# Patient Record
Sex: Male | Born: 1965
Health system: Southern US, Community
[De-identification: ages and names within clinical notes are randomized; demographics above are authoritative.]

## PROBLEM LIST (undated history)

## (undated) DIAGNOSIS — J449 Chronic obstructive pulmonary disease, unspecified: Secondary | ICD-10-CM

## (undated) DIAGNOSIS — J45909 Unspecified asthma, uncomplicated: Secondary | ICD-10-CM

## (undated) HISTORY — PX: KNEE ARTHROSCOPY: SUR90

## (undated) HISTORY — PX: HERNIA REPAIR: SHX51

## (undated) HISTORY — PX: OTHER SURGICAL HISTORY: SHX169

## (undated) HISTORY — PX: TONSILLECTOMY: SUR1361

---

## 2002-08-10 ENCOUNTER — Emergency Department (HOSPITAL_COMMUNITY): Admission: EM | Admit: 2002-08-10 | Discharge: 2002-08-10 | Payer: Self-pay | Admitting: Emergency Medicine

## 2003-01-12 ENCOUNTER — Emergency Department (HOSPITAL_COMMUNITY): Admission: EM | Admit: 2003-01-12 | Discharge: 2003-01-12 | Payer: Self-pay | Admitting: *Deleted

## 2003-01-12 ENCOUNTER — Encounter: Payer: Self-pay | Admitting: *Deleted

## 2003-01-18 ENCOUNTER — Emergency Department (HOSPITAL_COMMUNITY): Admission: EM | Admit: 2003-01-18 | Discharge: 2003-01-18 | Payer: Self-pay | Admitting: Emergency Medicine

## 2003-01-25 ENCOUNTER — Encounter: Payer: Self-pay | Admitting: Emergency Medicine

## 2003-01-25 ENCOUNTER — Emergency Department (HOSPITAL_COMMUNITY): Admission: EM | Admit: 2003-01-25 | Discharge: 2003-01-25 | Payer: Self-pay | Admitting: Emergency Medicine

## 2003-02-09 ENCOUNTER — Encounter: Payer: Self-pay | Admitting: Internal Medicine

## 2003-02-09 ENCOUNTER — Ambulatory Visit (HOSPITAL_COMMUNITY): Admission: RE | Admit: 2003-02-09 | Discharge: 2003-02-09 | Payer: Self-pay | Admitting: Internal Medicine

## 2003-08-27 ENCOUNTER — Encounter: Payer: Self-pay | Admitting: Emergency Medicine

## 2003-08-27 ENCOUNTER — Emergency Department (HOSPITAL_COMMUNITY): Admission: AD | Admit: 2003-08-27 | Discharge: 2003-08-27 | Payer: Self-pay | Admitting: Emergency Medicine

## 2004-02-07 ENCOUNTER — Emergency Department (HOSPITAL_COMMUNITY): Admission: EM | Admit: 2004-02-07 | Discharge: 2004-02-07 | Payer: Self-pay | Admitting: Emergency Medicine

## 2004-02-14 ENCOUNTER — Emergency Department (HOSPITAL_COMMUNITY): Admission: EM | Admit: 2004-02-14 | Discharge: 2004-02-14 | Payer: Self-pay | Admitting: Emergency Medicine

## 2007-12-28 ENCOUNTER — Ambulatory Visit: Payer: Self-pay | Admitting: Vascular Surgery

## 2011-04-25 ENCOUNTER — Ambulatory Visit: Payer: Self-pay | Admitting: Family Medicine

## 2011-09-21 DIAGNOSIS — K219 Gastro-esophageal reflux disease without esophagitis: Secondary | ICD-10-CM | POA: Insufficient documentation

## 2012-03-11 DIAGNOSIS — G8929 Other chronic pain: Secondary | ICD-10-CM | POA: Insufficient documentation

## 2012-03-11 DIAGNOSIS — M25579 Pain in unspecified ankle and joints of unspecified foot: Secondary | ICD-10-CM

## 2012-05-15 DIAGNOSIS — M19171 Post-traumatic osteoarthritis, right ankle and foot: Secondary | ICD-10-CM | POA: Insufficient documentation

## 2012-07-09 DIAGNOSIS — E669 Obesity, unspecified: Secondary | ICD-10-CM | POA: Insufficient documentation

## 2012-08-05 DIAGNOSIS — Z981 Arthrodesis status: Secondary | ICD-10-CM | POA: Insufficient documentation

## 2013-08-28 ENCOUNTER — Observation Stay: Payer: Self-pay | Admitting: Orthopaedic Surgery

## 2013-08-28 ENCOUNTER — Ambulatory Visit: Payer: Self-pay | Admitting: Orthopaedic Surgery

## 2013-08-28 LAB — COMPREHENSIVE METABOLIC PANEL
Albumin: 3.6 g/dL (ref 3.4–5.0)
Alkaline Phosphatase: 94 U/L (ref 50–136)
Anion Gap: 6 — ABNORMAL LOW (ref 7–16)
BUN: 15 mg/dL (ref 7–18)
Bilirubin,Total: 0.4 mg/dL (ref 0.2–1.0)
Calcium, Total: 9.2 mg/dL (ref 8.5–10.1)
Chloride: 103 mmol/L (ref 98–107)
Co2: 29 mmol/L (ref 21–32)
Creatinine: 0.99 mg/dL (ref 0.60–1.30)
EGFR (African American): >60
EGFR (Non-African Amer.): >60
Glucose: 136 mg/dL — ABNORMAL HIGH (ref 65–99)
Osmolality: 279 (ref 275–301)
Potassium: 3.8 mmol/L (ref 3.5–5.1)
SGOT(AST): 25 U/L (ref 15–37)
SGPT (ALT): 39 U/L (ref 12–78)
Sodium: 138 mmol/L (ref 136–145)
Total Protein: 7.2 g/dL (ref 6.4–8.2)

## 2013-08-28 LAB — CBC
HCT: 43.1 % (ref 40.0–52.0)
HGB: 14.8 g/dL (ref 13.0–18.0)
MCH: 29.8 pg (ref 26.0–34.0)
MCHC: 34.2 g/dL (ref 32.0–36.0)
MCV: 87 fL (ref 80–100)
Platelet: 345 10*3/uL (ref 150–440)
RBC: 4.95 10*6/uL (ref 4.40–5.90)
RDW: 13.1 % (ref 11.5–14.5)
WBC: 14.8 10*3/uL — ABNORMAL HIGH (ref 3.8–10.6)

## 2013-09-12 ENCOUNTER — Inpatient Hospital Stay: Payer: Self-pay | Admitting: Internal Medicine

## 2013-09-12 LAB — CBC
HCT: 45.3 % (ref 40.0–52.0)
HGB: 15.1 g/dL (ref 13.0–18.0)
MCH: 28.9 pg (ref 26.0–34.0)
MCHC: 33.3 g/dL (ref 32.0–36.0)
MCV: 87 fL (ref 80–100)
Platelet: 315 10*3/uL (ref 150–440)
RBC: 5.22 10*6/uL (ref 4.40–5.90)
RDW: 12.8 % (ref 11.5–14.5)
WBC: 23 10*3/uL — ABNORMAL HIGH (ref 3.8–10.6)

## 2013-09-12 LAB — BASIC METABOLIC PANEL
Anion Gap: 8 (ref 7–16)
BUN: 16 mg/dL (ref 7–18)
Calcium, Total: 9.1 mg/dL (ref 8.5–10.1)
Chloride: 96 mmol/L — ABNORMAL LOW (ref 98–107)
Co2: 26 mmol/L (ref 21–32)
Creatinine: 0.92 mg/dL (ref 0.60–1.30)
EGFR (African American): >60
EGFR (Non-African Amer.): >60
Glucose: 125 mg/dL — ABNORMAL HIGH (ref 65–99)
Osmolality: 263 (ref 275–301)
Potassium: 3.9 mmol/L (ref 3.5–5.1)
Sodium: 130 mmol/L — ABNORMAL LOW (ref 136–145)

## 2013-09-12 LAB — TROPONIN I: Troponin-I: <0.02 ng/mL

## 2013-09-12 LAB — SODIUM, URINE, RANDOM: Sodium, Urine Random: 35 mmol/L (ref 20–110)

## 2013-09-12 LAB — OSMOLALITY, URINE: Osmolality: 420 mOsm/kg

## 2013-09-13 LAB — COMPREHENSIVE METABOLIC PANEL
Albumin: 3.1 g/dL — ABNORMAL LOW (ref 3.4–5.0)
Alkaline Phosphatase: 84 U/L (ref 50–136)
Anion Gap: 8 (ref 7–16)
BUN: 18 mg/dL (ref 7–18)
Bilirubin,Total: 0.5 mg/dL (ref 0.2–1.0)
Calcium, Total: 9.1 mg/dL (ref 8.5–10.1)
Chloride: 99 mmol/L (ref 98–107)
Co2: 27 mmol/L (ref 21–32)
Creatinine: 0.95 mg/dL (ref 0.60–1.30)
EGFR (African American): >60
EGFR (Non-African Amer.): >60
Glucose: 191 mg/dL — ABNORMAL HIGH (ref 65–99)
Osmolality: 275 (ref 275–301)
Potassium: 4.1 mmol/L (ref 3.5–5.1)
SGOT(AST): 20 U/L (ref 15–37)
SGPT (ALT): 36 U/L (ref 12–78)
Sodium: 134 mmol/L — ABNORMAL LOW (ref 136–145)
Total Protein: 7.1 g/dL (ref 6.4–8.2)

## 2013-09-13 LAB — CBC WITH DIFFERENTIAL/PLATELET
Basophil #: 0.1 10*3/uL (ref 0.0–0.1)
Basophil %: 0.4 %
Eosinophil #: 0 10*3/uL (ref 0.0–0.7)
Eosinophil %: 0 %
HCT: 42.3 % (ref 40.0–52.0)
HGB: 14.3 g/dL (ref 13.0–18.0)
Lymphocyte #: 0.6 10*3/uL — ABNORMAL LOW (ref 1.0–3.6)
Lymphocyte %: 2.4 %
MCH: 29.6 pg (ref 26.0–34.0)
MCHC: 33.9 g/dL (ref 32.0–36.0)
MCV: 87 fL (ref 80–100)
Monocyte #: 0.6 x10 3/mm (ref 0.2–1.0)
Monocyte %: 2.6 %
Neutrophil #: 23.1 10*3/uL — ABNORMAL HIGH (ref 1.4–6.5)
Neutrophil %: 94.6 %
Platelet: 311 10*3/uL (ref 150–440)
RBC: 4.84 10*6/uL (ref 4.40–5.90)
RDW: 13.3 % (ref 11.5–14.5)
WBC: 24.4 10*3/uL — ABNORMAL HIGH (ref 3.8–10.6)

## 2013-09-13 LAB — TSH: Thyroid Stimulating Horm: 0.209 u[IU]/mL — ABNORMAL LOW

## 2013-09-13 LAB — MAGNESIUM: Magnesium: 1.9 mg/dL

## 2013-09-14 LAB — CBC WITH DIFFERENTIAL/PLATELET
Basophil #: 0.1 10*3/uL (ref 0.0–0.1)
Basophil %: 0.6 %
Eosinophil #: 0.1 10*3/uL (ref 0.0–0.7)
Eosinophil %: 0.6 %
HCT: 42.7 % (ref 40.0–52.0)
HGB: 14.2 g/dL (ref 13.0–18.0)
Lymphocyte #: 1.7 10*3/uL (ref 1.0–3.6)
Lymphocyte %: 14.7 %
MCH: 29.3 pg (ref 26.0–34.0)
MCHC: 33.2 g/dL (ref 32.0–36.0)
MCV: 88 fL (ref 80–100)
Monocyte #: 0.5 x10 3/mm (ref 0.2–1.0)
Monocyte %: 4.4 %
Neutrophil #: 9.3 10*3/uL — ABNORMAL HIGH (ref 1.4–6.5)
Neutrophil %: 79.7 %
Platelet: 279 10*3/uL (ref 150–440)
RBC: 4.85 10*6/uL (ref 4.40–5.90)
RDW: 13.5 % (ref 11.5–14.5)
WBC: 11.6 10*3/uL — ABNORMAL HIGH (ref 3.8–10.6)

## 2013-09-15 LAB — EXPECTORATED SPUTUM ASSESSMENT W REFEX TO RESP CULTURE

## 2013-09-15 LAB — T4, FREE: Free Thyroxine: 1.4 ng/dL (ref 0.76–1.46)

## 2013-09-17 LAB — CULTURE, BLOOD (SINGLE)

## 2013-11-04 ENCOUNTER — Ambulatory Visit: Payer: Self-pay | Admitting: Family Medicine

## 2013-11-26 ENCOUNTER — Emergency Department: Payer: Self-pay | Admitting: Emergency Medicine

## 2014-06-02 IMAGING — CT CT CHEST W/ CM
2 series · 15 of 31 positions shown, 19 images · IV contrast (agent unspecified)
Comparison: none

REASON FOR EXAM: pleuritic c,p pleural based density, no fever, hi wbc
count but just finished pr
COMMENTS:

PROCEDURE:     CT  - CT CHEST WITH CONTRAST  - September 12, 2013  [DATE]
RESULT:     Chest CT dated 09/12/2013.
TECHNIQUE: Helical 3 mm sections were obtained from the thoracic inlet
through the lung bases status post intravenous administration of 100 mL of
Ssovue-WJ2.

[Series 5: soft tissue · axial · 0.80mm/px · z∈[-42,+4]mm · 2 of 100 slices shown]
[im 8/100  mediastinal]
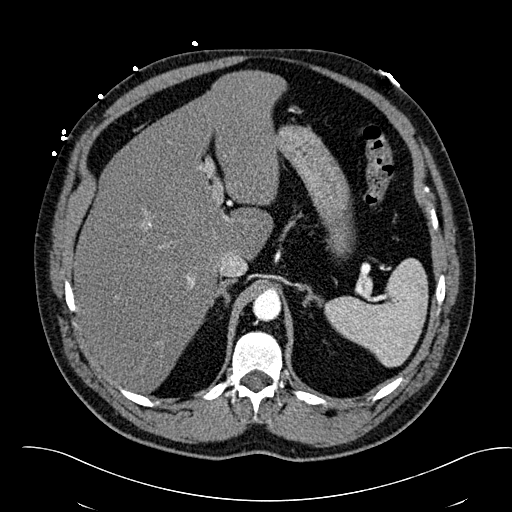
[im 23/100  mediastinal]
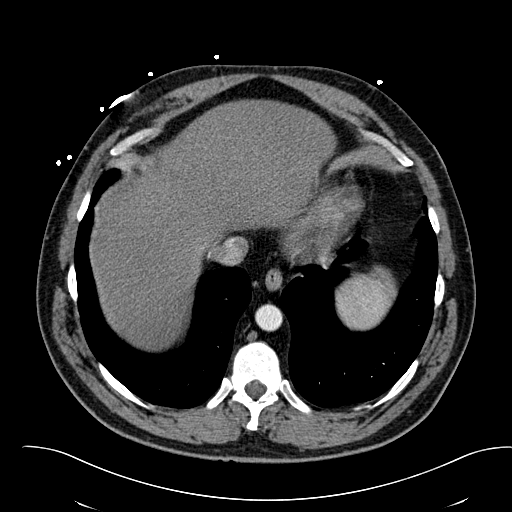

[Series 6: lung windows · axial · 0.80mm/px · z∈[-32,+210]mm · 13 of 97 slices shown, 17 images]
[im 8/97  mediastinal]
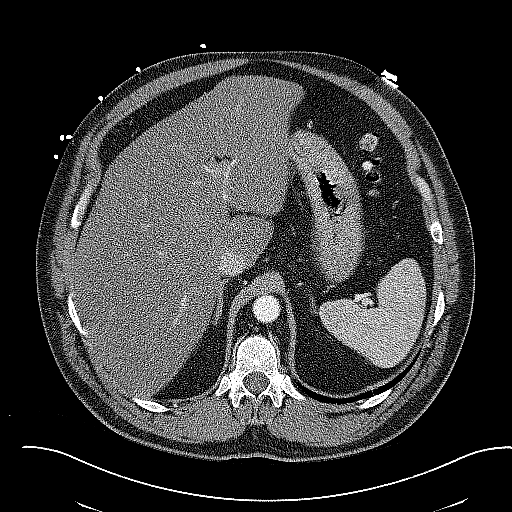
[im 8/97  lung]
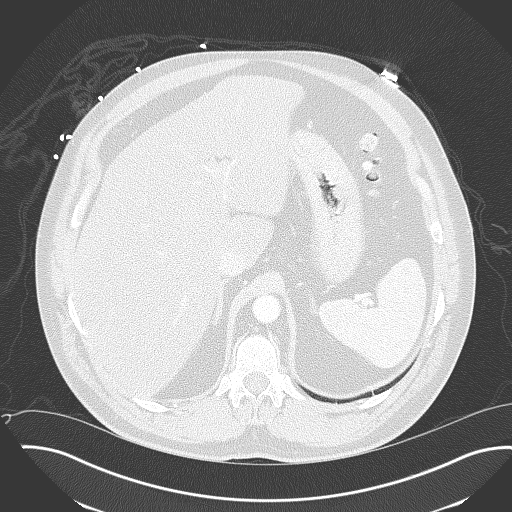
[im 15/97  lung]
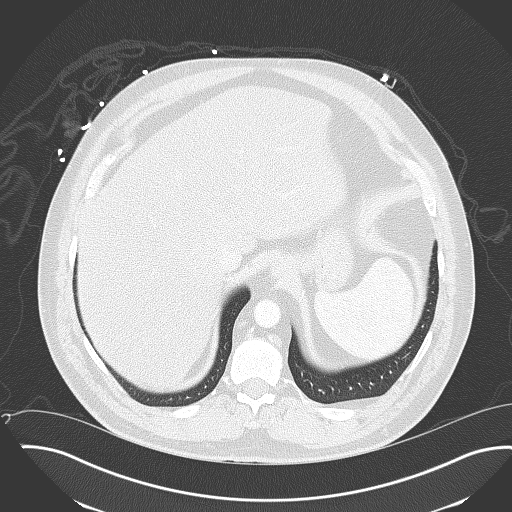
[im 23/97  lung]
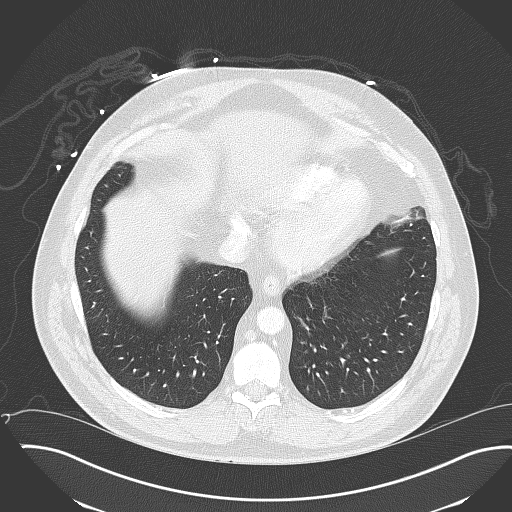
[im 30/97  lung]
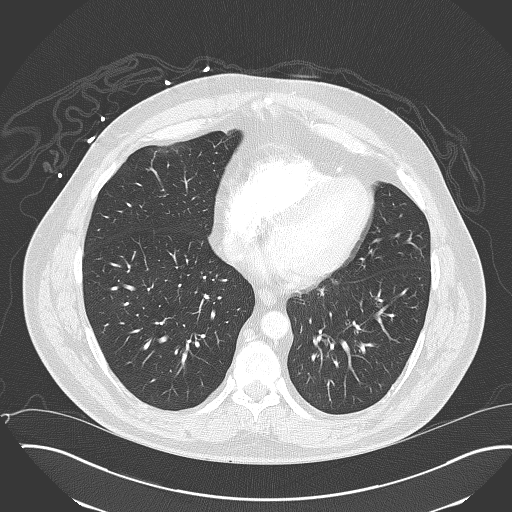
[im 37/97  mediastinal]
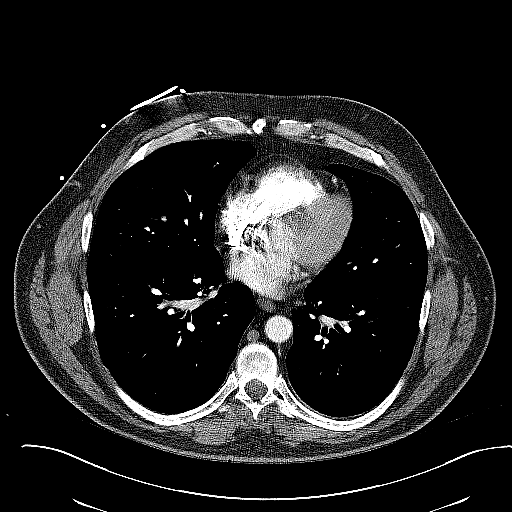
[im 37/97  lung]
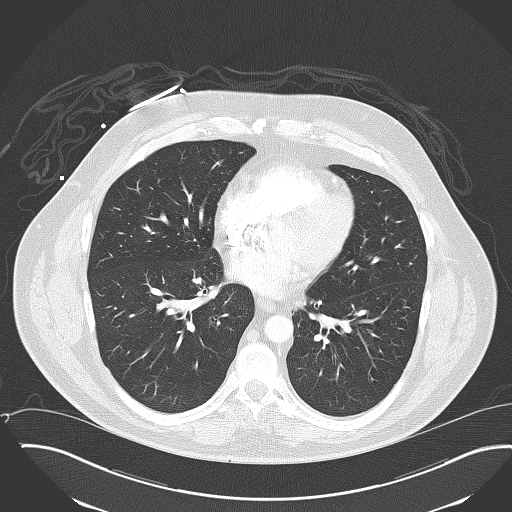
[im 45/97  lung]
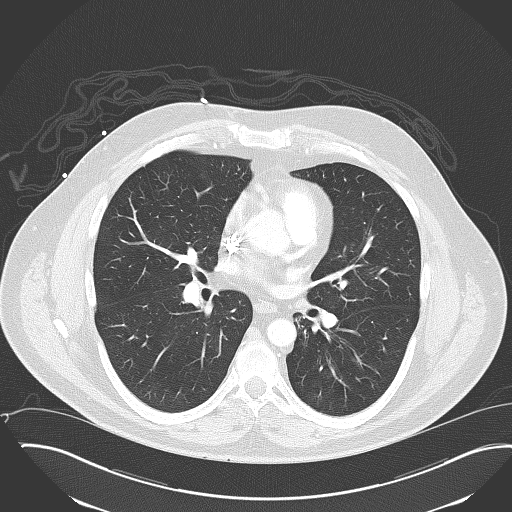
[im 49/97  lung]
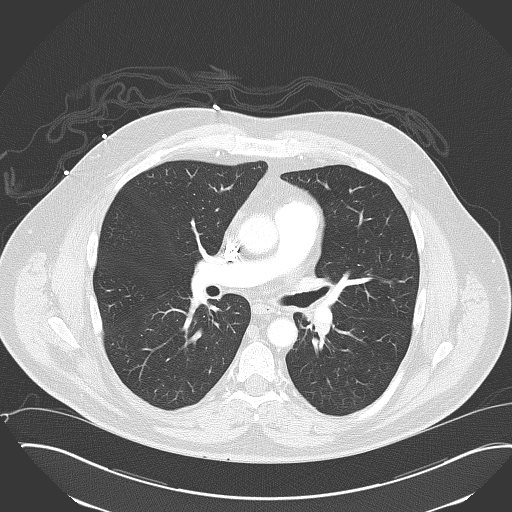
[im 52/97  lung]
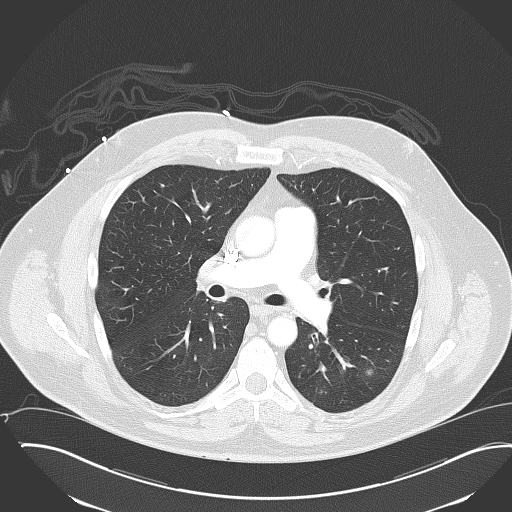
[im 60/97  mediastinal]
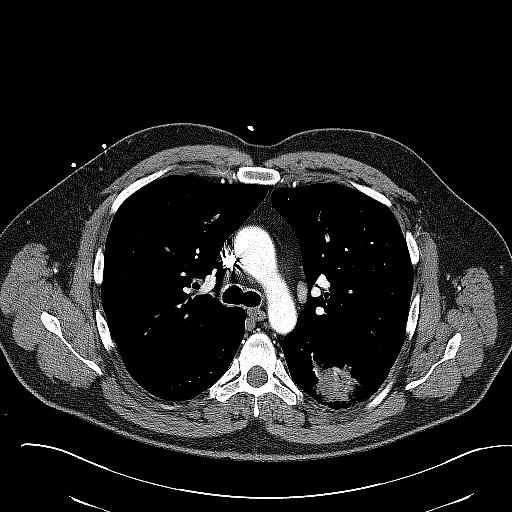
[im 60/97  lung]
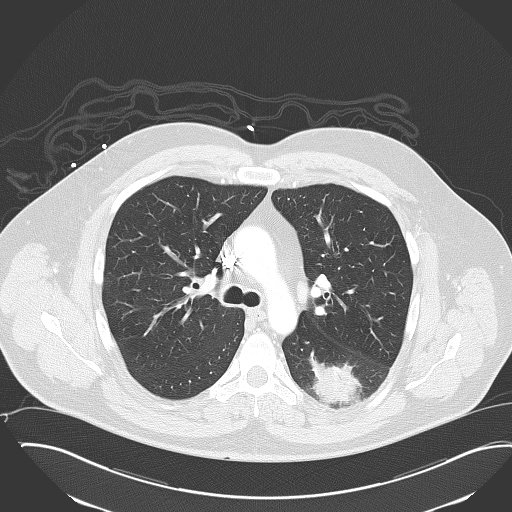
[im 67/97  lung]
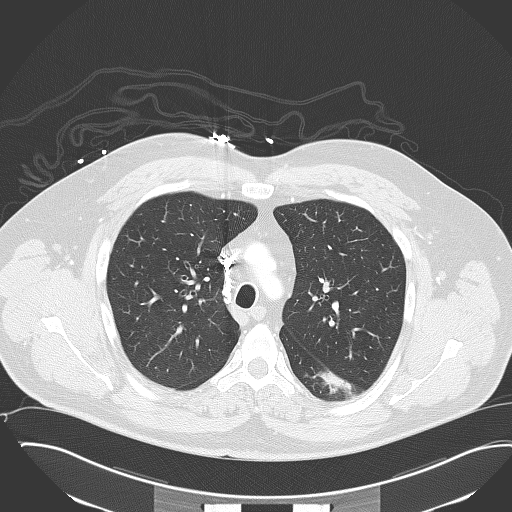
[im 74/97  lung]
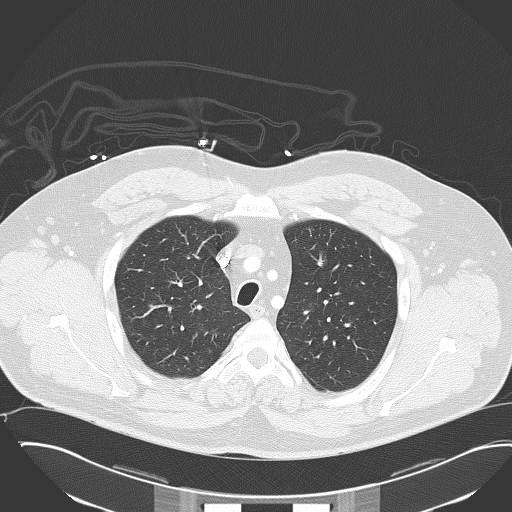
[im 82/97  lung]
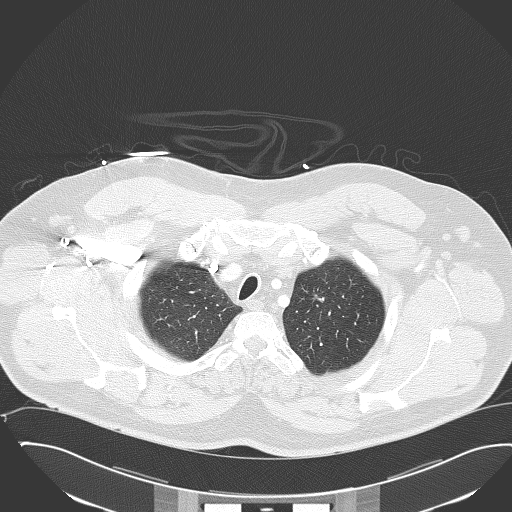
[im 89/97  mediastinal]
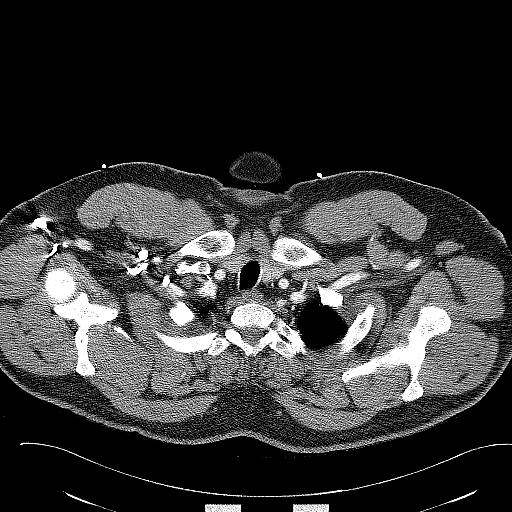
[im 89/97  lung]
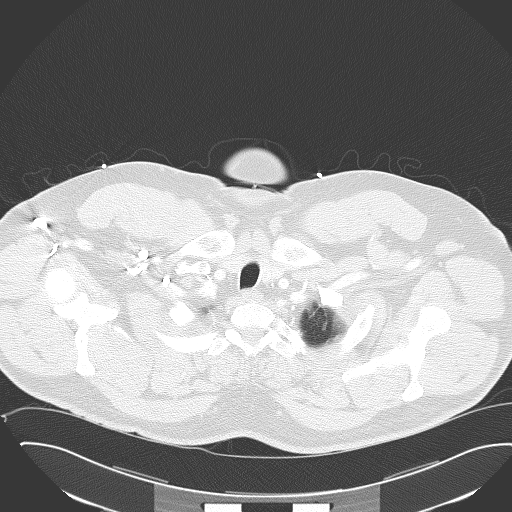

[15 of 31 positions shown; findings below may reference images not displayed]

FINDINGS: The mediastinum and hilar regions and structures demonstrate no
evidence of mediastinal nor hilar masses. A lymph nodes identified within
the left superhilar region measuring 7.7 mm in short axis.

There is no evidence of filling defects within the main, lobar, or segmental
pulmonary arteries.

Within the apex of the apical segment left lower lobe a 2.66 x 2.5 cm
density is appreciated. This finding is measured on image #35 of the lung
series. A small 7 mm groundglass nodule projects within the lateral central
portion of the left upper lobe on image #34 of the lung windows. A 5.6 mm in
tube projects within the  posterior lateral aspect of the left lower lobe
image #64 the lung series. An 8.4 mm nodule projects within the medial
aspect of the superior segment left lower lobe image #56 lung series a
groundglass appearing density projects within the medial aspect anteriorly
within the base of the left lower lobe Image #71.

Visualized upper abdominal viscera demonstrates diffuse low-attenuation
throughout the liver an otherwise unremarkable.
IMPRESSION: Dominant area of consolidative density within the apex of
the superior segment left lower lobe. Multiple smaller nodules are scattered
within the left lung primarily within the lower lobe. Different
considerations are infectious versus inflammatory infiltrate particularly
considering the patient's history of recent steroid therapy. More ominous
etiologies cannot be excluded an further evaluation with pulmonology
consultation is recommended. Surveillance evaluation status post appropriate
therapeutic regiment is also recommended.
2. No CT evidence of pulmonary arterial embolic disease.

## 2014-06-02 IMAGING — CR DG CHEST 2V
1 series · 2 of 2 positions shown · non-contrast
Comparison: none

REASON FOR EXAM: Shortness of breath
COMMENTS:

PROCEDURE:     DXR - DXR CHEST PA (OR AP) AND LATERAL  - September 12, 2013 [DATE]
RESULT:

[Series 1: w chest pa · 0.14mm/px · 2 of 2 slices shown]
[im 1/2]
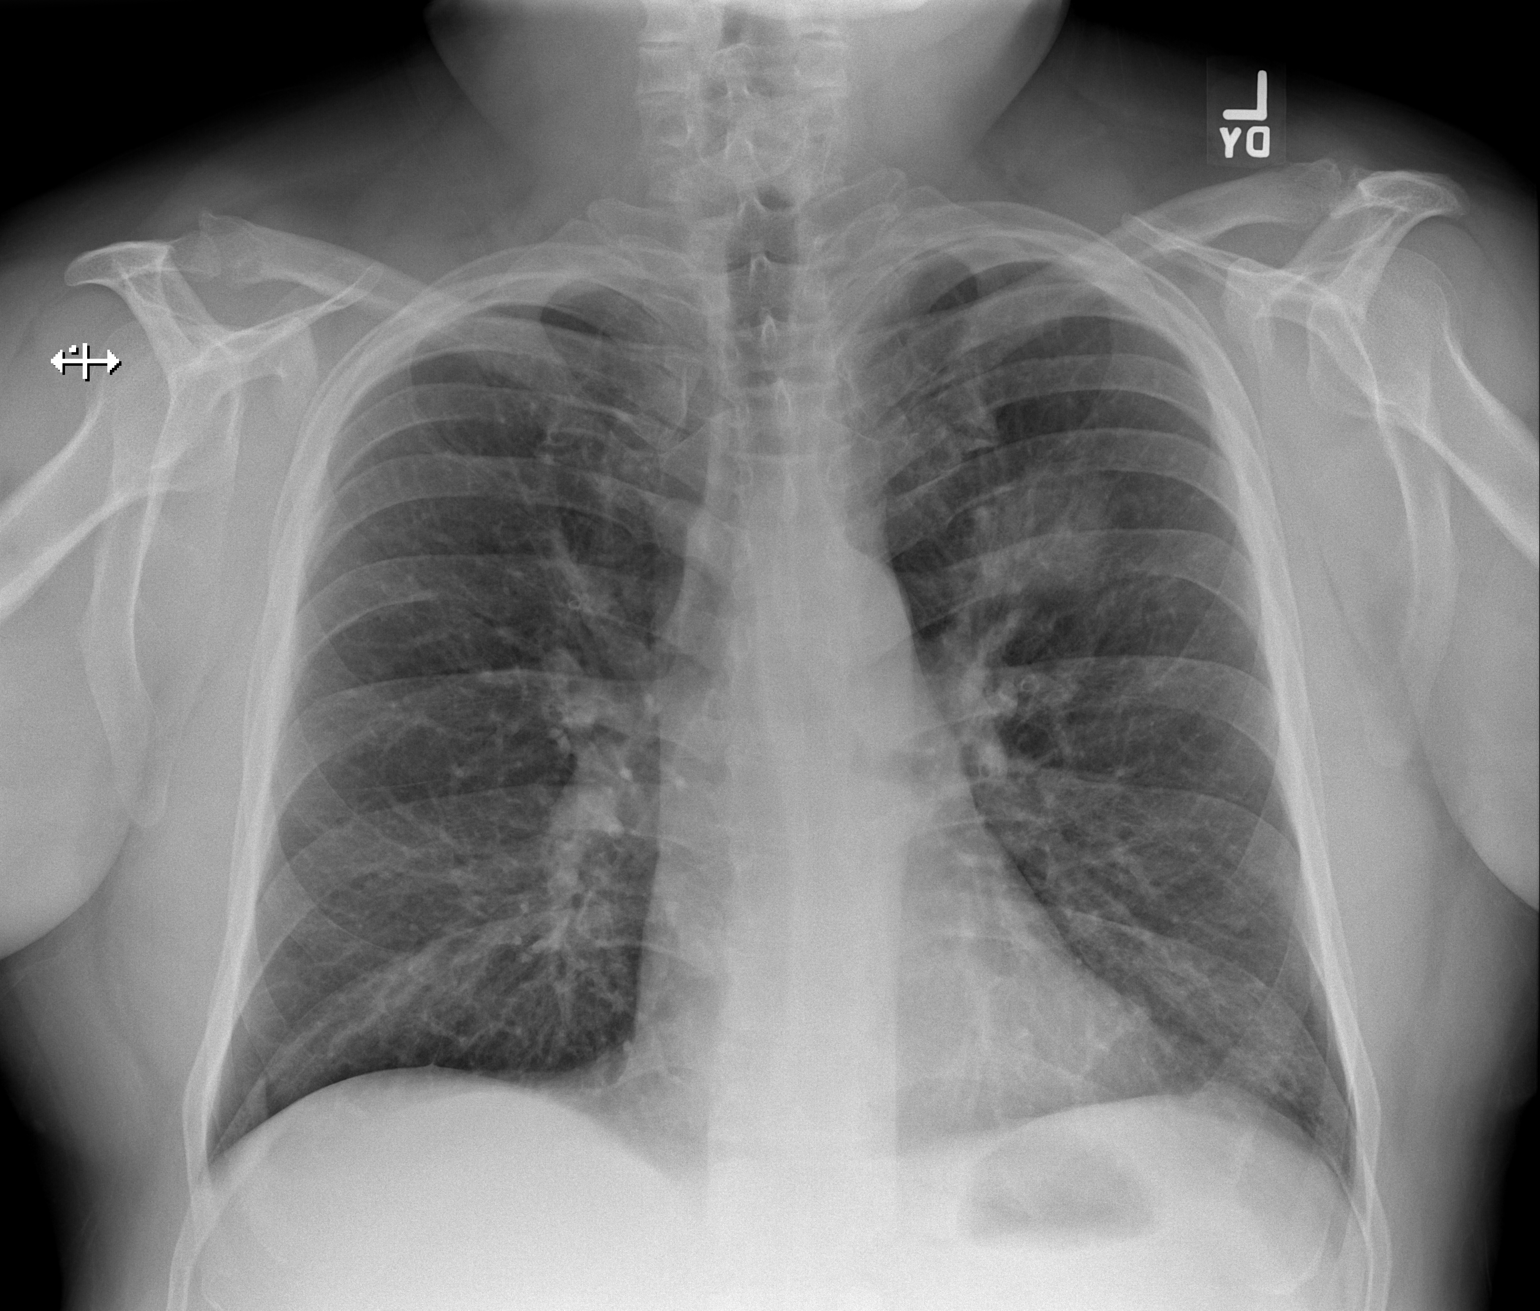
[im 2/2]
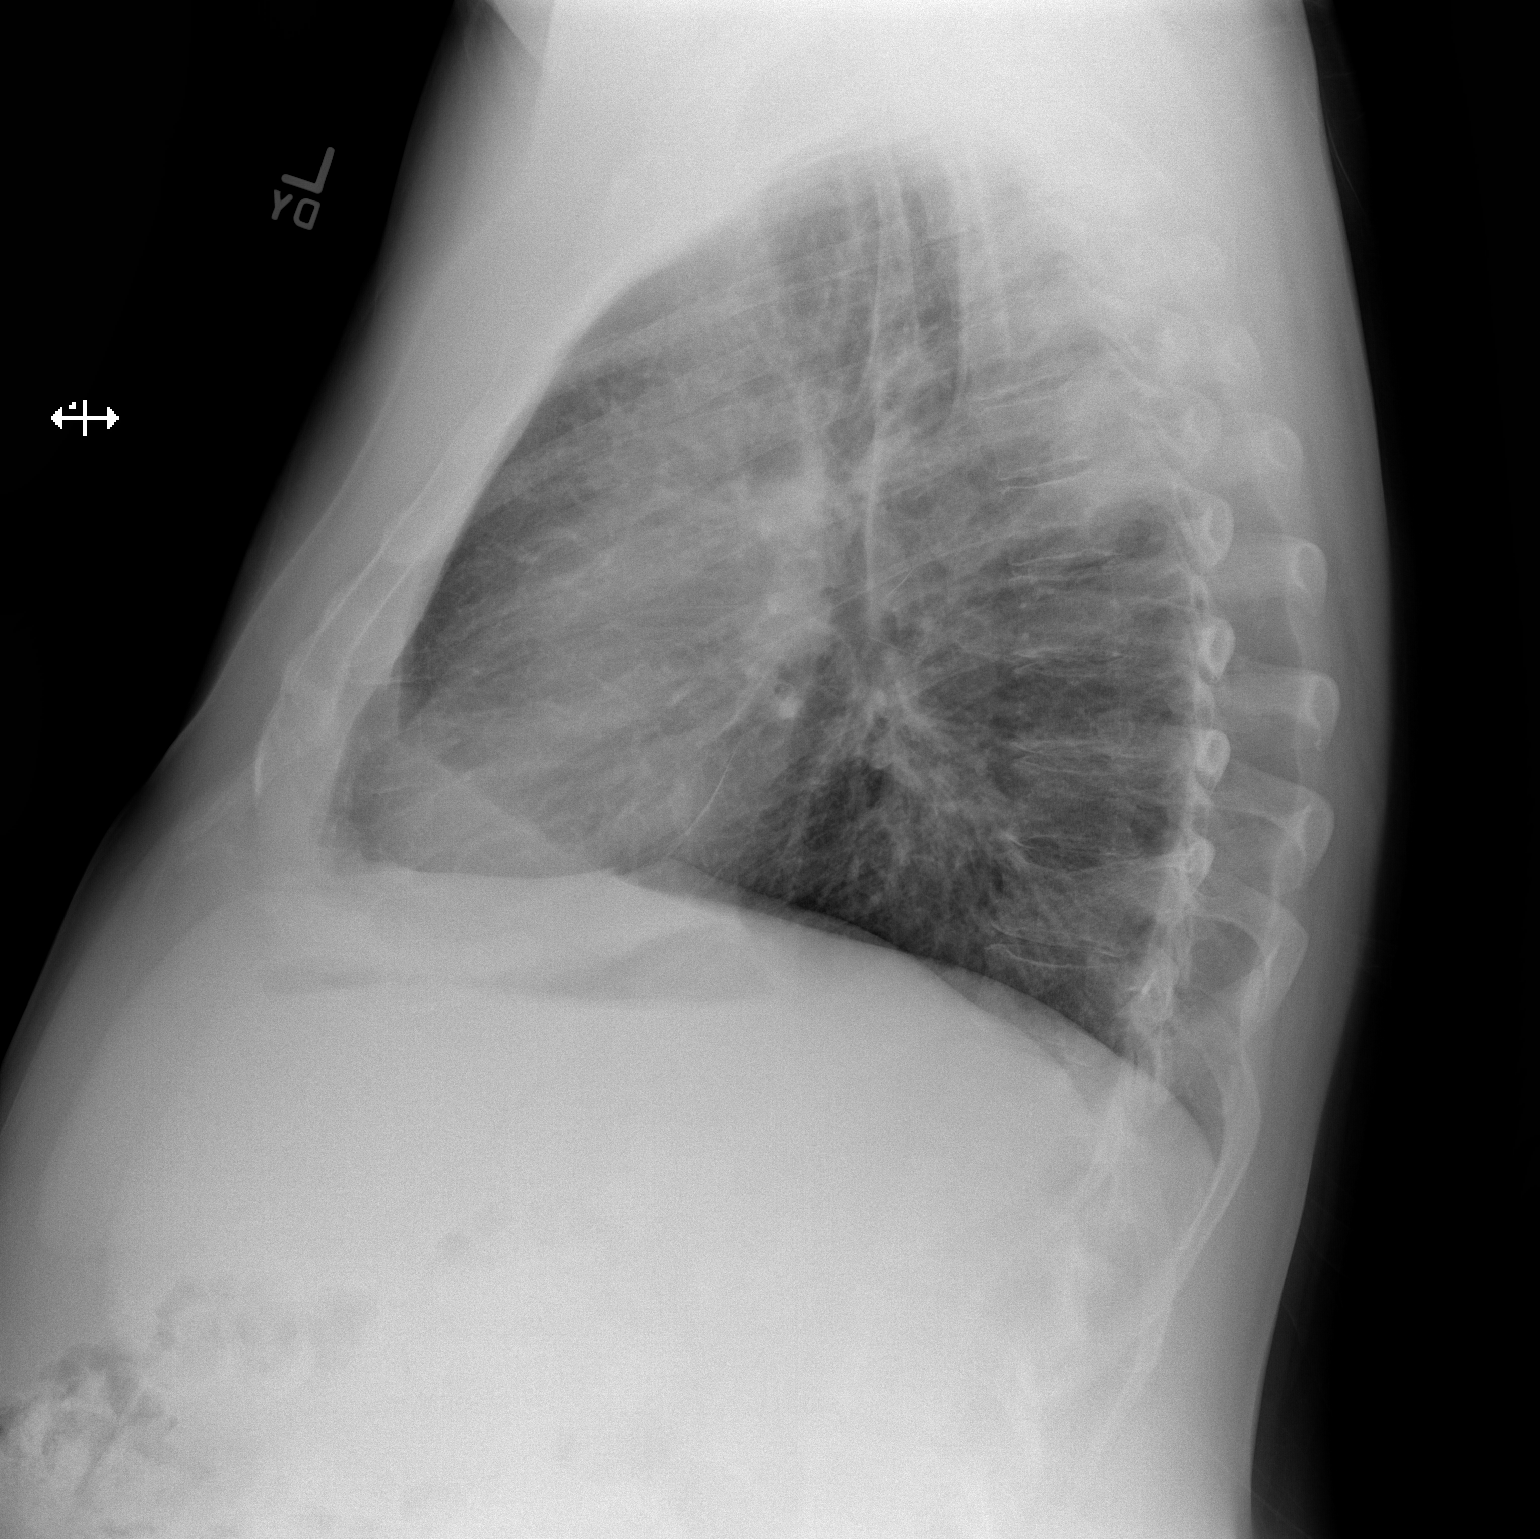

[2 of 2 positions shown; findings below may reference images not displayed]

FINDINGS: A rounded area of increased density projects within the posterior
aspect of the left upper lobe. This is appreciated in a suprahilar lateral
position on the frontal view. The cardiac silhouette is within normal
limits. The visualized bony skeleton is unremarkable. There is mild
prominence of the interstitial markings.
IMPRESSION: 1. Infiltrate projecting within the posterior aspect of the left upper lobe.
Repeat surveillance is recommended status post appropriate therapeutic
regimen. If this area persist status post appropriate therapy, further
evaluation with Chest CT and pulmonology consultation is recommended.

## 2014-06-04 IMAGING — CR DG CHEST 1V PORT
1 series · 1 of 1 positions shown · non-contrast
Comparison: none

REASON FOR EXAM: pneumonia f/u
COMMENTS:

[ap]
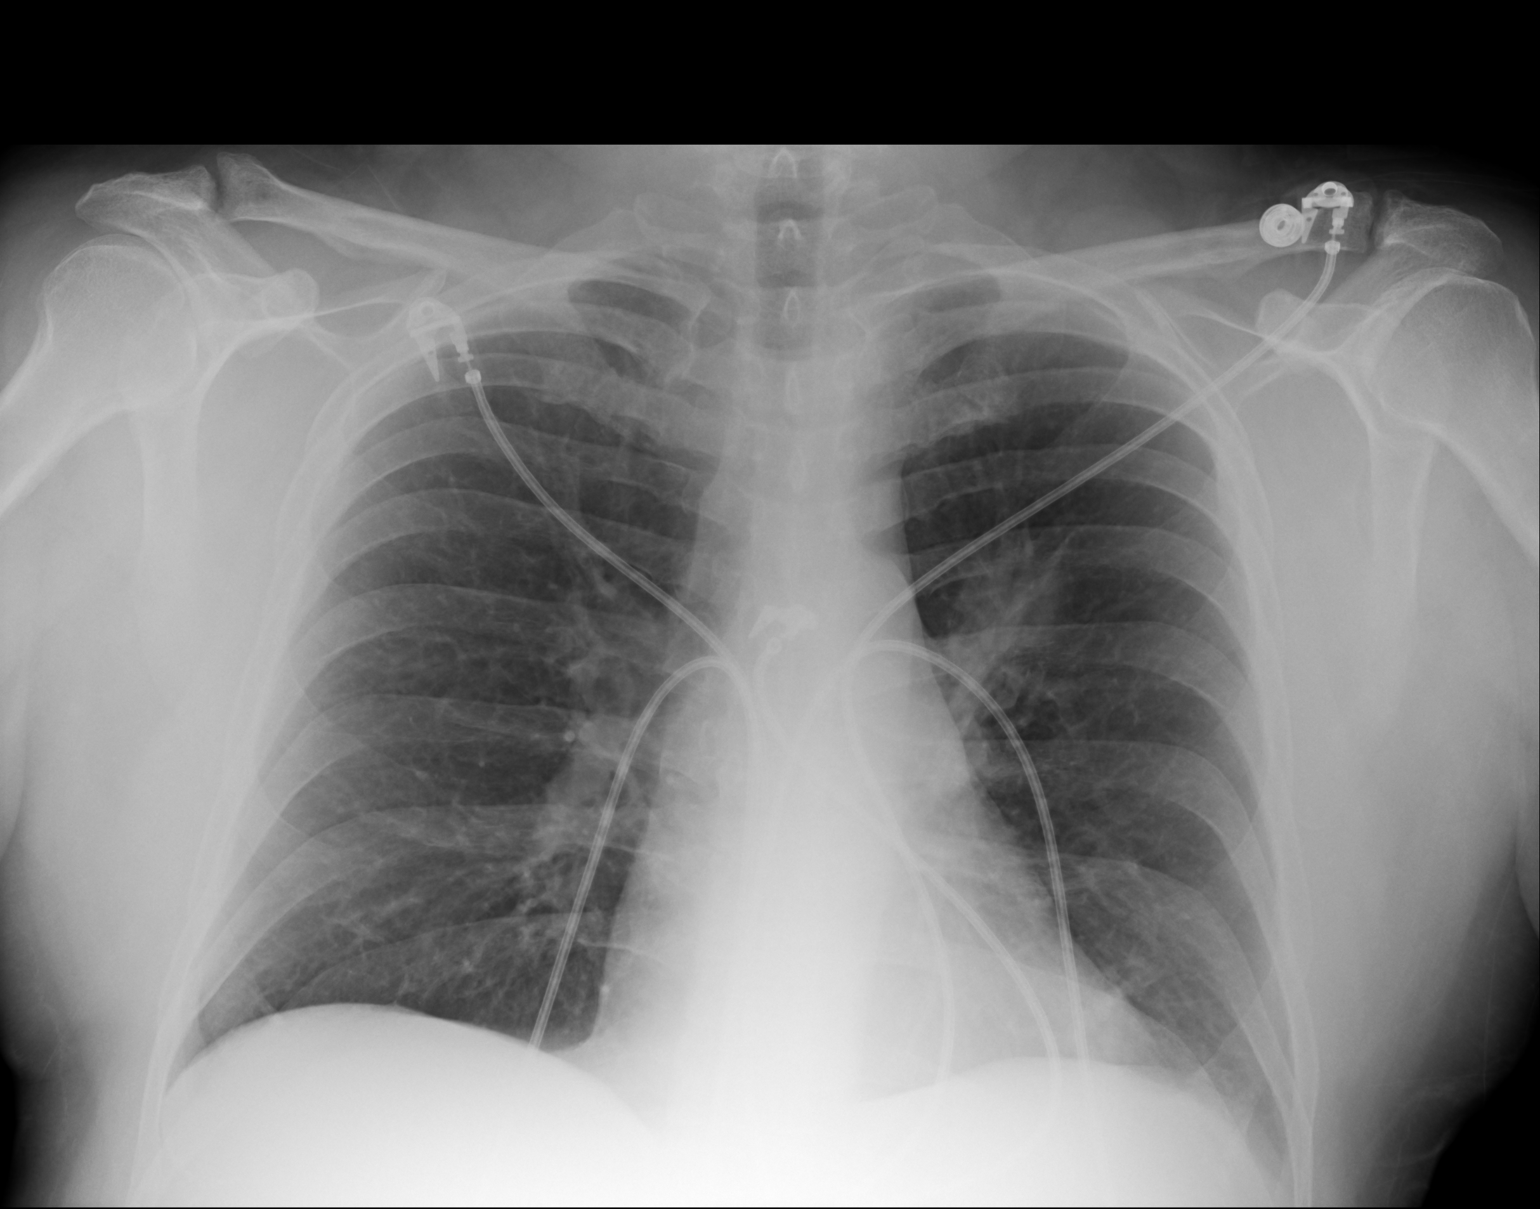

[1 of 1 positions shown; findings below may reference images not displayed]

PROCEDURE:     DXR - DXR PORTABLE CHEST SINGLE VIEW  - September 14, 2013  [DATE]

RESULT:     Comparison is made to the study dated 09/12/2013.

There is some persistent hazy density in the left superhilar region. This is
less prominent compare to the previous exam. The lungs are otherwise clear
and appear fully inflated. Cardiac monitoring electrodes present. Cardiac
silhouette appears unremarkable. The bony and mediastinal structures appear
unremarkable.
IMPRESSION: Middle residual infiltrate in the left lung.

[REDACTED]

## 2014-10-11 ENCOUNTER — Ambulatory Visit: Payer: Self-pay

## 2014-11-14 ENCOUNTER — Emergency Department: Payer: Self-pay | Admitting: Emergency Medicine

## 2015-02-16 ENCOUNTER — Ambulatory Visit: Payer: Self-pay

## 2015-04-21 NOTE — Discharge Summary (Signed)
PATIENT NAME:  Eric Lyons, Eric Lyons MR#:  [id number] DATE OF BIRTH:  1966/09/24  DATE OF ADMISSION:  09/12/2013 DATE OF DISCHARGE:  09/16/2013  PRIMARY CARE PHYSICIAN: Nonlocal.   CONSULTING PHYSICIAN: Dr. Belia HemanKasa.   DISCHARGE DIAGNOSES: 1.  Pneumonia. 2.  Asthma exacerbation.   CONDITION: Stable.   CODE STATUS: FULL CODE.   HOME MEDICATIONS:  1.  Ergocalciferol 50,000 international units p.o. capsule 1 capsule once a week. 2.  Vitamin C 500 mg p.o. 2 times a day. 3.  Ventolin HFA CFC free 90 mcg inhalation aerosol 2 puffs 4 times a day p.r.n. 4.  Spiriva 18 mcg 1 cap inhaled once a day.  5.  Levaquin 500 mg p.o. daily for 7 days.  6.  Prednisone 40 mg p.o. daily for 2 days then taper.  7.  Guaifenesin 600 mg p.o. tablets, extended release, q. 12 hours p.r.n. for cough for 7 days.  8.  Albuterol CFC free 90 mcg inhalation 2 puffs 4 times a day p.r.n. for shortness of breath.   DIET: Low sodium, low fat, low cholesterol.  ACTIVITY: As tolerated.   FOLLOW-UP CARE: Follow up with PCP within 1 to 2 weeks   REASON FOR ADMISSION: Increased shortness of breath, fever, sweating.  HOSPITAL COURSE: The patient is a 49 year old Caucasian male with a history of asthma who presented to the ED with increased shortness of breath, fevers and sweating. The patient has a history of burning some poison oak 2 weeks ago before this admission. The patient started to have wheezing, shortness of breath and cough. He was seen by an urgent care and was treated with some steroids, but the patient developed worsening cough and shortness of breath and wheezing. Chest x-ray in the ED showed left upper lobe mass versus infiltrate.  For detailed history and physical examination, please refer to the admission note dictated by Dr. Mordecai MaesSanchez. On admission date, the patient's sodium was 130, WBC 23 and 24.4. After admission the patient has been treated with antibiotics including Zosyn and Levaquin. In addition, the patient has  been treated with oxygen, nebulizer, Advair and Spiriva. Dr. Belia HemanKasa evaluated the patient and suggested add Pulmicort. The patient had repeated chest x-ray that showed improving infiltrate in the left lung. The patient's white count decreased to 11.6, sodium increased to 134.   For asthma exacerbation, the patient's wheezing was worse yesterday so the patient was treated with Solu-Medrol IVPB. Today the patient's wheezing has much improved. He only has mild cough, but no shortness of breath or wheezing. The patient is clinically stable and will be discharged to home today. I discussed the patient's discharge plan with the patient, case manager and nurse.   TIME SPENT: About 36 minutes.  ____________________________ Eric PollackQing Jaedyn Lard, MD qc:sb D: 09/16/2013 15:17:38 ET T: 09/16/2013 16:34:16 ET JOB#: [id number]  cc: Eric PollackQing Anelia Carriveau, MD, <Dictator> Eric PollackQING Kyzer Blowe MD ELECTRONICALLY SIGNED 09/17/2013 16:59

## 2015-04-21 NOTE — Discharge Summary (Signed)
Dates of Admission and Diagnosis:  Date of Admission 28-Aug-2013   Date of Discharge 29-Aug-2013   Admitting Diagnosis Grade 1 open left distal radius fx   Final Diagnosis same   Discharge Diagnosis 1 ulnar nerve parasthesia    Chief Complaint/History of Present Illness Fall from ladder; open Left distal radius fx with parasthesia on initial exam. brought to OR for I and D and ORIF of the distal radius   PERTINENT RADIOLOGY STUDIES: XRay:    30-Aug-14 19:08, Wrist Left Complete  Wrist Left Complete   REASON FOR EXAM:    injury, pain  COMMENTS:       PROCEDURE: DXR - DXR WRIST LT COMP WITH OBLIQUES  - Aug 28 2013  7:08PM     RESULT: Four views of the left wrist reveal patient have sustained an   acute fracture of the distal left radial metaphysis.There is angulation   and displacement of the distal fracture fragment. The adjacent ulna is   grossly intact. The carpal bones also appear intact as do the metacarpal   bases.    IMPRESSION:  The patient sustained an angulated and displaced fractureof   the distal left radial metaphysis.     Dictation Site: 1    Verified By: DAVID A. [place], M.D., MD    30-Aug-14 19:57, Forearm Left  Forearm Left   REASON FOR EXAM:    fall, pain  COMMENTS:       PROCEDURE: DXR - DXR FOREARM LEFT  - Aug 28 2013  7:57PM     RESULT: AP and lateral views of the left forearm reveal a displaced   fracture of the distal right radial metaphysis. More proximally the shaft   of the radius is intact and the ulna appears intact. The observed   portions of the elbow are within the limits of normal for age. There is a   tiny olecranon spur.    IMPRESSION:  There is an acute fracture of the distal aspect of the left   radius. The ulna appears intact.     Dictation Site: 1    Verified By: DAVID A. [place], M.D., MD    30-Aug-14 19:58, Elbow Left Complete  Elbow Left Complete   REASON FOR EXAM:    pain, forearm fracture  COMMENTS:       PROCEDURE:  DXR - DXR ELBOW LT COMP W/OBLIQUES  - Aug 28 2013  7:58PM     RESULT: Four views of the left elbow reveal the bones to be adequately   mineralized. There is no evidence of an acute fracture or dislocation.   There is no joint effusion. There is a tiny olecranon spur.    IMPRESSION:  There is no acute bony abnormality of the left elbow.     Dictation Site: 1        Verified By: DAVID A. [place], M.D., MD    31-Aug-14 01:55, Wrist Left AP and Lat  Wrist Left AP and Lat   REASON FOR EXAM:    post op in PACU  COMMENTS:       PROCEDURE: DXR - DXR WRIST LEFT AP AND LATERAL  - Aug 29 2013  1:55AM     RESULT: 3 postoperative films in a splint reveal the patient to have   undergone ORIF for a distal left radial fracture. Alignment of the   fracture fragments is near-anatomic. The adjacent ulna is intact. The   carpal bones appear intact.    IMPRESSION:  The patient  has undergone ORIF for distal radial metaphyseal   fracture. Further interpretation is deferred to Dr. Lavella LemonsGallizi.     Dictation Site: 1    Verified By: DAVID A. [place]andJORDAN, M.D., MD   Hospital Course:  Hospital Course Pt was admitted from the Er brought to the OR for an I and D and ORID of Left distal radius; found to be imprving in his pain and parasthesia on POD#1; Pt would like to head home as he has an indoor pet that needs to be cared for; will send home after last dose of IV antibiotics are given   Condition on Discharge Good   DISCHARGE INSTRUCTIONS HOME MEDS:  Medication Reconciliation: Patient's Home Medications at Discharge:     Medication Instructions  ventolin hfa cfc free 90 mcg/inh inhalation aerosol  2 puff(s) inhaled 4 times a day, As Needed   acetaminophen-hydrocodone 325 mg-7.5 mg oral tablet  1 tab(s) orally every 6 hours, As needed, pain   gabapentin 400 mg oral capsule  1 cap(s) orally 2 times a day    PRESCRIPTIONS: PRINTED AND GIVEN TO PATIENT/FAMILY   Physician's Instructions:  Home Health? No    Treatments None   Dressing Care Keep dressing dry   Home Oxygen? No   Diet Regular   Diet Consistency Regular Consistency   Activity Limitations No heavy lifting  Non weight bearing LUE   Return to Work after follow up visit with MD   Time frame for Follow Up Appointment 1-2 weeks  Dr. Claris Gladdenamasunder   Electronic Signatures: Weyman PedroGallizzi, Dontray Haberland A (MD)  (Signed 31-Aug-14 09:31)  Authored: ADMISSION DATE AND DIAGNOSIS, CHIEF COMPLAINT/HPI, PERTINENT RADIOLOGY STUDIES, HOSPITAL COURSE, DISCHARGE INSTRUCTIONS HOME MEDS, PATIENT INSTRUCTIONS   Last Updated: 31-Aug-14 09:31 by Weyman PedroGallizzi, Terrence Wishon A (MD)

## 2015-04-21 NOTE — H&P (Signed)
   Subjective/Chief Complaint Left wrist Pain   History of Present Illness 49 y/o RHD man who fell off laddle of a pontoon boat today 2 hrs ago; his last meal was 2.5 hrs ago.  his pain is located in his left wist; c/o numbness worse on the 5th digit improving to the thumb. pain rated 10/10 achy sharp, worse with movement better with rest; states that he noticed a bone poking and some bubbles on the ulnar side.   Past History Asthma and seasonal allgeries   Past Med/Surgical Hx:  Asthma:   poison oak "in my lungs":   Hernia Repair:   Tonsillectomy:   Ankle Surgery - Right:   ALLERGIES:  Fentanyl: N/V/Diarrhea  HOME MEDICATIONS: Medication Instructions Status  Ventolin HFA CFC free 90 mcg/inh inhalation aerosol 2 puff(s) inhaled 4 times a day, As Needed Active   Family and Social History:  Family History ALS dad; mom etoh   Social History negative tobacco, negative ETOH, negative Illicit drugs   Place of Living Home   Review of Systems:  Fever/Chills No   Cough Yes   Sputum Yes   Abdominal Pain No   Diarrhea No   Constipation No   Nausea/Vomiting No   SOB/DOE No   Chest Pain No   Dysuria No   Tolerating Diet Yes   Physical Exam:  GEN well developed, in moderate distress   NECK supple   RESP normal resp effort   CARD tachycardia   Additional Comments LUE; decreased sensation to the 5th digit improving to the thumb side; able to wiggle fingers, thumbs up; weal abduction, make a weak fist; limited by pain; +2 radial pulse, small 0.5cm poke hole on the ulnar side of wrist  XR reviewed of elbow forearm and wrist; demonstrate a 90 percent displaced distal radius dorsasl displacement, non intraarticular    Assessment/Admission Diagnosis type I open Left distal radius fracture - tetnus UTD as of last friday - given 1 gram kefzol within 2 hrs of injury - non weight bearing - spoke with anesthesia and plan for ORIF Left distal radius with I and D risks and  benifits of operative vs nonoperative treatment were discussed. Pt understands the risks and benifits and wishes to proceed. 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   Plan - tetnus UTD as of last friday - given 1 gram kefzol within 2 hrs of injury - non weight bearing - spoke with anesthesia and plan for ORIF Left distal radius with I and D Pt will be admitted overnight and discharged after 24 hrs of antibiotics risks and benifits of operative vs nonoperative treatment were discussed. Pt understands the risks and benifits and wishes to proceed. 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   Electronic Signatures: Weyman PedroGallizzi, Caine Barfield A (MD)  (Signed [id number]-Aug-14 20:46)  Authored: CHIEF COMPLAINT and HISTORY, PAST MEDICAL/SURGIAL HISTORY, ALLERGIES, HOME MEDICATIONS, FAMILY AND SOCIAL HISTORY, REVIEW OF SYSTEMS, PHYSICAL EXAM, ASSESSMENT AND PLAN   Last Updated: 30-Aug-14 20:46 by Weyman PedroGallizzi, Diya Gervasi A (MD)

## 2015-04-21 NOTE — Op Note (Signed)
PATIENT NAME:  Eric Lyons, Eric Lyons MR#:  [id number] DATE OF BIRTH:  1966/06/02  DATE OF PROCEDURE:  08/28/2013  PREOPERATIVE DIAGNOSIS:  Left grade1 open fracture of the distal radius.  POSTOPERATIVE DIAGNOSIS:  Left grade1 open fracture of the distal radius.  PROCEDURE PERFORMED: Irrigation and debridement of left open fracture and open reduction/internal fixation of left distal radius.  SURGEON:  Dr. Danton SewerMichael Cachet Mccutchen  FINDINGS:  Included a 2 mm ulnar-sided inside-out poke hole, likely from the ulnar styloid, and a severely comminuted distal radius fracture, 90% displaced dorsally.  ANESTHESIA:  General.  FLUIDS:  Per Anesthesia.  ESTIMATED BLOOD LOSS:  25 mL.  DRAINS:  None.  SPECIMENS:  None.  COMPLICATIONS:  None.  CONDITION:  Stable to recovery room.  INDICATION FOR PROCEDURE:  Mr. Eric Lyons is a 49 year old, right-hand dominant man, who fell off a ladder while getting into a pontoon boat yesterday. His friend subsequently brought him to [facility], where the ER consulted Orthopedics for evaluation. Upon physical exam, the patient had a small, ulnar-sided poke wound that both he and his friend said was bubbling and draining. This was likely an outside-in grade 1 open fracture. Due to the severity of the displacement of his left distal radius fracture, 90% displaced, possibility of a grade 1 open fracture. Decision was made, after the risks and benefits were discussed with the patient, to proceed with irrigation and debridement of the open wound, as well as operative fixation of his distal radius. The patient received 1 gram of Ancef within 2 hours of his presumed open injury. The patient stated his tetanus was up to date as of last Friday, one week ago.   The patient also stated he had decreased sensation over the 5th digit, improving towards the radial side of his hand in the first dorsal web space. Informed consent was signed and witnessed. Risks, benefits,  complications were discussed with the patient. The patient understood these risks and wishes to proceed.  DESCRIPTION OF PROCEDURE:  The patient was brought to the operating theater, site was marked and verified, time out was performed. The patient then underwent general endotracheal intubation without complication. Preoperative antibiotics were re-dosed prior to his procedure, 2 grams of Ancef, based on his weight, allowing adequate time for the antibiotics to disseminate through his body. The patient then had a tourniquet placed on the left upper portion of his arm. The patient was supine with an arm board. The patient was then prepped and draped in the usual sterile fashion. We then enlarged the small poke hole wound on the ulnar side of his left wrist. This was extended with a 15 blade, approximately 0.5 cm in either direction, once the skin and subcu were gone through. A direct channel to his ulnar styloid was palpated with a freer elevator. We then proceeded to continue our dissection down to bone, and used sharp debridement and 3 liters of normal saline with cysto tubing for the irrigation. Once this was completed, we then exsanguinated the left arm. Tourniquet was then inflated to 250 mmHg. We then proceeded with a standard volar approach to the distal radius. We took care to protect the median nerve as well as the radial artery as the pronator was elevated off the distal radius. Fractures were then cleaned with curettes. We then used a K wire through the radial styloid to aid in our reduction. Once the fracture was adequately debrided and reduced, we selected a left-sided volar locking plate. We confirmed its position  in screw trajectories on C-arm. Once it was in adequate position, all but one distal locking screws were placed, and one proximal screw to affix the plate to the shaft was placed. We then took x-rays AP and a 20-degree tilt lateral to confirm no screws had penetrated into the joint. The  patient's wrist was then taken through a series of range of motions. No evidence of screw penetration into the joint was detected on either x-ray nor physical exam. The patient's wounds were then copiously irrigated. Tourniquet was then let down after approximately 90 minutes of use. Superficial bleeders were then cauterized with bipolar electrocautery as well as Bovie cautery. Radial artery was in continuity, with no pulsatile bleeding. The subcu was closed with 2-0 Vicryl, and the skin was closed with 3-0 nylon in a horizontal mattress. The sterile dressings were then applied, and a volar splint was then applied to the patient's left arm with Ace wrap and fluffs. The patient was extubated successfully and brought to recovery in stable condition. Postoperative examination:  The patient was able to flex and extend all digits, give a thumbs-up, abduct digits.   POSTOPERATIVE COURSE:   1.  The patient will stay in-house for 24 hours of IV antibiotics, based on his open fracture.  2.  He will be non-weightbearing left upper extremity. He will continue the volar slab for 2 weeks, with no range of motion of his wrist. He may do flexion/extension and thumbs-up of his fingers. Would like him to have calcium and vitamin D. 3.  We will see how he does today with his pain medication and discharge him appropriately. The patient will follow up with DR. Ramasunder in 2 weeks' time for continued care.    ____________________________ Weyman Pedro, MD mag:mr D: 08/29/2013 08:24:00 ET T: 08/29/2013 20:21:31 ET JOB#: [id number]  cc: Weyman Pedro, MD, <Dictator> Weyman Pedro MD ELECTRONICALLY SIGNED 08/30/2013 9:55

## 2015-04-22 NOTE — H&P (Signed)
PATIENT NAME:  Eric Lyons, [name] MR#:  [id number] DATE OF BIRTH:  [birth year]  DATE OF ADMISSION:  09/12/2013  REFERRING PHYSICIAN:  Dorothea GlassmanPaul Malinda, MD  PRIMARY CARE PHYSICIAN: Nonlocal  CHIEF COMPLAINT: Increased shortness of breath, fever, sweating.   HISTORY OF PRESENT ILLNESS: This is a very nice 49 year old gentleman with history of asthma who comes today with a history of burning some poison oak 2 weeks ago and starting to have symptoms of asthma right after. The patient started wheezing, having shortness of breath and significant cough almost immediately after he was exposed to the poison ivy fumes, and for that he was seen by Urgent Care.  He received some steroids, and now he has been off of steroids for a day. The patient is actually complaining of worsening cough, worsening shortness of breath, worsening wheezing within the past 48 to 24 hours, and on top of that he is getting all sweaty and having fevers at home. For that reason, he comes in to get evaluated in the ER. In the ER, the patient had a chest x-ray that showed a left upper lobe mass versus infiltrate.  Status post CT scan, it was determined that an infectious process could be possible, but he also had some other nodularities on the lung diffuse on the middle lobes.   The patient had a history of asthma for all his life, and he states that he probably could have pneumonia for at least 12 times during his adult life. The patient states at this moment that he has been getting really tired to walk less than 10 feet, and he has to stop. His cough has been severe and is starting to have some phlegm within the past 5 days, and the phlegm started as clear and then became dark-colored and greenish within the past 48 hours. The patient is admitted for treatment of pneumonia, community-acquired, with risk factors for resistant pathogens like structural lung disease on top of prolonged use of steroids. The patient, at this moment, is hemodynamically  stable and is going to be watched under telemetry, mostly, because he is very tachycardic and severely dehydrated.   REVIEW OF SYSTEMS:  (12-systems done) CONSTITUTIONAL: No fever, fatigue prior to the last week when he started getting a little bit more short of breath, gets fatigued easily. No significant weight loss or weight gain.  EYES: No blurry vision, double vision.  EARS, NOSE, THROAT: No tinnitus or difficulty swallowing.  RESPIRATORY: Positive cough. Positive wheezing. Negative hemoptysis. Positive dyspnea. Positive asthma. Positive pleuritic pain located at the level of the left mid axillary line radiating to the left scapula. The pain occurs only whenever he takes a deep breath, and he feels like something pulling inside, intensity of 3 to 4 out of 10.  GASTROINTESTINAL: No nausea, vomiting, constipation, diarrhea.  GENITOURINARY: No dysuria or hematuria. No prostatitis.  ENDOCRINOLOGICAL: No polyuria, polydipsia, polyphagia, cold or heat intolerance.  MUSCULOSKELETAL: No significant arthritis or back pain, neck pain or extremity pain.  SKIN: No rashes or lesions.  NEUROLOGIC: No numbness or tingling.  PSYCHIATRIC: No significant agitation or depression.   PAST MEDICAL HISTORY: 1.  Asthma.  2.  Multiple episodes of pneumonia.   ALLERGIES: THE PATIENT IS ALLERGIC TO FENTANYL.   SURGICAL HISTORY:  1.  Hernioplasty.  2.  Ankle fusion.  3.  Tonsillectomy.  4.  Wrist fusion.   FAMILY HISTORY: Negative for MI. Negative for cancer. No CVA. His father had Doreatha MartinLou Gehrig's disease.   SOCIAL HISTORY: He  does not smoke, never has. He does not drink alcohol. He is divorced. He has a son, lives by himself right now. There is no family, but the patient has a lot of friends around.  MEDICATIONS: Ventolin and nasal spray are the only 2 medications he takes.   PHYSICAL EXAMINATION: VITAL SIGNS: Blood pressure 127/81, pulse 124, temperature 100.5, respiratory rate 26, pulse ox 94% on room  air. The patient is alert, oriented x 3. No significant respiratory distress, although the patient looks sick, a little bit toxic with sweating all over his face and breathing a little bit fast.   HEENT: His pupils are equal and reactive. Extraocular movements are intact. Mucosa are moist. Anicteric sclerae. Pink conjunctivae. No oral lesions. No oropharyngeal exudates.  NECK: Supple. No JVD. No thyromegaly. No adenopathy. No carotid bruits.  CARDIOVASCULAR: Regular rate and rhythm, tachycardic. No murmurs, rubs or gallops.  LUNGS: Overall, the patient has significant increase of upper respiratory secretions with some rhonchi. After he coughs, it clears up. There is significant decrease in respiratory sounds at the level of the left upper lung with egophony and pectorilochia The patient has no rales, and there is some dullness to percussion at the level of the left middle lobe. No use of accessory muscles at this moment.  ABDOMEN: Soft, nontender, nondistended. No hepatosplenomegaly. No masses. Bowel sounds are positive.  GENITAL EXAM: Deferred.  EXTREMITIES: No edema, cyanosis or clubbing. Pulses +2. Capillary refill less than 3.  SKIN: No rashes or petechiae.  MUSCULOSKELETAL: No joint effusions. The patient has a left wrist brace.  NEUROLOGIC: Cranial nerves II through XII intact. Strength is 5 out of 5 in 4 extremities.  PSYCHIATRIC: Negative for agitation. The patient's judgment is normal. Alert, oriented x 3.  LYMPHATIC: Negative for lymphadenopathy in neck or supraclavicular areas.   LABORATORY AND RADIOLOGICAL DATA:  Glucose 125, BUN 16, sodium 130, potassium 3.9, chloride 96, troponin 0.02. White count is 23,000, hemoglobin is 15. D-dimer is slightly elevated at 0.60.  Chest x-ray and a CT scan as mentioned above.   ASSESSMENT AND PLAN: A 49 year old gentleman with a history of asthma, recent exposure to fumes from poison oak or poison ivy within the last 2 weeks that got him into  exacerbation of his asthma and got steroids.   1.  Pneumonia: The patient diagnosed with community-acquired pneumonia, although the treatment will be a little bit different because of his structural lung disease. The patient had multiple pneumonias in the past and significant asthma, possible scarring already of his lung, and he has been on steroids, for which we are going to treat him with Zosyn and levofloxacin. The patient received Rocephin and azithromycin here in the ER, but we are going to upgrade his dose as per protocol. Nebs have been ordered. Since the patient has history of asthma, he received 125 mg of Solu-Medrol. I would like to avoid systemic steroids just because his immune system could be compromised, and that might be the reason of the patient getting pneumonia, for which  I am going to avoid it as much as possible.  If the patient started wheezing and significant distress, we are going to start him on steroids, but for now we are going to do local therapy with aerosol steroids with budesonide  and Advair. Pulmonary consultation has been ordered because of the suspicions form and suspicious characteristics of this pneumonia just to evaluate the possibility of postobstructive pneumonia. His risk of cancer is low as the patient is  not a smoker, but he could have some scar tissue. As far as his increased white blood cells, the patient meets criteria for systemic inflammatory response syndrome, likely secondary to pneumonia:  The patient is covered with broad-spectrum antibiotics. Blood cultures and sputum cultures have been taken.  2.  Hyponatremia: The patient looks dehydrated. This could be secondary to intravascular volume depletion but also could be SIADH due to his lung process. If he does have SIADH, he will be more suspicious for small carcinoma of the lung. We are going to get a urine sodium and urine osmolality.  3.  Deep vein thrombosis prophylaxis with Lovenox.  Gastrointestinal  prophylaxis with Protonix as the patient is going to be taking antibiotics, and he already got some dose of steroids.   CODE STATUS:  The patient is a FULL CODE.     TIME SPENT: I spent about 50 minutes with this patient.   ____________________________ Felipa Furnace, MD rsg:cb D: 09/12/2013 16:10:07 ET T: 09/12/2013 17:08:11 ET JOB#: [id number]  cc: Felipa Furnace, MD, <Dictator> Yareli Carthen Juanda Chance MD ELECTRONICALLY SIGNED 09/13/2013 14:02

## 2015-11-13 ENCOUNTER — Encounter: Payer: Self-pay | Admitting: Emergency Medicine

## 2015-11-13 ENCOUNTER — Emergency Department: Payer: Medicare Other

## 2015-11-13 ENCOUNTER — Inpatient Hospital Stay
Admission: EM | Admit: 2015-11-13 | Discharge: 2015-11-15 | DRG: 193 | Disposition: A | Discharge status: Home / Self Care | Payer: Medicare Other | Attending: Internal Medicine | Admitting: Internal Medicine

## 2015-11-13 DIAGNOSIS — Z79899 Other long term (current) drug therapy: Secondary | ICD-10-CM

## 2015-11-13 DIAGNOSIS — E669 Obesity, unspecified: Secondary | ICD-10-CM | POA: Diagnosis present

## 2015-11-13 DIAGNOSIS — J9601 Acute respiratory failure with hypoxia: Secondary | ICD-10-CM | POA: Diagnosis present

## 2015-11-13 DIAGNOSIS — J45909 Unspecified asthma, uncomplicated: Secondary | ICD-10-CM | POA: Diagnosis present

## 2015-11-13 DIAGNOSIS — R0902 Hypoxemia: Secondary | ICD-10-CM | POA: Diagnosis present

## 2015-11-13 DIAGNOSIS — Z9889 Other specified postprocedural states: Secondary | ICD-10-CM

## 2015-11-13 DIAGNOSIS — Z811 Family history of alcohol abuse and dependence: Secondary | ICD-10-CM | POA: Diagnosis not present

## 2015-11-13 DIAGNOSIS — J441 Chronic obstructive pulmonary disease with (acute) exacerbation: Secondary | ICD-10-CM | POA: Diagnosis present

## 2015-11-13 DIAGNOSIS — J189 Pneumonia, unspecified organism: Secondary | ICD-10-CM | POA: Diagnosis not present

## 2015-11-13 DIAGNOSIS — Z888 Allergy status to other drugs, medicaments and biological substances status: Secondary | ICD-10-CM

## 2015-11-13 DIAGNOSIS — Z7722 Contact with and (suspected) exposure to environmental tobacco smoke (acute) (chronic): Secondary | ICD-10-CM | POA: Diagnosis present

## 2015-11-13 HISTORY — DX: Unspecified asthma, uncomplicated: J45.909

## 2015-11-13 HISTORY — DX: Chronic obstructive pulmonary disease, unspecified: J44.9

## 2015-11-13 LAB — CBC
HCT: 45.1 % (ref 40.0–52.0)
Hemoglobin: 14.8 g/dL (ref 13.0–18.0)
MCH: 29.3 pg (ref 26.0–34.0)
MCHC: 32.8 g/dL (ref 32.0–36.0)
MCV: 89.2 fL (ref 80.0–100.0)
Platelets: 252 10*3/uL (ref 150–440)
RBC: 5.05 MIL/uL (ref 4.40–5.90)
RDW: 13.3 % (ref 11.5–14.5)
WBC: 8.2 10*3/uL (ref 3.8–10.6)

## 2015-11-13 LAB — COMPREHENSIVE METABOLIC PANEL
ALT: 25 U/L (ref 17–63)
AST: 20 U/L (ref 15–41)
Albumin: 3.8 g/dL (ref 3.5–5.0)
Alkaline Phosphatase: 52 U/L (ref 38–126)
Anion gap: 5 (ref 5–15)
BUN: 9 mg/dL (ref 6–20)
CO2: 29 mmol/L (ref 22–32)
Calcium: 8.7 mg/dL — ABNORMAL LOW (ref 8.9–10.3)
Chloride: 105 mmol/L (ref 101–111)
Creatinine, Ser: 0.7 mg/dL (ref 0.61–1.24)
GFR calc Af Amer: >60 mL/min (ref >60)
GFR calc non Af Amer: >60 mL/min (ref >60)
Glucose, Bld: 117 mg/dL — ABNORMAL HIGH (ref 65–99)
Potassium: 3.6 mmol/L (ref 3.5–5.1)
Sodium: 139 mmol/L (ref 135–145)
Total Bilirubin: 0.6 mg/dL (ref 0.3–1.2)
Total Protein: 6.8 g/dL (ref 6.5–8.1)

## 2015-11-13 LAB — TROPONIN I: Troponin I: <0.03 ng/mL (ref <0.031)

## 2015-11-13 LAB — MAGNESIUM: Magnesium: 2 mg/dL (ref 1.7–2.4)

## 2015-11-13 MED ORDER — METHYLPREDNISOLONE SODIUM SUCC 125 MG IJ SOLR
60.0000 mg | Freq: Four times a day (QID) | INTRAMUSCULAR | Status: DC
  Administered 2015-11-13 – 2015-11-15 (×8): 60 mg via INTRAVENOUS
  Filled 2015-11-13 (×8): qty 2

## 2015-11-13 MED ORDER — GUAIFENESIN 100 MG/5ML PO SOLN: 5.0000 mL | ORAL | Status: DC | PRN

## 2015-11-13 MED ORDER — ALBUTEROL SULFATE (2.5 MG/3ML) 0.083% IN NEBU
INHALATION_SOLUTION | RESPIRATORY_TRACT | Status: AC
  Administered 2015-11-13: 10:00:00
  Filled 2015-11-13: qty 6

## 2015-11-13 MED ORDER — METHYLPREDNISOLONE SODIUM SUCC 125 MG IJ SOLR
INTRAMUSCULAR | Status: AC
  Administered 2015-11-13: 125 mg
  Filled 2015-11-13: qty 2

## 2015-11-13 MED ORDER — ONDANSETRON HCL 4 MG/2ML IJ SOLN: 4.0000 mg | Freq: Four times a day (QID) | INTRAMUSCULAR | Status: DC | PRN

## 2015-11-13 MED ORDER — SODIUM CHLORIDE 0.9 % IJ SOLN
3.0000 mL | Freq: Two times a day (BID) | INTRAMUSCULAR | Status: DC
  Administered 2015-11-13 – 2015-11-14 (×3): 3 mL via INTRAVENOUS

## 2015-11-13 MED ORDER — SODIUM CHLORIDE 0.9 % IJ SOLN
3.0000 mL | INTRAMUSCULAR | Status: DC | PRN
  Administered 2015-11-14 – 2015-11-15 (×2): 3 mL via INTRAVENOUS
  Filled 2015-11-13 (×2): qty 10

## 2015-11-13 MED ORDER — SODIUM CHLORIDE 0.9 % IV SOLN: 250.0000 mL | INTRAVENOUS | Status: DC | PRN

## 2015-11-13 MED ORDER — IPRATROPIUM-ALBUTEROL 0.5-2.5 (3) MG/3ML IN SOLN
3.0000 mL | Freq: Four times a day (QID) | RESPIRATORY_TRACT | Status: DC
  Administered 2015-11-13 – 2015-11-15 (×6): 3 mL via RESPIRATORY_TRACT
  Filled 2015-11-13 (×7): qty 3

## 2015-11-13 MED ORDER — IPRATROPIUM-ALBUTEROL 0.5-2.5 (3) MG/3ML IN SOLN
RESPIRATORY_TRACT | Status: AC
  Administered 2015-11-13: 10:00:00
  Filled 2015-11-13: qty 3

## 2015-11-13 MED ORDER — CETYLPYRIDINIUM CHLORIDE 0.05 % MT LIQD
7.0000 mL | Freq: Two times a day (BID) | OROMUCOSAL | Status: DC
  Administered 2015-11-14 – 2015-11-15 (×3): 7 mL via OROMUCOSAL

## 2015-11-13 MED ORDER — ALBUTEROL SULFATE (2.5 MG/3ML) 0.083% IN NEBU
2.5000 mg | INHALATION_SOLUTION | RESPIRATORY_TRACT | Status: DC | PRN
  Administered 2015-11-13: 2.5 mg via RESPIRATORY_TRACT

## 2015-11-13 MED ORDER — ACETAMINOPHEN 650 MG RE SUPP: 650.0000 mg | Freq: Four times a day (QID) | RECTAL | Status: DC | PRN

## 2015-11-13 MED ORDER — ONDANSETRON HCL 4 MG PO TABS: 4.0000 mg | ORAL_TABLET | Freq: Four times a day (QID) | ORAL | Status: DC | PRN

## 2015-11-13 MED ORDER — ACETAMINOPHEN 325 MG PO TABS
650.0000 mg | ORAL_TABLET | Freq: Four times a day (QID) | ORAL | Status: DC | PRN
  Administered 2015-11-13 – 2015-11-15 (×3): 650 mg via ORAL
  Filled 2015-11-13 (×4): qty 2

## 2015-11-13 MED ORDER — ENOXAPARIN SODIUM 40 MG/0.4ML ~~LOC~~ SOLN
40.0000 mg | SUBCUTANEOUS | Status: DC
  Administered 2015-11-13 – 2015-11-14 (×2): 40 mg via SUBCUTANEOUS
  Filled 2015-11-13 (×2): qty 0.4

## 2015-11-13 NOTE — H&P (Signed)
[facility] [place] [facility]   PATIENT NAME: Eric GandyWilliam Linney    MR#:  [id number]  DATE OF BIRTH:  1966/05/19  DATE OF ADMISSION:  11/13/2015  PRIMARY CARE PHYSICIAN: WHITE, [name], NP   REQUESTING/REFERRING PHYSICIAN: Jene Everyobert Kinner, MD  CHIEF COMPLAINT:   Chief Complaint  Patient presents with  . Cough  . Nasal Congestion  . Generalized Body Aches  . Fever   cough, sputum and shortness of breath for 3 days  HISTORY OF PRESENT ILLNESS:  Eric GandyWilliam Mani  is a 49 y.o. male with a known history of asthma and COPD. The patient is alert, awake and oriented. He has had the cough with yellowish sputum, wheezing and shortness of breath for the past 3 days. Symptoms is worsening today. But he denies any fever or chills, no chest pain, palpitation or orthopnea or nocturnal dyspnea or leg edema. Her O2 saturation decreased to 80s% the ED. He is put on oxygen by nasal cannular 3 L now. He run out of albuterol for the past few days.  PAST MEDICAL HISTORY:   Past Medical History  Diagnosis Date  . Asthma   . COPD (chronic obstructive pulmonary disease) (HCC)     PAST SURGICAL HISTORY:   Past Surgical History  Procedure Laterality Date  . Ankle fracture surgery    . Hernia repair    . Tonsillectomy    . Knee arthroscopy      SOCIAL HISTORY:   Social History  Substance Use Topics  . Smoking status: Never Smoker   . Smokeless tobacco: Never Used  . Alcohol Use: No    FAMILY HISTORY:   Family History  Problem Relation Age of Onset  . Alcohol abuse Mother   . ALS Father     DRUG ALLERGIES:   Allergies  Allergen Reactions  . Prednisone Anaphylaxis  . Fentanyl Rash   Per patient, prednisone made him violent. REVIEW OF SYSTEMS:  CONSTITUTIONAL: No fever, fatigue or weakness.  EYES: No blurred or double vision.  EARS, NOSE, AND THROAT: No tinnitus or ear pain.  RESPIRATORY: Has cough, shortness of breath, wheezing but no  hemoptysis.  CARDIOVASCULAR: No chest pain, orthopnea, edema.  GASTROINTESTINAL: No nausea, vomiting, diarrhea or abdominal pain.  GENITOURINARY: No dysuria, hematuria.  ENDOCRINE: No polyuria, nocturia,  HEMATOLOGY: No anemia, easy bruising or bleeding SKIN: No rash or lesion. MUSCULOSKELETAL: No joint pain or arthritis.   NEUROLOGIC: No tingling, numbness, weakness.  PSYCHIATRY: No anxiety or depression.   MEDICATIONS AT HOME:   Prior to Admission medications   Medication Sig Start Date End Date Taking? Authorizing Provider  albuterol (VENTOLIN HFA) 108 (90 BASE) MCG/ACT inhaler Inhale 2 puffs into the lungs every 4 (four) hours as needed for wheezing.    Yes Historical Provider, MD  Ipratropium-Albuterol (COMBIVENT) 20-100 MCG/ACT AERS respimat Inhale 2 puffs into the lungs 4 (four) times daily.   Yes Historical Provider, MD  vardenafil (LEVITRA) 20 MG tablet Take 20 mg by mouth daily as needed for erectile dysfunction.    Yes Historical Provider, MD      VITAL SIGNS:  Blood pressure 132/79, pulse 92, temperature 98.2 F (36.8 C), temperature source Oral, resp. rate 16, height 5\' 5"  (1.651 m), weight 99.791 kg (220 lb), SpO2 96 %.  PHYSICAL EXAMINATION:  GENERAL:  49 y.o.-year-old patient lying in the bed with no acute distress. Obese EYES: Pupils equal, round, reactive to light and accommodation. No scleral icterus. Extraocular muscles intact.  HEENT:  Head atraumatic, normocephalic. Oropharynx and nasopharynx clear. Moist oral mucosa. NECK:  Supple, no jugular venous distention. No thyroid enlargement, no tenderness.  LUNGS: Diminished breath sounds bilaterally with expiratory wheezing, no rales,rhonchi or crepitation. No use of accessory muscles of respiration.  CARDIOVASCULAR: S1, S2 normal. No murmurs, rubs, or gallops.  ABDOMEN: Soft, nontender, nondistended. Bowel sounds present. No organomegaly or mass.  EXTREMITIES: No pedal edema, cyanosis, or clubbing.  NEUROLOGIC:  Cranial nerves II through XII are intact. Muscle strength 5/5 in all extremities. Sensation intact. Gait not checked.  PSYCHIATRIC: The patient is alert and oriented x 3.  SKIN: No obvious rash, lesion, or ulcer.   LABORATORY PANEL:   CBC  Recent Labs Lab 11/13/15 0955  WBC 8.2  HGB 14.8  HCT 45.1  PLT 252   ------------------------------------------------------------------------------------------------------------------  Chemistries   Recent Labs Lab 11/13/15 0955  NA 139  K 3.6  CL 105  CO2 29  GLUCOSE 117*  BUN 9  CREATININE 0.70  CALCIUM 8.7*  AST 20  ALT 25  ALKPHOS 52  BILITOT 0.6   ------------------------------------------------------------------------------------------------------------------  Cardiac Enzymes  Recent Labs Lab 11/13/15 0955  TROPONINI <0.03   ------------------------------------------------------------------------------------------------------------------  RADIOLOGY:  Dg Chest Portable 1 View  11/13/2015  CLINICAL DATA:  Shortness of breath EXAM: PORTABLE CHEST 1 VIEW COMPARISON:  09/14/2013 chest radiograph. FINDINGS: A vertical bar artifact in the medial left hemi thorax obscures visualization in this location. Stable cardiomediastinal silhouette with normal heart size. No pneumothorax. No right pleural effusion. Slight blunting of the left costophrenic angle. Minimal hazy opacity at the left costophrenic angle. No pulmonary edema. IMPRESSION: 1. Minimal hazy opacity at the left costophrenic angle, nonspecific, favor atelectasis, cannot exclude a developing pneumonia. 2. Slight blunting of the left costophrenic angle, cannot exclude a small left pleural effusion. Electronically Signed   By: Delbert Phenix M.D.   On: 11/13/2015 10:45    EKG:   Orders placed or performed during the hospital encounter of 11/13/15  . ED EKG  . ED EKG  . EKG 12-Lead  . EKG 12-Lead    IMPRESSION AND PLAN:   COPD exacerbation Hypoxia Obesity  The  patient will be admitted to   floor. I will start IV solumedrol, DuoNeb when necessary and continue Combivent. Robitassin when necessary. Try to wean off oxygen by nasal cannula. Obesity, advised Exercise and diet control.   All the records are reviewed and case discussed with ED provider. Management plans discussed with the patient, family and they are in agreement.  CODE STATUS: Full code  TOTAL TIME TAKING CARE OF THIS PATIENT: 50 minutes.    Shaune Pollack M.D on 11/13/2015 at 1:24 PM  Between 7am to 6pm - Pager - [phone]  After 6pm go to [email or web address] - password [email or web address] [facility]  [facility]  Office  [phone]  CC: Primary care physician; WHITE, Arlyss Repress, NP

## 2015-11-13 NOTE — ED Provider Notes (Signed)
[facility] Emergency Department Provider Note  ____________________________________________  Time seen: On arrival  I have reviewed the triage vital signs and the nursing notes.   HISTORY  Chief Complaint Cough; Nasal Congestion; Generalized Body Aches; and Fever    HPI Eric Lyons is a 49 y.o. male who presents with shortness of breath. Patient reports a history of asthma and COPD and reports his breathing has been worsening over the last 2 days. He is becoming winded with any exertion. He does not smoke but he is around smokers frequently. He also has had a cough and some nasal congestion and some chills but has not checked his temperature. He is concerned about pneumonia. He has had to be admitted for asthma in the past and even reports having a breathing tube placed before. He denies chest pain, only tightness     Past Medical History  Diagnosis Date  . Asthma   . COPD (chronic obstructive pulmonary disease) (HCC)     There are no active problems to display for this patient.   History reviewed. No pertinent past surgical history.  No current outpatient prescriptions on file.  Allergies Prednisone and Fentanyl  History reviewed. No pertinent family history.  Social History Social History  Substance Use Topics  . Smoking status: Never Smoker   . Smokeless tobacco: None  . Alcohol Use: No    Review of Systems  Constitutional: Negative for fever. Other for chills Eyes: Negative for visual changes. ENT: Negative for sore throat. Positive for congestion Cardiovascular: Negative for chest pain. Respiratory: Positive for shortness of breath Gastrointestinal: Negative for abdominal pain, vomiting and diarrhea. Genitourinary: Negative for dysuria. Musculoskeletal: Negative for back pain. Skin: Negative for rash. Neurological: Negative for headaches or focal weakness Psychiatric: No  anxiety    ____________________________________________   PHYSICAL EXAM:  VITAL SIGNS: ED Triage Vitals  Enc Vitals Group     BP 11/13/15 0938 137/82 mmHg     Pulse Rate 11/13/15 0938 86     Resp 11/13/15 0938 20     Temp 11/13/15 0938 98.2 F (36.8 C)     Temp Source 11/13/15 0938 Oral     SpO2 11/13/15 0938 90 %     Weight 11/13/15 0938 220 lb (99.791 kg)     Height 11/13/15 0938 5\' 5"  (1.651 m)     Head Cir --      Peak Flow --      Pain Score 11/13/15 0931 8     Pain Loc --      Pain Edu? --      Excl. in GC? --      Constitutional: Alert and oriented. Well appearing and in no distress. Eyes: Conjunctivae are normal.  ENT   Head: Normocephalic and atraumatic.   Mouth/Throat: Mucous membranes are moist. Cardiovascular: Normal rate, regular rhythm. Normal and symmetric distal pulses are present in all extremities. No murmurs, rubs, or gallops. Respiratory: Mild tachypnea, wheezes diffusely.  Gastrointestinal: Soft and non-tender in all quadrants. No distention. There is no CVA tenderness. Genitourinary: deferred Musculoskeletal: Nontender with normal range of motion in all extremities. No lower extremity tenderness nor edema. Neurologic:  Normal speech and language. No gross focal neurologic deficits are appreciated. Skin:  Skin is warm, dry and intact. No rash noted. Psychiatric: Mood and affect are normal. Patient exhibits appropriate insight and judgment.  ____________________________________________    LABS (pertinent positives/negatives)  Labs Reviewed  CBC  COMPREHENSIVE METABOLIC PANEL  TROPONIN I  ____________________________________________   EKG  ED ECG REPORT I, Jene Every, the attending physician, personally viewed and interpreted this ECG.  Date: 11/13/2015 EKG Time: 9:46 AM Rate: 82 Rhythm: normal sinus rhythm QRS Axis: normal Intervals: normal ST/T Wave abnormalities: normal Conduction Disutrbances: none Narrative  Interpretation: unremarkable   ____________________________________________    RADIOLOGY I have personally reviewed any xrays that were ordered on this patient: Chest x-ray demonstrates atelectasis versus early pneumonia  ____________________________________________   PROCEDURES  Procedure(s) performed: none  Critical Care performed: none  ____________________________________________   INITIAL IMPRESSION / ASSESSMENT AND PLAN / ED COURSE  Pertinent labs & imaging results that were available during my care of the patient were reviewed by me and considered in my medical decision making (see chart for details).  Patient presents with hypoxia, audible wheezing and a history of asthma most consistent with asthma exacerbation versus COPD exacerbation. He reports a history of both.  We will treat with solumedrol, nebulizers and reevaluate  No significant improvement on exam, patient will require admission  ____________________________________________   FINAL CLINICAL IMPRESSION(S) / ED DIAGNOSES  Final diagnoses:  COPD with exacerbation (HCC)     Jene Every, MD 11/13/15 1349

## 2015-11-13 NOTE — ED Notes (Signed)
Pt to ed with c/o cough, congestion, fever and body aches x 3 days.  Pt with hx of asthma and copd.

## 2015-11-14 MED ORDER — DEXTROSE 5 % IV SOLN
1.0000 g | INTRAVENOUS | Status: DC
  Administered 2015-11-14: 17:00:00 1 g via INTRAVENOUS
  Filled 2015-11-14 (×2): qty 10

## 2015-11-14 MED ORDER — AZITHROMYCIN 250 MG PO TABS
500.0000 mg | ORAL_TABLET | Freq: Every day | ORAL | Status: DC
  Administered 2015-11-14 – 2015-11-15 (×2): 500 mg via ORAL
  Filled 2015-11-14 (×2): qty 2

## 2015-11-14 NOTE — Plan of Care (Signed)
Problem: Respiratory: Goal: Ability to maintain a clear airway will improve Outcome: Progressing Pt continues on 4 L of oxygen, continues to receive breathing treatments.   Problem: Pain Management: Goal: Expressions of feelings of enhanced comfort will increase Outcome: Progressing Pt c/o headache, humidified oxygen applied and cleared up headache.   Problem: Safety: Goal: Ability to remain free from injury will improve Outcome: Progressing Pt is alert and oriented, high fall risk d/t past multiple falls. Refuses bed alarm, will call for assistance when needed.

## 2015-11-14 NOTE — Care Management Important Message (Signed)
Important Message  Patient Details  Name: Eric Lyons MRN: [id number] Date of Birth: 1966/10/10   Medicare Important Message Given:  Yes    Gwenette GreetBrenda S Shelvia Fojtik, RN 11/14/2015, 10:42 AM

## 2015-11-14 NOTE — Progress Notes (Signed)
[facility] [place] at [facility], [facility]   PATIENT NAME: Eric GandyWilliam Lyons    MR#:  [id number]  DATE OF BIRTH:  1966-05-28  SUBJECTIVE: 49 year old male patient admitted for COPD exacerbion. Has cough with yellow phlegm, wheezing also. No fever.   CHIEF COMPLAINT:   Chief Complaint  Patient presents with  . Cough  . Nasal Congestion  . Generalized Body Aches  . Fever    REVIEW OF SYSTEMS:    Review of Systems  Constitutional: Negative for fever, chills and weight loss.  HENT: Negative for hearing loss.   Eyes: Negative for blurred vision, double vision and photophobia.  Respiratory: Positive for cough and wheezing. Negative for hemoptysis and shortness of breath.   Cardiovascular: Negative for chest pain, palpitations, orthopnea and leg swelling.  Gastrointestinal: Negative for vomiting, abdominal pain and diarrhea.  Genitourinary: Negative for dysuria and urgency.  Musculoskeletal: Negative for myalgias and neck pain.  Skin: Negative for rash.  Neurological: Negative for dizziness, focal weakness, seizures, weakness and headaches.  Psychiatric/Behavioral: Negative for memory loss. The patient does not have insomnia.     Nutrition:  Tolerating Diet: Tolerating PT:      DRUG ALLERGIES:   Allergies  Allergen Reactions  . Prednisone Anaphylaxis  . Fentanyl Rash    VITALS:  Blood pressure 141/88, pulse 105, temperature 97.6 F (36.4 C), temperature source Oral, resp. rate 20, height 5\' 5"  (1.651 m), weight 97.342 kg (214 lb 9.6 oz), SpO2 94 %.  PHYSICAL EXAMINATION:   Physical Exam  GENERAL:  49 y.o.-year-old patient lying in the bed with no acute distress.  EYES: Pupils equal, round, reactive to light and accommodation. No scleral icterus. Extraocular muscles intact.  HEENT: Head atraumatic, normocephalic. Oropharynx and nasopharynx clear.  NECK:  Supple, no jugular venous distention. No thyroid enlargement, no tenderness.  LUNGS: Mild  expiratory wheeze in all lung fields. CARDIOVASCULAR: S1, S2 normal. No murmurs, rubs, or gallops.  ABDOMEN: Soft, nontender, nondistended. Bowel sounds present. No organomegaly or mass.  EXTREMITIES: No pedal edema, cyanosis, or clubbing.  NEUROLOGIC: Cranial nerves II through XII are intact. Muscle strength 5/5 in all extremities. Sensation intact. Gait not checked.  PSYCHIATRIC: The patient is alert and oriented x 3.  SKIN: No obvious rash, lesion, or ulcer.    LABORATORY PANEL:   CBC  Recent Labs Lab 11/13/15 0955  WBC 8.2  HGB 14.8  HCT 45.1  PLT 252   ------------------------------------------------------------------------------------------------------------------  Chemistries   Recent Labs Lab 11/13/15 0955 11/13/15 1543  NA 139  --   K 3.6  --   CL 105  --   CO2 29  --   GLUCOSE 117*  --   BUN 9  --   CREATININE 0.70  --   CALCIUM 8.7*  --   MG  --  2.0  AST 20  --   ALT 25  --   ALKPHOS 52  --   BILITOT 0.6  --    ------------------------------------------------------------------------------------------------------------------  Cardiac Enzymes  Recent Labs Lab 11/13/15 0955  TROPONINI <0.03   ------------------------------------------------------------------------------------------------------------------  RADIOLOGY:  Dg Chest Portable 1 View  11/13/2015  CLINICAL DATA:  Shortness of breath EXAM: PORTABLE CHEST 1 VIEW COMPARISON:  09/14/2013 chest radiograph. FINDINGS: A vertical bar artifact in the medial left hemi thorax obscures visualization in this location. Stable cardiomediastinal silhouette with normal heart size. No pneumothorax. No right pleural effusion. Slight blunting of the left costophrenic angle. Minimal hazy opacity at the left costophrenic angle. No pulmonary  edema. IMPRESSION: 1. Minimal hazy opacity at the left costophrenic angle, nonspecific, favor atelectasis, cannot exclude a developing pneumonia. 2. Slight blunting of the left  costophrenic angle, cannot exclude a small left pleural effusion. Electronically Signed   By: Delbert Phenix M.D.   On: 11/13/2015 10:45     ASSESSMENT AND PLAN:   Principal Problem:   COPD exacerbation (HCC) Active Problems:   Hypoxia   1. Community-acquired pneumonia: left sided pneumonia; started  on Rocephin, azithromycin. 2. Copd exacerbation: Clinically improving but has significant wheezing: Continue Solu-Medrol, nebulizer, likely discharge tomorrow..   All the records are reviewed and case discussed with Care Management/Social Workerr. Management plans discussed with the patient, family and they are in agreement.  CODE STATUS:full  TOTAL TIME TAKING CARE OF THIS PATIENT: .   POSSIBLE D/C IN 1-2 DAYS, DEPENDING ON CLINICAL CONDITION.   Katha Hamming M.D on 11/14/2015 at 12:05 PM  Between 7am to 6pm - Pager - [phone]  After 6pm go to [email or web address] - password [email or web address] [facility]  [facility]  Office  [phone]  CC: Primary care physician; WHITE, Arlyss Repress, NP

## 2015-11-15 MED ORDER — FLUTICASONE-SALMETEROL 500-50 MCG/DOSE IN AEPB: 1.0000 | INHALATION_SPRAY | Freq: Two times a day (BID) | RESPIRATORY_TRACT | Status: DC

## 2015-11-15 MED ORDER — AZITHROMYCIN 500 MG PO TABS: 500.0000 mg | ORAL_TABLET | Freq: Every day | ORAL | Status: DC

## 2015-11-15 MED ORDER — PREDNISONE 10 MG (21) PO TBPK: 10.0000 mg | ORAL_TABLET | Freq: Every day | ORAL | Status: DC

## 2015-11-15 NOTE — Progress Notes (Signed)
Pt for discharge home. A/o.  No resp distress.  Up and about in room. Sl d/cd.  Discharge instructions discussed with pt.  meds discussed. Diet activity and f/u gone over  With pt.  Verbalize understanding. Ready for discharge  After shower.

## 2015-11-15 NOTE — Plan of Care (Signed)
Problem: Safety: Goal: Ability to remain free from injury will improve Outcome: Progressing Pt alert and oriented. Tylenol for headache with relief. no respiratory distress noted. Ambulated in the hall without difficulties. Continues on iv solu-medrol Q 6 hrs, and breathing treatments scheduled during this shift. Possible discharge home. Continue to monitor.

## 2015-11-15 NOTE — Progress Notes (Signed)
   11/15/15 0915  Clinical Encounter Type  Visited With Patient  Visit Type Follow-up  Referral From Chaplain  Consult/Referral To Chaplain  Advance Directives (For Healthcare)  Does patient have an advance directive? No  Would patient like information on creating an advanced directive? Yes - Educational materials given  Follow up visit for Advance Directive.  Pt wanted to complete while here but did not have a photo ID with him so unable to complete at this time. Pt said he was supposed to be going home today so he said he would take it to his bank and have it completed there.  [employer] GroupChaplain England Greb-pager [phone]

## 2015-11-15 NOTE — Discharge Summary (Signed)
Eric Lyons, is a 49 y.o. male  DOB 06-26-1966  MRN [id number].  Admission date:  11/13/2015  Admitting Physician  Shaune Pollack, MD  Discharge Date:  11/15/2015   Primary MD  WHITE, Arlyss Repress, NP  Recommendations for primary care physician for things to follow:  Follow-up with primary doctor in 1 week.   Admission Diagnosis  COPD with exacerbation (HCC) [J44.1]   Discharge Diagnosis  COPD with exacerbation (HCC) [J44.1]    Principal Problem:   COPD exacerbation (HCC) Active Problems:   Hypoxia      Past Medical History  Diagnosis Date  . Asthma   . COPD (chronic obstructive pulmonary disease) [facility])     Past Surgical History  Procedure Laterality Date  . Ankle fracture surgery    . Hernia repair    . Tonsillectomy    . Knee arthroscopy         History of present illness and  Hospital Course:     Kindly see H&P for history of present illness and admission details, please review complete Labs, Consult reports and Test reports for all details in brief  HPI  from the history and physical done on the day of admission 49 year old male patient with history of COPD secondhand smoking comes in because of wheezing, shortness of breath runny nose. O2 sats were 80% on room air in the emergency room. Started on oxygen 3 L, admitted to hospital for respiratory depression secondary to COPD exacerbation.   Hospital Course   Acute hypoxic respiratory failure secondary to COPD exacerbation: And community-acquired pneumonia Symptoms improved with I, IV Rocephin, Zithromax, Solu-Medrol. Patient to O2 saturations today more than 94 percent on room air. Clinically lungs had no wheezing. Discharge home with thrombi seen. Patient says he is allergic to prednisone and cannot take prednisone. So he was given Combivent,  Advair, azithromycin prescription. O2 sats on room air at rest and also on exertion of stayed more than 90% Discharge Condition: Stable   Follow UP      Discharge Instructions  and  Discharge Medications        Medication List    TAKE these medications        azithromycin 500 MG tablet  Commonly known as:  ZITHROMAX  Take 1 tablet (500 mg total) by mouth daily.     Fluticasone-Salmeterol 500-50 MCG/DOSE Aepb  Commonly known as:  ADVAIR DISKUS  Inhale 1 puff into the lungs 2 (two) times daily at 10 AM and 5 PM.     Ipratropium-Albuterol 20-100 MCG/ACT Aers respimat  Commonly known as:  COMBIVENT  Inhale 2 puffs into the lungs 4 (four) times daily.     predniSONE 10 MG (21) Tbpk tablet  Commonly known as:  STERAPRED UNI-PAK 21 TAB  Take 1 tablet (10 mg total) by mouth daily. 3 tab daily for 3 days 2 tab daily for 2 days 1 tab daily for 2 days     vardenafil 20 MG tablet  Commonly known as:  LEVITRA  Take 20 mg by mouth daily as needed for erectile dysfunction.     VENTOLIN HFA 108 (90 BASE) MCG/ACT inhaler  Generic drug:  albuterol  Inhale 2 puffs into the lungs every 4 (four) hours as needed for wheezing.          Diet and Activity recommendation: See Discharge Instructions above   Consults obtained - none  Major procedures and Radiology Reports - PLEASE review detailed and final reports for all details,  in brief -      Dg Chest Portable 1 View  11/13/2015  CLINICAL DATA:  Shortness of breath EXAM: PORTABLE CHEST 1 VIEW COMPARISON:  09/14/2013 chest radiograph. FINDINGS: A vertical bar artifact in the medial left hemi thorax obscures visualization in this location. Stable cardiomediastinal silhouette with normal heart size. No pneumothorax. No right pleural effusion. Slight blunting of the left costophrenic angle. Minimal hazy opacity at the left costophrenic angle. No pulmonary edema. IMPRESSION: 1. Minimal hazy opacity at the left costophrenic angle,  nonspecific, favor atelectasis, cannot exclude a developing pneumonia. 2. Slight blunting of the left costophrenic angle, cannot exclude a small left pleural effusion. Electronically Signed   By: Delbert PhenixJason A Poff M.D.   On: 11/13/2015 10:45    Micro Results     No results found for this or any previous visit (from the past 240 hour(s)).     Today   Subjective:   Eric GandyWilliam Lyons today has no headache,no chest abdominal pain,no new weakness tingling or numbness, shortness of breath, no cough. Feels better and wants to go home.   Objective:   Blood pressure 153/87, pulse 92, temperature 98 F (36.7 C), temperature source Oral, resp. rate 20, height 5\' 5"  (1.651 m), weight 97.342 kg (214 lb 9.6 oz), SpO2 94 %.   Intake/Output Summary (Last 24 hours) at 11/15/15 1456 Last data filed at 11/15/15 0900  Gross per 24 hour  Intake    480 ml  Output      0 ml  Net    480 ml    Exam Awake Alert, Oriented x 3, No new F.N deficits, Normal affect .AT,PERRAL Supple Neck,No JVD, No cervical lymphadenopathy appriciated.  Symmetrical Chest wall movement, Good air movement bilaterally, CTAB RRR,No Gallops,Rubs or new Murmurs, No Parasternal Heave +ve B.Sounds, Abd Soft, Non tender, No organomegaly appriciated, No rebound -guarding or rigidity. No Cyanosis, Clubbing or edema, No new Rash or bruise  Data Review   CBC w Diff: Lab Results  Component Value Date   WBC 8.2 11/13/2015   WBC 11.6* 09/14/2013   HGB 14.8 11/13/2015   HGB 14.2 09/14/2013   HCT 45.1 11/13/2015   HCT 42.7 09/14/2013   PLT 252 11/13/2015   PLT 279 09/14/2013   LYMPHOPCT 14.7 09/14/2013   MONOPCT 4.4 09/14/2013   EOSPCT 0.6 09/14/2013   BASOPCT 0.6 09/14/2013    CMP: Lab Results  Component Value Date   NA 139 11/13/2015   NA 134* 09/13/2013   K 3.6 11/13/2015   K 4.1 09/13/2013   CL 105 11/13/2015   CL 99 09/13/2013   CO2 29 11/13/2015   CO2 27 09/13/2013   BUN 9 11/13/2015   BUN 18 09/13/2013    CREATININE 0.70 11/13/2015   CREATININE 0.95 09/13/2013   PROT 6.8 11/13/2015   PROT 7.1 09/13/2013   ALBUMIN 3.8 11/13/2015   ALBUMIN 3.1* 09/13/2013   BILITOT 0.6 11/13/2015   BILITOT 0.5 09/13/2013   ALKPHOS 52 11/13/2015   ALKPHOS 84 09/13/2013   AST 20 11/13/2015   AST 20 09/13/2013   ALT 25 11/13/2015   ALT 36 09/13/2013  .   Total Time in preparing paper work, data evaluation and todays exam - 35 minutes  Nabeel Gladson M.D on 11/15/2015 at 2:56 PM    Note: This dictation was prepared with Dragon dictation along with smaller phrase technology. Any transcriptional errors that result from this process are unintentional.

## 2015-11-15 NOTE — Progress Notes (Signed)
     Eric GandyWilliam Lyons was admitted to the Hospital on 11/13/2015 and Discharged  11/15/2015 and should be excused from work/school   for 3 days starting 11/13/2015 , may return to work/school without any restrictions.    [name],Eric Lyons M.D on 11/15/2015,at 11:23 AM

## 2015-11-15 NOTE — Discharge Instructions (Signed)
Asthma, Adult Asthma is a recurring condition in which the airways tighten and narrow. Asthma can make it difficult to breathe. It can cause coughing, wheezing, and shortness of breath. Asthma episodes, also called asthma attacks, range from minor to life-threatening. Asthma cannot be cured, but medicines and lifestyle changes can help control it. CAUSES Asthma is believed to be caused by inherited (genetic) and environmental factors, but its exact cause is unknown. Asthma may be triggered by allergens, lung infections, or irritants in the air. Asthma triggers are different for each person. Common triggers include:   Animal dander.  Dust mites.  Cockroaches.  Pollen from trees or grass.  Mold.  Smoke.  Air pollutants such as dust, household cleaners, hair sprays, aerosol sprays, paint fumes, strong chemicals, or strong odors.  Cold air, weather changes, and winds (which increase molds and pollens in the air).  Strong emotional expressions such as crying or laughing hard.  Stress.  Certain medicines (such as aspirin) or types of drugs (such as beta-blockers).  Sulfites in foods and drinks. Foods and drinks that may contain sulfites include dried fruit, potato chips, and sparkling grape juice.  Infections or inflammatory conditions such as the flu, a cold, or an inflammation of the nasal membranes (rhinitis).  Gastroesophageal reflux disease (GERD).  Exercise or strenuous activity. SYMPTOMS Symptoms may occur immediately after asthma is triggered or many hours later. Symptoms include:  Wheezing.  Excessive nighttime or early morning coughing.  Frequent or severe coughing with a common cold.  Chest tightness.  Shortness of breath. DIAGNOSIS  The diagnosis of asthma is made by a review of your medical history and a physical exam. Tests may also be performed. These may include:  Lung function studies. These tests show how much air you breathe in and out.  Allergy  tests.  Imaging tests such as X-rays. TREATMENT  Asthma cannot be cured, but it can usually be controlled. Treatment involves identifying and avoiding your asthma triggers. It also involves medicines. There are 2 classes of medicine used for asthma treatment:   Controller medicines. These prevent asthma symptoms from occurring. They are usually taken every day.  Reliever or rescue medicines. These quickly relieve asthma symptoms. They are used as needed and provide short-term relief. Your health care provider will help you create an asthma action plan. An asthma action plan is a written plan for managing and treating your asthma attacks. It includes a list of your asthma triggers and how they may be avoided. It also includes information on when medicines should be taken and when their dosage should be changed. An action plan may also involve the use of a device called a peak flow meter. A peak flow meter measures how well the lungs are working. It helps you monitor your condition. HOME CARE INSTRUCTIONS   Take medicines only as directed by your health care provider. Speak with your health care provider if you have questions about how or when to take the medicines.  Use a peak flow meter as directed by your health care provider. Record and keep track of readings.  Understand and use the action plan to help minimize or stop an asthma attack without needing to seek medical care.  Control your home environment in the following ways to help prevent asthma attacks:  Do not smoke. Avoid being exposed to secondhand smoke.  Change your heating and air conditioning filter regularly.  Limit your use of fireplaces and wood stoves.  Get rid of pests (such as roaches   and mice) and their droppings.  Throw away plants if you see mold on them.  Clean your floors and dust regularly. Use unscented cleaning products.  Try to have someone else vacuum for you regularly. Stay out of rooms while they are  being vacuumed and for a short while afterward. If you vacuum, use a dust mask from a hardware store, a double-layered or microfilter vacuum cleaner bag, or a vacuum cleaner with a HEPA filter.  Replace carpet with wood, tile, or vinyl flooring. Carpet can trap dander and dust.  Use allergy-proof pillows, mattress covers, and box spring covers.  Wash bed sheets and blankets every week in hot water and dry them in a dryer.  Use blankets that are made of polyester or cotton.  Clean bathrooms and kitchens with bleach. If possible, have someone repaint the walls in these rooms with mold-resistant paint. Keep out of the rooms that are being cleaned and painted.  Wash hands frequently. SEEK MEDICAL CARE IF:   You have wheezing, shortness of breath, or a cough even if taking medicine to prevent attacks.  The colored mucus you cough up (sputum) is thicker than usual.  Your sputum changes from clear or white to yellow, green, gray, or bloody.  You have any problems that may be related to the medicines you are taking (such as a rash, itching, swelling, or trouble breathing).  You are using a reliever medicine more than 2-3 times per week.  Your peak flow is still at 50-79% of your personal best after following your action plan for 1 hour.  You have a fever. SEEK IMMEDIATE MEDICAL CARE IF:   You seem to be getting worse and are unresponsive to treatment during an asthma attack.  You are short of breath even at rest.  You get short of breath when doing very little physical activity.  You have difficulty eating, drinking, or talking due to asthma symptoms.  You develop chest pain.  You develop a fast heartbeat.  You have a bluish color to your lips or fingernails.  You are light-headed, dizzy, or faint.  Your peak flow is less than 50% of your personal best.   This information is not intended to replace advice given to you by your health care provider. Make sure you discuss any  questions you have with your health care provider.   Document Released: 12/16/2005 Document Revised: 09/06/2015 Document Reviewed: 07/15/2013 Elsevier Interactive Patient Education 2016 [employer]  

## 2016-03-27 ENCOUNTER — Encounter: Payer: Self-pay | Admitting: Emergency Medicine

## 2016-03-27 ENCOUNTER — Emergency Department: Payer: Medicare Other

## 2016-03-27 ENCOUNTER — Emergency Department
Admission: EM | Admit: 2016-03-27 | Discharge: 2016-03-27 | Disposition: A | Discharge status: Home / Self Care | Payer: Medicare Other | Attending: Emergency Medicine | Admitting: Emergency Medicine

## 2016-03-27 DIAGNOSIS — Z79899 Other long term (current) drug therapy: Secondary | ICD-10-CM | POA: Diagnosis not present

## 2016-03-27 DIAGNOSIS — J181 Lobar pneumonia, unspecified organism: Secondary | ICD-10-CM | POA: Insufficient documentation

## 2016-03-27 DIAGNOSIS — J45909 Unspecified asthma, uncomplicated: Secondary | ICD-10-CM | POA: Diagnosis not present

## 2016-03-27 DIAGNOSIS — J441 Chronic obstructive pulmonary disease with (acute) exacerbation: Secondary | ICD-10-CM | POA: Insufficient documentation

## 2016-03-27 DIAGNOSIS — Z7952 Long term (current) use of systemic steroids: Secondary | ICD-10-CM | POA: Insufficient documentation

## 2016-03-27 DIAGNOSIS — Z792 Long term (current) use of antibiotics: Secondary | ICD-10-CM | POA: Insufficient documentation

## 2016-03-27 DIAGNOSIS — J069 Acute upper respiratory infection, unspecified: Secondary | ICD-10-CM | POA: Diagnosis present

## 2016-03-27 DIAGNOSIS — J189 Pneumonia, unspecified organism: Secondary | ICD-10-CM

## 2016-03-27 MED ORDER — METHYLPREDNISOLONE SODIUM SUCC 125 MG IJ SOLR
125.0000 mg | Freq: Once | INTRAMUSCULAR | Status: AC
  Administered 2016-03-27: 125 mg via INTRAVENOUS
  Filled 2016-03-27: qty 2

## 2016-03-27 MED ORDER — ALBUTEROL SULFATE (2.5 MG/3ML) 0.083% IN NEBU
2.5000 mg | INHALATION_SOLUTION | Freq: Once | RESPIRATORY_TRACT | Status: AC
  Administered 2016-03-27: 2.5 mg via RESPIRATORY_TRACT
  Filled 2016-03-27: qty 3

## 2016-03-27 MED ORDER — IPRATROPIUM-ALBUTEROL 0.5-2.5 (3) MG/3ML IN SOLN
6.0000 mL | Freq: Once | RESPIRATORY_TRACT | Status: AC
  Administered 2016-03-27: 6 mL via RESPIRATORY_TRACT
  Filled 2016-03-27: qty 6

## 2016-03-27 MED ORDER — LEVOFLOXACIN 750 MG PO TABS: 750.0000 mg | ORAL_TABLET | Freq: Every day | ORAL | Status: AC

## 2016-03-27 MED ORDER — IPRATROPIUM-ALBUTEROL 0.5-2.5 (3) MG/3ML IN SOLN
RESPIRATORY_TRACT | Status: AC
  Filled 2016-03-27: qty 3

## 2016-03-27 NOTE — ED Notes (Signed)
Pt reports cough and congestion x3 days, denies smoking.

## 2016-03-27 NOTE — ED Provider Notes (Signed)
[facility] Emergency Department Provider Note  ____________________________________________  Time seen: Approximately 6:31 PM  I have reviewed the triage vital signs and the nursing notes.   HISTORY  Chief Complaint URI    HPI Eric Lyons is a 50 y.o. male who presents to emergency department complaining of a "cold that has moved to my chest." Patient states that he started off with some minor nasal congestion and sore throat and now has progressed to shortness of breath, coughing. Patient states that he has a significant history of COPD/asthma/chronic bronchitis. Patient states that "whenever I get a chest cold and "pneumonia." Patient has not take any medications prior to arrival. Patient denies any headache, neck pain, chest pain, abdominal pain, nausea or vomiting.    Patient has a history of allergic reaction/anaphylaxis to prednisone. Per the patient he can take Solu-Medrol with no issues.   Past Medical History  Diagnosis Date  . Asthma   . COPD (chronic obstructive pulmonary disease) [facility])     Patient Active Problem List   Diagnosis Date Noted  . COPD exacerbation (HCC) 11/13/2015  . Hypoxia 11/13/2015    Past Surgical History  Procedure Laterality Date  . Ankle fracture surgery    . Hernia repair    . Tonsillectomy    . Knee arthroscopy      Current Outpatient Rx  Name  Route  Sig  Dispense  Refill  . albuterol (VENTOLIN HFA) 108 (90 BASE) MCG/ACT inhaler   Inhalation   Inhale 2 puffs into the lungs every 4 (four) hours as needed for wheezing.          Marland Kitchen azithromycin (ZITHROMAX) 500 MG tablet   Oral   Take 1 tablet (500 mg total) by mouth daily.   3 tablet   0   . Fluticasone-Salmeterol (ADVAIR DISKUS) 500-50 MCG/DOSE AEPB   Inhalation   Inhale 1 puff into the lungs 2 (two) times daily at 10 AM and 5 PM.   1 each   0   . Ipratropium-Albuterol (COMBIVENT) 20-100 MCG/ACT AERS respimat   Inhalation   Inhale 2 puffs  into the lungs 4 (four) times daily.         Marland Kitchen levofloxacin (LEVAQUIN) 750 MG tablet   Oral   Take 1 tablet (750 mg total) by mouth daily.   14 tablet   0   . predniSONE (STERAPRED UNI-PAK 21 TAB) 10 MG (21) TBPK tablet   Oral   Take 1 tablet (10 mg total) by mouth daily. 3 tab daily for 3 days 2 tab daily for 2 days 1 tab daily for 2 days   15 tablet   0   . vardenafil (LEVITRA) 20 MG tablet   Oral   Take 20 mg by mouth daily as needed for erectile dysfunction.            Allergies Prednisone and Fentanyl  Family History  Problem Relation Age of Onset  . Alcohol abuse Mother   . ALS Father     Social History Social History  Substance Use Topics  . Smoking status: Never Smoker   . Smokeless tobacco: Never Used  . Alcohol Use: No     Review of Systems  Constitutional: No fever/chills Eyes: No visual changes. No discharge ENT: Mild sore throat. Mild nasal congestion. Cardiovascular: no chest pain. Respiratory: Positive for cough. Positive for wheezing. Positive for shortness of breath. Gastrointestinal: No abdominal pain.  No nausea, no vomiting.   Skin: Negative for  rash. Neurological: Negative for headaches, focal weakness or numbness. 10-point ROS otherwise negative.  ____________________________________________   PHYSICAL EXAM:  VITAL SIGNS: ED Triage Vitals  Enc Vitals Group     BP 03/27/16 1754 122/71 mmHg     Pulse Rate 03/27/16 1753 97     Resp 03/27/16 1753 16     Temp 03/27/16 1753 99.1 F (37.3 C)     Temp Source 03/27/16 1753 Oral     SpO2 03/27/16 1754 96 %     Weight 03/27/16 1753 226 lb (102.513 kg)     Height 03/27/16 1753 5\' 5"  (1.651 m)     Head Cir --      Peak Flow --      Pain Score 03/27/16 1754 7     Pain Loc --      Pain Edu? --      Excl. in GC? --      Constitutional: Alert and oriented. Well appearing and in no acute distress. Eyes: Conjunctivae are normal. PERRL. EOMI. Head: Atraumatic. ENT:      Ears: EACs  and TMs are unremarkable bilaterally.      Nose: No congestion/rhinnorhea.      Mouth/Throat: Mucous membranes are moist. Oropharynx is mildly erythematous but nonedematous. Tonsils are unremarkable bilaterally. Neck: No stridor.   Hematological/Lymphatic/Immunilogical: No cervical lymphadenopathy. Cardiovascular: Normal rate, regular rhythm. Normal S1 and S2.  Good peripheral circulation. Respiratory: Normal respiratory effort without tachypnea or retractions. Lungs with significant respiratory and expiratory wheezing. No rales or rhonchi. Good air entry into the bases. Gastrointestinal: Soft and nontender. No distention. No CVA tenderness. Neurologic:  Normal speech and language. No gross focal neurologic deficits are appreciated.  Skin:  Skin is warm, dry and intact. No rash noted. Psychiatric: Mood and affect are normal. Speech and behavior are normal. Patient exhibits appropriate insight and judgement.   ____________________________________________   LABS (all labs ordered are listed, but only abnormal results are displayed)  Labs Reviewed - No data to display ____________________________________________  EKG   ____________________________________________  RADIOLOGY Eric Lyons, Eric Lyons, personally viewed and evaluated these images (plain radiographs) as part of my medical decision making, as well as reviewing the written report by the radiologist.  Dg Chest 2 View  03/27/2016  CLINICAL DATA:  Cough and congestion for 3 days EXAM: CHEST  2 VIEW COMPARISON:  November 13, 2015 FINDINGS: There is mild patchy infiltrate in the left base. Lungs elsewhere are clear. The heart size and pulmonary vascularity are normal. No adenopathy. No bone lesions. IMPRESSION: Small area of patchy infiltrate left base.  Lungs elsewhere clear. Followup PA and lateral chest radiographs recommended in 3-4 weeks following trial of antibiotic therapy to ensure resolution and exclude underlying malignancy.  Electronically Signed   By: Bretta BangWilliam  Woodruff III M.D.   On: 03/27/2016 19:02    ____________________________________________    PROCEDURES  Procedure(s) performed:       Medications  albuterol (PROVENTIL) (2.5 MG/3ML) 0.083% nebulizer solution 2.5 mg (2.5 mg Nebulization Given 03/27/16 1858)  methylPREDNISolone sodium succinate (SOLU-MEDROL) 125 mg/2 mL injection 125 mg (125 mg Intravenous Given 03/27/16 1848)  ipratropium-albuterol (DUONEB) 0.5-2.5 (3) MG/3ML nebulizer solution 6 mL (6 mLs Nebulization Given 03/27/16 1941)     ____________________________________________   INITIAL IMPRESSION / ASSESSMENT AND PLAN / ED COURSE  Pertinent labs & imaging results that were available during my care of the patient were reviewed by me and considered in my medical decision making (see chart for details).  Patient's  diagnosis is consistent with left lower lobe pneumonia. Patient had significant wheezing upon arrival. He was given albuterol, Solu-Medrol, and Combivent. Reexamination reveals a fast improvement of adventitious lung sounds. Patient still has scattered wheezing.. Patient will be discharged home with prescriptions for Levaquin. Patient is to continue his at home inhaled medications.. Patient is to follow up with primary care provider in one month for repeat chest x-ray. Patient is given ED precautions to return to the ED for any worsening or new symptoms.     ____________________________________________  FINAL CLINICAL IMPRESSION(S) / ED DIAGNOSES  Final diagnoses:  Left lower lobe pneumonia      NEW MEDICATIONS STARTED DURING THIS VISIT:  New Prescriptions   LEVOFLOXACIN (LEVAQUIN) 750 MG TABLET    Take 1 tablet (750 mg total) by mouth daily.        This chart was dictated using voice recognition software/Dragon. Despite best efforts to proofread, errors can occur which can change the meaning. Any change was purely unintentional.    Racheal Patches,  PA-C 03/27/16 2009  Rockne Menghini, MD 03/27/16 2303

## 2016-03-27 NOTE — Discharge Instructions (Signed)

## 2016-12-06 ENCOUNTER — Encounter: Payer: Self-pay | Admitting: Intensive Care

## 2016-12-06 ENCOUNTER — Emergency Department: Payer: Medicare Other

## 2016-12-06 ENCOUNTER — Inpatient Hospital Stay
Admission: EM | Admit: 2016-12-06 | Discharge: 2016-12-08 | DRG: 190 | Disposition: A | Discharge status: Home Health | Payer: Medicare Other | Attending: Specialist | Admitting: Specialist

## 2016-12-06 DIAGNOSIS — T380X5A Adverse effect of glucocorticoids and synthetic analogues, initial encounter: Secondary | ICD-10-CM | POA: Diagnosis present

## 2016-12-06 DIAGNOSIS — Z7951 Long term (current) use of inhaled steroids: Secondary | ICD-10-CM | POA: Diagnosis not present

## 2016-12-06 DIAGNOSIS — E669 Obesity, unspecified: Secondary | ICD-10-CM | POA: Diagnosis present

## 2016-12-06 DIAGNOSIS — W19XXXA Unspecified fall, initial encounter: Secondary | ICD-10-CM | POA: Diagnosis present

## 2016-12-06 DIAGNOSIS — Y939 Activity, unspecified: Secondary | ICD-10-CM

## 2016-12-06 DIAGNOSIS — J441 Chronic obstructive pulmonary disease with (acute) exacerbation: Secondary | ICD-10-CM | POA: Diagnosis not present

## 2016-12-06 DIAGNOSIS — R739 Hyperglycemia, unspecified: Secondary | ICD-10-CM | POA: Diagnosis present

## 2016-12-06 DIAGNOSIS — J4542 Moderate persistent asthma with status asthmaticus: Secondary | ICD-10-CM | POA: Diagnosis present

## 2016-12-06 DIAGNOSIS — J9601 Acute respiratory failure with hypoxia: Secondary | ICD-10-CM | POA: Diagnosis present

## 2016-12-06 DIAGNOSIS — Y999 Unspecified external cause status: Secondary | ICD-10-CM | POA: Diagnosis not present

## 2016-12-06 DIAGNOSIS — R296 Repeated falls: Secondary | ICD-10-CM | POA: Diagnosis present

## 2016-12-06 DIAGNOSIS — Z9181 History of falling: Secondary | ICD-10-CM | POA: Diagnosis not present

## 2016-12-06 DIAGNOSIS — J45901 Unspecified asthma with (acute) exacerbation: Secondary | ICD-10-CM | POA: Diagnosis present

## 2016-12-06 DIAGNOSIS — J40 Bronchitis, not specified as acute or chronic: Secondary | ICD-10-CM | POA: Diagnosis present

## 2016-12-06 DIAGNOSIS — Y92019 Unspecified place in single-family (private) house as the place of occurrence of the external cause: Secondary | ICD-10-CM | POA: Diagnosis not present

## 2016-12-06 DIAGNOSIS — Z888 Allergy status to other drugs, medicaments and biological substances status: Secondary | ICD-10-CM | POA: Diagnosis not present

## 2016-12-06 DIAGNOSIS — Z79899 Other long term (current) drug therapy: Secondary | ICD-10-CM | POA: Diagnosis not present

## 2016-12-06 DIAGNOSIS — Z6836 Body mass index (BMI) 36.0-36.9, adult: Secondary | ICD-10-CM | POA: Diagnosis not present

## 2016-12-06 DIAGNOSIS — J4541 Moderate persistent asthma with (acute) exacerbation: Secondary | ICD-10-CM | POA: Diagnosis present

## 2016-12-06 DIAGNOSIS — Z820 Family history of epilepsy and other diseases of the nervous system: Secondary | ICD-10-CM

## 2016-12-06 DIAGNOSIS — S301XXA Contusion of abdominal wall, initial encounter: Secondary | ICD-10-CM | POA: Diagnosis present

## 2016-12-06 DIAGNOSIS — Z9989 Dependence on other enabling machines and devices: Secondary | ICD-10-CM | POA: Diagnosis not present

## 2016-12-06 DIAGNOSIS — Z811 Family history of alcohol abuse and dependence: Secondary | ICD-10-CM

## 2016-12-06 DIAGNOSIS — Z885 Allergy status to narcotic agent status: Secondary | ICD-10-CM

## 2016-12-06 DIAGNOSIS — Z7722 Contact with and (suspected) exposure to environmental tobacco smoke (acute) (chronic): Secondary | ICD-10-CM | POA: Diagnosis present

## 2016-12-06 DIAGNOSIS — Y92230 Patient room in hospital as the place of occurrence of the external cause: Secondary | ICD-10-CM | POA: Diagnosis present

## 2016-12-06 DIAGNOSIS — R0602 Shortness of breath: Secondary | ICD-10-CM | POA: Diagnosis not present

## 2016-12-06 LAB — BASIC METABOLIC PANEL
Anion gap: 6 (ref 5–15)
BUN: 12 mg/dL (ref 6–20)
CO2: 40 mmol/L — ABNORMAL HIGH (ref 22–32)
Calcium: 8.9 mg/dL (ref 8.9–10.3)
Chloride: 92 mmol/L — ABNORMAL LOW (ref 101–111)
Creatinine, Ser: 0.87 mg/dL (ref 0.61–1.24)
GFR calc Af Amer: >60 mL/min (ref >60)
GFR calc non Af Amer: >60 mL/min (ref >60)
Glucose, Bld: 125 mg/dL — ABNORMAL HIGH (ref 65–99)
Potassium: 4 mmol/L (ref 3.5–5.1)
Sodium: 138 mmol/L (ref 135–145)

## 2016-12-06 LAB — CBC
HCT: 50.6 % (ref 40.0–52.0)
Hemoglobin: 16.6 g/dL (ref 13.0–18.0)
MCH: 28.7 pg (ref 26.0–34.0)
MCHC: 32.7 g/dL (ref 32.0–36.0)
MCV: 87.8 fL (ref 80.0–100.0)
Platelets: 217 10*3/uL (ref 150–440)
RBC: 5.77 MIL/uL (ref 4.40–5.90)
RDW: 14.8 % — ABNORMAL HIGH (ref 11.5–14.5)
WBC: 9.5 10*3/uL (ref 3.8–10.6)

## 2016-12-06 LAB — BLOOD GAS, VENOUS
Acid-Base Excess: 12.1 mmol/L — ABNORMAL HIGH (ref 0.0–2.0)
Bicarbonate: 44.6 mmol/L — ABNORMAL HIGH (ref 20.0–28.0)
O2 Saturation: 86.8 %
Patient temperature: 37
pCO2, Ven: 109 mmHg (ref 44.0–60.0)
pH, Ven: 7.22 — ABNORMAL LOW (ref 7.250–7.430)
pO2, Ven: 63 mmHg — ABNORMAL HIGH (ref 32.0–45.0)

## 2016-12-06 LAB — BRAIN NATRIURETIC PEPTIDE: B Natriuretic Peptide: 36 pg/mL (ref 0.0–100.0)

## 2016-12-06 LAB — MAGNESIUM: Magnesium: 1.8 mg/dL (ref 1.7–2.4)

## 2016-12-06 MED ORDER — IPRATROPIUM-ALBUTEROL 0.5-2.5 (3) MG/3ML IN SOLN
3.0000 mL | Freq: Once | RESPIRATORY_TRACT | Status: AC
  Administered 2016-12-06: 3 mL via RESPIRATORY_TRACT
  Filled 2016-12-06: qty 3

## 2016-12-06 MED ORDER — IOPAMIDOL (ISOVUE-300) INJECTION 61%
100.0000 mL | Freq: Once | INTRAVENOUS | Status: AC | PRN
  Administered 2016-12-06: 100 mL via INTRAVENOUS

## 2016-12-06 MED ORDER — IPRATROPIUM-ALBUTEROL 0.5-2.5 (3) MG/3ML IN SOLN
RESPIRATORY_TRACT | Status: AC
  Administered 2016-12-06: 3 mL via RESPIRATORY_TRACT
  Filled 2016-12-06: qty 3

## 2016-12-06 MED ORDER — ENOXAPARIN SODIUM 40 MG/0.4ML ~~LOC~~ SOLN
40.0000 mg | SUBCUTANEOUS | Status: DC
  Administered 2016-12-06 – 2016-12-07 (×2): 40 mg via SUBCUTANEOUS
  Filled 2016-12-06 (×2): qty 0.4

## 2016-12-06 MED ORDER — AZITHROMYCIN 250 MG PO TABS
250.0000 mg | ORAL_TABLET | Freq: Every day | ORAL | Status: DC
  Administered 2016-12-06 – 2016-12-07 (×2): 250 mg via ORAL
  Filled 2016-12-06 (×3): qty 1

## 2016-12-06 MED ORDER — ONDANSETRON HCL 4 MG/2ML IJ SOLN: 4.0000 mg | Freq: Four times a day (QID) | INTRAMUSCULAR | Status: DC | PRN

## 2016-12-06 MED ORDER — METHYLPREDNISOLONE SODIUM SUCC 40 MG IJ SOLR
40.0000 mg | Freq: Four times a day (QID) | INTRAMUSCULAR | Status: DC
  Administered 2016-12-06 – 2016-12-07 (×3): 40 mg via INTRAVENOUS
  Filled 2016-12-06 (×3): qty 1

## 2016-12-06 MED ORDER — IPRATROPIUM-ALBUTEROL 0.5-2.5 (3) MG/3ML IN SOLN
3.0000 mL | Freq: Four times a day (QID) | RESPIRATORY_TRACT | Status: DC
  Administered 2016-12-07 – 2016-12-08 (×6): 3 mL via RESPIRATORY_TRACT
  Filled 2016-12-06 (×6): qty 3

## 2016-12-06 MED ORDER — ONDANSETRON HCL 4 MG PO TABS
4.0000 mg | ORAL_TABLET | Freq: Four times a day (QID) | ORAL | Status: DC | PRN
Start: 2016-12-06 — End: 2016-12-08

## 2016-12-06 MED ORDER — ACETAMINOPHEN 650 MG RE SUPP: 650.0000 mg | Freq: Four times a day (QID) | RECTAL | Status: DC | PRN

## 2016-12-06 MED ORDER — ACETAMINOPHEN 325 MG PO TABS
650.0000 mg | ORAL_TABLET | Freq: Four times a day (QID) | ORAL | Status: DC | PRN
  Administered 2016-12-07 – 2016-12-08 (×2): 650 mg via ORAL
  Filled 2016-12-06 (×3): qty 2

## 2016-12-06 MED ORDER — BUDESONIDE 0.5 MG/2ML IN SUSP
0.5000 mg | Freq: Two times a day (BID) | RESPIRATORY_TRACT | Status: DC
  Administered 2016-12-07 – 2016-12-08 (×3): 0.5 mg via RESPIRATORY_TRACT
  Filled 2016-12-06 (×3): qty 2

## 2016-12-06 MED ORDER — METHYLPREDNISOLONE SODIUM SUCC 125 MG IJ SOLR
125.0000 mg | Freq: Once | INTRAMUSCULAR | Status: AC
  Administered 2016-12-06: 125 mg via INTRAVENOUS
  Filled 2016-12-06: qty 2

## 2016-12-06 NOTE — Plan of Care (Signed)
Problem: Safety: Goal: Ability to remain free from injury will improve Outcome: Progressing Pt has had multiple falls at home due to hypoxia. Pt refuses bed alarm. He was educated to call for assistance when he needs to get up

## 2016-12-06 NOTE — ED Triage Notes (Addendum)
PAtient arrived by POV from home for c/o COPD exacerbation. Patient reported SOB X2 days. Does not wear Oxygen at home. Patient was 85% O2 on RoomAir. A&O X4. PAtient denies pain at this time. Breathing labored

## 2016-12-06 NOTE — Progress Notes (Signed)
Pt refuses bed alarm. Pt educated on the need to keep alarm on. He verbalized he understood and still didn;t want bed alarm on

## 2016-12-06 NOTE — ED Notes (Signed)
Attempted to call report, nurses in report now and unavailable.

## 2016-12-06 NOTE — ED Provider Notes (Signed)
Time Seen: Approximately 1447  I have reviewed the triage notes  Chief Complaint: COPD and Shortness of Breath   History of Present Illness: Eric Lyons is a 50 y.o. male who presents with a history of asthma. States he does not smoke it's been exposed to secondhand smoke for extended period of time. Patient uses some hours and Advair at home. He states he ran out of his Advair approximately 1 week ago when his symptoms started. He is been using his nebulizer at home with the last treatment approximately 2 hours prior to arrival. He denies any fever but has had a productive cough with yellow to dark colored sputum. He denies any hemoptysis. Denies any new leg pain or swelling. He denies any chest pain   Past Medical History:  Diagnosis Date  . Asthma   . COPD (chronic obstructive pulmonary disease) [facility])     Patient Active Problem List   Diagnosis Date Noted  . COPD exacerbation (HCC) 11/13/2015  . Hypoxia 11/13/2015    Past Surgical History:  Procedure Laterality Date  . ankle fracture surgery    . HERNIA REPAIR    . KNEE ARTHROSCOPY    . TONSILLECTOMY      Past Surgical History:  Procedure Laterality Date  . ankle fracture surgery    . HERNIA REPAIR    . KNEE ARTHROSCOPY    . TONSILLECTOMY      Current Outpatient Rx  . Order #: [id number] Class: Historical Med  . Order #: [id number] Class: Normal  . Order #: [id number] Class: Normal  . Order #: [id number] Class: Historical Med  . Order #: [id number] Class: Normal  . Order #: [id number] Class: Historical Med    Allergies:  Prednisone and Fentanyl  Family History: Family History  Problem Relation Age of Onset  . Alcohol abuse Mother   . ALS Father     Social History: Social History  Substance Use Topics  . Smoking status: Never Smoker  . Smokeless tobacco: Never Used  . Alcohol use No     Review of Systems:   10 point review of systems was performed and was otherwise negative:  Constitutional: No  fever Eyes: No visual disturbances ENT: No sore throat, ear pain Cardiac: No chest pain Respiratory: Shortness of breath for approximately a week with some audible wheezing at home. No stridor Abdomen: No abdominal pain, no vomiting, No diarrhea Endocrine: No weight loss, No night sweats Extremities: No new peripheral edema, cyanosis Skin: No rashes, easy bruising Neurologic: No focal weakness, trouble with speech or swollowing Urologic: No dysuria, Hematuria, or urinary frequency   Physical Exam:  ED Triage Vitals  Enc Vitals Group     BP 12/06/16 1414 137/76     Pulse Rate 12/06/16 1414 94     Resp 12/06/16 1414 (!) 26     Temp 12/06/16 1414 98.1 F (36.7 C)     Temp Source 12/06/16 1414 Oral     SpO2 12/06/16 1414 (!) 85 %     Weight 12/06/16 1415 224 lb (101.6 kg)     Height 12/06/16 1415 5\' 6"  (1.676 m)     Head Circumference --      Peak Flow --      Pain Score --      Pain Loc --      Pain Edu? --      Excl. in GC? --     General: Awake , Alert , and Oriented times 3; GCS 15 Patient's tachypnea can speaks in  interrupted sentences. Some mild audible wheezing at the bedside. Head: Normal cephalic , atraumatic Eyes: Pupils equal , round, reactive to light Nose/Throat: No nasal drainage, patent upper airway without erythema or exudate.  Neck: Supple, Full range of motion, No anterior adenopathy or palpable thyroid masses Lungs: Clear to ascultation without wheezes , rhonchi, or rales Heart: Regular rate, regular rhythm without murmurs , gallops , or rubs Abdomen: Obese Soft, non tender without rebound, guarding , or rigidity; bowel sounds positive and symmetric in all 4 quadrants. No organomegaly .        Extremities: 2 plus symmetric pulses. Mild circumferential edema in both lower extremities, no clubbing or cyanosis Neurologic: normal ambulation, Motor symmetric without deficits, sensory intact Skin: warm, dry, no rashes   Labs:   All laboratory work was  reviewed including any pertinent negatives or positives listed below:  Labs Reviewed  CBC - Abnormal; Notable for the following:       Result Value   RDW 14.8 (*)    All other components within normal limits  BASIC METABOLIC PANEL  BRAIN NATRIURETIC PEPTIDE    EKG:  ED ECG REPORT I, Jennye MoccasinBrian S Quigley, the attending physician, personally viewed and interpreted this ECG.  Date: 12/06/2016 EKG Time: 1434Rate: 93 Rhythm: normal sinus rhythm QRS Axis: normal Intervals: normal ST/T Wave abnormalities: normal Conduction Disturbances: none Narrative Interpretation: unremarkable No acute ischemic changes   Radiology:  "Ct Abdomen Pelvis W Contrast  Result Date: 12/06/2016 CLINICAL DATA:  Mid abdominal pain. Patient passed out and fell onto a table. EXAM: CT ABDOMEN AND PELVIS WITH CONTRAST TECHNIQUE: Multidetector CT imaging of the abdomen and pelvis was performed using the standard protocol following bolus administration of intravenous contrast. CONTRAST:  100mL ISOVUE-300 IOPAMIDOL (ISOVUE-300) INJECTION 61% COMPARISON:  None. FINDINGS: Lower chest: Mild dependent bibasilar opacities are favored to represent atelectasis with infiltrate considered less likely. Recommend clinical correlation. Lung bases are otherwise unremarkable. Hepatobiliary: The gallbladder is poorly distended but unremarkable. The liver and portal vein are within normal limits. Pancreas: Unremarkable. No pancreatic ductal dilatation or surrounding inflammatory changes. Spleen: Normal in size without focal abnormality. Adrenals/Urinary Tract: The adrenal glands are normal. The left kidney is normal. Probable tiny cysts in the right kidney, too small to characterize. No renal obstruction. Stomach/Bowel: The stomach and small bowel are normal. Colonic diverticuli are seen without diverticulitis. The appendix is normal. Vascular/Lymphatic: No significant vascular findings are present. No enlarged abdominal or pelvic lymph nodes.  Reproductive: Prostate is unremarkable. Other: No abdominal wall hernia or abnormality. No abdominopelvic ascites. Musculoskeletal: Mild anterior wedging of T10 and T11 is favored to be nonacute. No acute bony abnormalities are identified. IMPRESSION: 1. Mild dependent bibasilar opacities favored represent atelectasis rather than infiltrate. Recommend clinical correlation. 2. Mild anterior wedging of T10 and T11 is favored to be nonacute. Recommend clinical correlation. 3. No other acute abnormalities. Electronically Signed   By: Gerome Samavid  Williams III M.D   On: 12/06/2016 17:08   Dg Chest Port 1 View  Result Date: 12/06/2016 CLINICAL DATA:  Shortness of breath for 2 days. EXAM: PORTABLE CHEST 1 VIEW COMPARISON:  03/27/2016 FINDINGS: Mild enlargement of the cardiopericardial silhouette indistinct density at the left lung base appears stable and probably from scarring along the lingula. The lungs appear otherwise clear. IMPRESSION: 1. Mild enlargement of the cardiopericardial silhouette, without edema. 2. Bandlike density at the left lung base, similar to prior, probably from lingular scarring. Electronically Signed   By: Annitta NeedsWalter  Liebkemann M.D.  On: 12/06/2016 15:03  "  I personally reviewed the radiologic studies CRITICAL CARE Performed by: Jennye MoccasinBrian S Quigley   Total critical care time: 35 minutes  Critical care time was exclusive of separately billable procedures and treating other patients.  Critical care was necessary to treat or prevent imminent or life-threatening deterioration.  Critical care was time spent personally by me on the following activities: development of treatment plan with patient and/or surrogate as well as nursing, discussions with consultants, evaluation of patient's response to treatment, examination of patient, obtaining history from patient or surrogate, ordering and performing treatments and interventions, ordering and review of laboratory studies, ordering and review of  radiographic studies, pulse oximetry and re-evaluation of patient's condition.   P  ED Course:  Patient's stay here showed only slight symptomatic improvement with 3 consecutive duo nebs. He was also started on IV steroids. Patient arrives hypoxic and appears to be CO2 retaining her venous blood gas. His episodes here especially when he falls asleep his pulse ox would drop down into the high 70s and low 80s even on a 4 L nasal cannula. He does not seem to be ill enough at this time to start up a BiPAP and that may be the next step for the patient. CAT scan of the abdomen was performed due to noted abdominal contusions and swelling per the nursing staff with a history of recurrent falls. I felt most likely was falling due to CO2 retention. There does not appear to be any traumatic injury. CAT scan evaluation. Clinical Course      Assessment:  Status asthmaticus Mild abdominal trauma      Plan: * Inpatient management            Jennye MoccasinBrian S Quigley, MD 12/06/16 1820

## 2016-12-06 NOTE — ED Notes (Signed)
Patient transported to CT 

## 2016-12-06 NOTE — H&P (Signed)
[facility] [place] [facility]   PATIENT NAME: Eric Lyons    MR#:  [id number]  DATE OF BIRTH:  Feb 17, 1966  DATE OF ADMISSION:  12/06/2016  PRIMARY CARE PHYSICIAN: WHITE, [name], NP   REQUESTING/REFERRING PHYSICIAN: Dr. Lacretia NicksBrian Quigley  CHIEF COMPLAINT:   Chief Complaint  Patient presents with  . COPD  . Shortness of Breath    HISTORY OF PRESENT ILLNESS:  Eric Lyons  is a 50 y.o. male with a known history of COPD/asthma, obesity who presents to the hospital due to shortness of breath and wheezing. Patient says that he has been having significant shortness of breath over the past week progressively getting worse. He now has significant shortness of breath even on minimal exertion going from the bedroom to the bathroom. He also says that he has fallen multiple times due to shortness of breath at home. Since his symptoms were not improving and came to the ER for further evaluation and was noted to be in acute respiratory failure with hypoxia secondary to asthma/COPD exacerbation and hospitalist services were contacted further treatment and evaluation. Patient admits to a cough which is nonproductive. He denies any fever, no nausea, vomiting, abdominal pain, melena, hematochezia or any other associated symptoms presently.  PAST MEDICAL HISTORY:   Past Medical History:  Diagnosis Date  . Asthma   . COPD (chronic obstructive pulmonary disease) (HCC)     PAST SURGICAL HISTORY:   Past Surgical History:  Procedure Laterality Date  . ankle fracture surgery    . HERNIA REPAIR    . KNEE ARTHROSCOPY    . TONSILLECTOMY      SOCIAL HISTORY:   Social History  Substance Use Topics  . Smoking status: Never Smoker  . Smokeless tobacco: Never Used  . Alcohol use No    FAMILY HISTORY:   Family History  Problem Relation Age of Onset  . Alcohol abuse Mother   . ALS Father     DRUG ALLERGIES:   Allergies  Allergen Reactions  . Prednisone  Anaphylaxis  . Fentanyl Rash    REVIEW OF SYSTEMS:   Review of Systems  Constitutional: Negative for fever and weight loss.  HENT: Negative for congestion, nosebleeds and tinnitus.   Eyes: Negative for blurred vision, double vision and redness.  Respiratory: Positive for cough, shortness of breath and wheezing. Negative for hemoptysis.   Cardiovascular: Negative for chest pain, orthopnea, leg swelling and PND.  Gastrointestinal: Negative for abdominal pain, diarrhea, melena, nausea and vomiting.  Genitourinary: Negative for dysuria, hematuria and urgency.  Musculoskeletal: Negative for falls and joint pain.  Neurological: Negative for dizziness, tingling, sensory change, focal weakness, seizures, weakness and headaches.  Endo/Heme/Allergies: Negative for polydipsia. Does not bruise/bleed easily.  Psychiatric/Behavioral: Negative for depression and memory loss. The patient is not nervous/anxious.     MEDICATIONS AT HOME:   Prior to Admission medications   Medication Sig Start Date End Date Taking? Authorizing Provider  albuterol (VENTOLIN HFA) 108 (90 BASE) MCG/ACT inhaler Inhale 2 puffs into the lungs every 4 (four) hours as needed for wheezing.    Yes Historical Provider, MD  Ipratropium-Albuterol (COMBIVENT) 20-100 MCG/ACT AERS respimat Inhale 2 puffs into the lungs 4 (four) times daily.   Yes Historical Provider, MD  vardenafil (LEVITRA) 20 MG tablet Take 20 mg by mouth daily as needed for erectile dysfunction.     Historical Provider, MD      VITAL SIGNS:  Blood pressure (!) 155/99, pulse 98, temperature 98.1 F (  36.7 C), temperature source Oral, resp. rate 12, height 5\' 6"  (1.676 m), weight 101.6 kg (224 lb), SpO2 91 %.  PHYSICAL EXAMINATION:  Physical Exam  GENERAL:  50 y.o.-year-old patient lying in the bed in mild resp. Distress.  EYES: Pupils equal, round, reactive to light and accommodation. No scleral icterus. Extraocular muscles intact.  HEENT: Head atraumatic,  normocephalic. Oropharynx and nasopharynx clear. No oropharyngeal erythema, moist oral mucosa  NECK:  Supple, no jugular venous distention. No thyroid enlargement, no tenderness.  LUNGS: Prolonged inspiratory and expiratory phase. And expiratory wheezing bilaterally, no rales, rhonchi. Positive use of accessory muscles. CARDIOVASCULAR: S1, S2 RRR. No murmurs, rubs, gallops, clicks.  ABDOMEN: Soft, nontender, nondistended. Bowel sounds present. No organomegaly or mass.  EXTREMITIES: +1-2 pedal edema b/l, No cyanosis, or clubbing. + 2 pedal & radial pulses b/l.   NEUROLOGIC: Cranial nerves II through XII are intact. No focal Motor or sensory deficits appreciated b/l PSYCHIATRIC: The patient is alert and oriented x 3. Good affect.  SKIN: No obvious rash, lesion, or ulcer.   LABORATORY PANEL:   CBC  Recent Labs Lab 12/06/16 1418  WBC 9.5  HGB 16.6  HCT 50.6  PLT 217   ------------------------------------------------------------------------------------------------------------------  Chemistries   Recent Labs Lab 12/06/16 1413 12/06/16 1418  NA  --  138  K  --  4.0  CL  --  92*  CO2  --  40*  GLUCOSE  --  125*  BUN  --  12  CREATININE  --  0.87  CALCIUM  --  8.9  MG 1.8  --    ------------------------------------------------------------------------------------------------------------------  Cardiac Enzymes No results for input(s): TROPONINI in the last 168 hours. ------------------------------------------------------------------------------------------------------------------  RADIOLOGY:  Ct Abdomen Pelvis W Contrast  Result Date: 12/06/2016 CLINICAL DATA:  Mid abdominal pain. Patient passed out and fell onto a table. EXAM: CT ABDOMEN AND PELVIS WITH CONTRAST TECHNIQUE: Multidetector CT imaging of the abdomen and pelvis was performed using the standard protocol following bolus administration of intravenous contrast. CONTRAST:  ISOVUE-300 IOPAMIDOL (ISOVUE-300)  INJECTION 61% COMPARISON:  None. FINDINGS: Lower chest: Mild dependent bibasilar opacities are favored to represent atelectasis with infiltrate considered less likely. Recommend clinical correlation. Lung bases are otherwise unremarkable. Hepatobiliary: The gallbladder is poorly distended but unremarkable. The liver and portal vein are within normal limits. Pancreas: Unremarkable. No pancreatic ductal dilatation or surrounding inflammatory changes. Spleen: Normal in size without focal abnormality. Adrenals/Urinary Tract: The adrenal glands are normal. The left kidney is normal. Probable tiny cysts in the right kidney, too small to characterize. No renal obstruction. Stomach/Bowel: The stomach and small bowel are normal. Colonic diverticuli are seen without diverticulitis. The appendix is normal. Vascular/Lymphatic: No significant vascular findings are present. No enlarged abdominal or pelvic lymph nodes. Reproductive: Prostate is unremarkable. Other: No abdominal wall hernia or abnormality. No abdominopelvic ascites. Musculoskeletal: Mild anterior wedging of T10 and T11 is favored to be nonacute. No acute bony abnormalities are identified. IMPRESSION: 1. Mild dependent bibasilar opacities favored represent atelectasis rather than infiltrate. Recommend clinical correlation. 2. Mild anterior wedging of T10 and T11 is favored to be nonacute. Recommend clinical correlation. 3. No other acute abnormalities. Electronically Signed   By: Gerome Sam III M.D   On: 12/06/2016 17:08   Dg Chest Port 1 View  Result Date: 12/06/2016 CLINICAL DATA:  Shortness of breath for 2 days. EXAM: PORTABLE CHEST 1 VIEW COMPARISON:  03/27/2016 FINDINGS: Mild enlargement of the cardiopericardial silhouette indistinct density at the left lung base appears  stable and probably from scarring along the lingula. The lungs appear otherwise clear. IMPRESSION: 1. Mild enlargement of the cardiopericardial silhouette, without edema. 2. Bandlike  density at the left lung base, similar to prior, probably from lingular scarring. Electronically Signed   By: Gaylyn RongWalter  Liebkemann M.D.   On: 12/06/2016 15:03     IMPRESSION AND PLAN:   50 year old male with past medical history of asthma/COPD who presents to the hospital due to shortness of breath.  1. Acute respiratory failure with hypoxia-this is secondary to COPD/asthma exacerbation. -I will treat the patient with IV steroids, scheduled DuoNeb's, Pulmicort nebs. -Continue O2 supplementation as tolerated. Follow clinically. Assess for home oxygen prior to discharge.  2. Asthma/COPD exacerbation-secondary to bronchitis and change in weather. -Place patient on IV steroids, scheduled DuoNeb's, Pulmicort nebs. Chest x-ray negative for any acute pathology. Empirically based on Zithromax orally. -Assessed for home oxygen prior to discharge.    All the records are reviewed and case discussed with ED provider. Management plans discussed with the patient, family and they are in agreement.  CODE STATUS: Full  TOTAL TIME TAKING CARE OF THIS PATIENT: 45 minutes.    [place] SirenSAINANI,Riyah Bardon J M.D on 12/06/2016 at 6:16 PM  Between 7am to 6pm - Pager - [phone]  After 6pm go to [email or web address] - password [email or web address] [email or web address]  [facility]  Office  [phone]  CC: Primary care physician; WHITE, [name], NP

## 2016-12-06 NOTE — ED Notes (Signed)
Attempted to call report, nurse unavailable at this time.

## 2016-12-07 LAB — CBC
HCT: 52.5 % — ABNORMAL HIGH (ref 40.0–52.0)
Hemoglobin: 16.9 g/dL (ref 13.0–18.0)
MCH: 28.5 pg (ref 26.0–34.0)
MCHC: 32.2 g/dL (ref 32.0–36.0)
MCV: 88.5 fL (ref 80.0–100.0)
Platelets: 261 10*3/uL (ref 150–440)
RBC: 5.93 MIL/uL — ABNORMAL HIGH (ref 4.40–5.90)
RDW: 15.1 % — ABNORMAL HIGH (ref 11.5–14.5)
WBC: 9.3 10*3/uL (ref 3.8–10.6)

## 2016-12-07 LAB — BASIC METABOLIC PANEL
Anion gap: 7 (ref 5–15)
BUN: 17 mg/dL (ref 6–20)
CO2: 35 mmol/L — ABNORMAL HIGH (ref 22–32)
Calcium: 9.2 mg/dL (ref 8.9–10.3)
Chloride: 98 mmol/L — ABNORMAL LOW (ref 101–111)
Creatinine, Ser: 0.98 mg/dL (ref 0.61–1.24)
GFR calc Af Amer: >60 mL/min (ref >60)
GFR calc non Af Amer: >60 mL/min (ref >60)
Glucose, Bld: 202 mg/dL — ABNORMAL HIGH (ref 65–99)
Potassium: 5 mmol/L (ref 3.5–5.1)
Sodium: 140 mmol/L (ref 135–145)

## 2016-12-07 MED ORDER — METHYLPREDNISOLONE SODIUM SUCC 40 MG IJ SOLR
40.0000 mg | Freq: Two times a day (BID) | INTRAMUSCULAR | Status: DC
  Administered 2016-12-07 – 2016-12-08 (×2): 40 mg via INTRAVENOUS
  Filled 2016-12-07 (×2): qty 1

## 2016-12-07 NOTE — Progress Notes (Signed)
SATURATION QUALIFICATIONS: (This note is used to comply with regulatory documentation for home oxygen)  Patient Saturations on Room Air at Rest =85*%  Patient Saturations on Room Air while Ambulating = 80%  Patient Saturations on 2 Liters of oxygen while Ambulating =92*%  Please briefly explain why patient needs home oxygen: Desats with activity, Asthma. COPD, At rest O2 Sats less than 92%.

## 2016-12-07 NOTE — Progress Notes (Signed)
[facility] - [place] [facility]   PATIENT NAME: Eric Lyons    MR#:  [id number]  DATE OF BIRTH:  1966/05/24  SUBJECTIVE:   Patient here due to acute respiratory failure with hypoxia secondary to asthma/COPD exacerbation. Feels a bit better today. Ambulated and did desaturate and qualified for home oxygen.  REVIEW OF SYSTEMS:    Review of Systems  Constitutional: Negative for chills and fever.  HENT: Negative for congestion and tinnitus.   Eyes: Negative for blurred vision and double vision.  Respiratory: Positive for cough, shortness of breath and wheezing.   Cardiovascular: Negative for chest pain, orthopnea and PND.  Gastrointestinal: Negative for abdominal pain, diarrhea, nausea and vomiting.  Genitourinary: Negative for dysuria and hematuria.  Neurological: Negative for dizziness, sensory change and focal weakness.  All other systems reviewed and are negative.   Nutrition: Regular Tolerating Diet: Yes Tolerating PT: Ambulatory   DRUG ALLERGIES:   Allergies  Allergen Reactions  . Prednisone Anaphylaxis  . Fentanyl Rash    VITALS:  Blood pressure (!) 152/75, pulse (!) 104, temperature 97.5 F (36.4 C), temperature source Oral, resp. rate 20, height 5\' 6"  (1.676 m), weight 101.6 kg (224 lb), SpO2 93 %.  PHYSICAL EXAMINATION:   Physical Exam  GENERAL:  50 y.o.-year-old obese patient sitting up in chair in NAD.  EYES: Pupils equal, round, reactive to light and accommodation. No scleral icterus. Extraocular muscles intact.  HEENT: Head atraumatic, normocephalic. Oropharynx and nasopharynx clear.  NECK:  Supple, no jugular venous distention. No thyroid enlargement, no tenderness.  LUNGS: Good a/e b/l, end-exp. Wheezing b/l, No rales, rhonchi. No use of accessory muscles of respiration.  CARDIOVASCULAR: S1, S2 normal. No murmurs, rubs, or gallops.  ABDOMEN: Soft, nontender, nondistended. Bowel sounds present. No organomegaly or mass.   EXTREMITIES: No cyanosis, clubbing or edema b/l.    NEUROLOGIC: Cranial nerves II through XII are intact. No focal Motor or sensory deficits b/l.   PSYCHIATRIC: The patient is alert and oriented x 3.  SKIN: No obvious rash, lesion, or ulcer.    LABORATORY PANEL:   CBC  Recent Labs Lab 12/07/16 0433  WBC 9.3  HGB 16.9  HCT 52.5*  PLT 261   ------------------------------------------------------------------------------------------------------------------  Chemistries   Recent Labs Lab 12/06/16 1413  12/07/16 0433  NA  --   < > 140  K  --   < > 5.0  CL  --   < > 98*  CO2  --   < > 35*  GLUCOSE  --   < > 202*  BUN  --   < > 17  CREATININE  --   < > 0.98  CALCIUM  --   < > 9.2  MG 1.8  --   --   < > = values in this interval not displayed. ------------------------------------------------------------------------------------------------------------------  Cardiac Enzymes No results for input(s): TROPONINI in the last 168 hours. ------------------------------------------------------------------------------------------------------------------  RADIOLOGY:  Ct Abdomen Pelvis W Contrast  Result Date: 12/06/2016 CLINICAL DATA:  Mid abdominal pain. Patient passed out and fell onto a table. EXAM: CT ABDOMEN AND PELVIS WITH CONTRAST TECHNIQUE: Multidetector CT imaging of the abdomen and pelvis was performed using the standard protocol following bolus administration of intravenous contrast. CONTRAST:  100mL ISOVUE-300 IOPAMIDOL (ISOVUE-300) INJECTION 61% COMPARISON:  None. FINDINGS: Lower chest: Mild dependent bibasilar opacities are favored to represent atelectasis with infiltrate considered less likely. Recommend clinical correlation. Lung bases are otherwise unremarkable. Hepatobiliary: The gallbladder is poorly distended but unremarkable. The  liver and portal vein are within normal limits. Pancreas: Unremarkable. No pancreatic ductal dilatation or surrounding inflammatory changes.  Spleen: Normal in size without focal abnormality. Adrenals/Urinary Tract: The adrenal glands are normal. The left kidney is normal. Probable tiny cysts in the right kidney, too small to characterize. No renal obstruction. Stomach/Bowel: The stomach and small bowel are normal. Colonic diverticuli are seen without diverticulitis. The appendix is normal. Vascular/Lymphatic: No significant vascular findings are present. No enlarged abdominal or pelvic lymph nodes. Reproductive: Prostate is unremarkable. Other: No abdominal wall hernia or abnormality. No abdominopelvic ascites. Musculoskeletal: Mild anterior wedging of T10 and T11 is favored to be nonacute. No acute bony abnormalities are identified. IMPRESSION: 1. Mild dependent bibasilar opacities favored represent atelectasis rather than infiltrate. Recommend clinical correlation. 2. Mild anterior wedging of T10 and T11 is favored to be nonacute. Recommend clinical correlation. 3. No other acute abnormalities. Electronically Signed   By: Gerome Samavid  Williams III M.D   On: 12/06/2016 17:08   Dg Chest Port 1 View  Result Date: 12/06/2016 CLINICAL DATA:  Shortness of breath for 2 days. EXAM: PORTABLE CHEST 1 VIEW COMPARISON:  03/27/2016 FINDINGS: Mild enlargement of the cardiopericardial silhouette indistinct density at the left lung base appears stable and probably from scarring along the lingula. The lungs appear otherwise clear. IMPRESSION: 1. Mild enlargement of the cardiopericardial silhouette, without edema. 2. Bandlike density at the left lung base, similar to prior, probably from lingular scarring. Electronically Signed   By: Gaylyn RongWalter  Liebkemann M.D.   On: 12/06/2016 15:03     ASSESSMENT AND PLAN:   50 year old male with past medical history of asthma/COPD who presents to the hospital due to shortness of breath.  1. Acute respiratory failure with hypoxia-this is secondary to COPD/asthma exacerbation. - cont. IV Steroids but will taper as he feels better.  Cont. scheduled DuoNeb's, Pulmicort nebs. -Continue O2 supplementation as tolerated.   2. Asthma/COPD exacerbation-secondary to bronchitis and change in weather. - cont. IV steroids but will taper, cont. scheduled DuoNeb's, Pulmicort nebs. Chest x-ray negative for any acute pathology. Cont. Zithromax.  - qualified for home O2 and will arrange prior to discharge.   3. Hyperglycemia - steroid mediated and will monitor.    All the records are reviewed and case discussed with Care Management/Social Worker. Management plans discussed with the patient, family and they are in agreement.  CODE STATUS: Full code  DVT Prophylaxis: Lovenox  TOTAL TIME TAKING CARE OF THIS PATIENT: 25 minutes.   POSSIBLE D/C IN 1-2 DAYS, DEPENDING ON CLINICAL CONDITION.   [place] SirenSAINANI,Leonela Kivi J M.D on 12/07/2016 at 1:02 PM  Between 7am to 6pm - Pager - [phone]  After 6pm go to [email or web address] - Scientist, research (life [email or web address])password [email or web address]  [facility] [place] [facility]  Office  [phone]  CC: Primary care physician; WHITE, [name], NP

## 2016-12-08 MED ORDER — ALUM & MAG HYDROXIDE-SIMETH 200-200-20 MG/5ML PO SUSP
30.0000 mL | Freq: Four times a day (QID) | ORAL | Status: DC | PRN
  Administered 2016-12-08: 30 mL via ORAL
  Filled 2016-12-08: qty 30

## 2016-12-08 MED ORDER — MOMETASONE FURO-FORMOTEROL FUM 200-5 MCG/ACT IN AERO
2.0000 | INHALATION_SPRAY | Freq: Two times a day (BID) | RESPIRATORY_TRACT | Status: DC
  Filled 2016-12-08: qty 8.8

## 2016-12-08 MED ORDER — IPRATROPIUM-ALBUTEROL 20-100 MCG/ACT IN AERS: 2.0000 | INHALATION_SPRAY | Freq: Four times a day (QID) | RESPIRATORY_TRACT | 2 refills | Status: DC

## 2016-12-08 MED ORDER — IPRATROPIUM-ALBUTEROL 0.5-2.5 (3) MG/3ML IN SOLN: 3.0000 mL | Freq: Four times a day (QID) | RESPIRATORY_TRACT | 1 refills | Status: DC | PRN

## 2016-12-08 MED ORDER — FLUTICASONE-SALMETEROL 250-50 MCG/DOSE IN AEPB: 1.0000 | INHALATION_SPRAY | Freq: Two times a day (BID) | RESPIRATORY_TRACT | 1 refills | Status: DC

## 2016-12-08 MED ORDER — DEXAMETHASONE 4 MG PO TABS: ORAL_TABLET | ORAL | 0 refills | Status: DC

## 2016-12-08 MED ORDER — ALBUTEROL SULFATE HFA 108 (90 BASE) MCG/ACT IN AERS: 2.0000 | INHALATION_SPRAY | Freq: Four times a day (QID) | RESPIRATORY_TRACT | 2 refills | Status: DC | PRN

## 2016-12-08 MED ORDER — AZITHROMYCIN 250 MG PO TABS: 250.0000 mg | ORAL_TABLET | Freq: Every day | ORAL | 0 refills | Status: AC

## 2016-12-08 NOTE — Progress Notes (Signed)
Discharge instructions reviewed with patient. Oxygen tank, nebulizer machine taken home with pt and prescriptions given to pt. Pt verbalizes discharge instructions. Pt understands he needs to make an appointment with hisi PCP.

## 2016-12-08 NOTE — Discharge Summary (Signed)
[facility] [place] [facility]   PATIENT NAME: Eric Lyons    MR#:  [id number]  DATE OF BIRTH:  Sep 22, 1966  DATE OF ADMISSION:  12/06/2016 ADMITTING PHYSICIAN: Eric SirenVivek J Mickel Schreur, MD  DATE OF DISCHARGE: 12/08/2016  PRIMARY CARE PHYSICIAN: [name], [name], NP    ADMISSION DIAGNOSIS:  Moderate persistent asthma with exacerbation [J45.41]  DISCHARGE DIAGNOSIS:  Active Problems:   Asthma exacerbation   SECONDARY DIAGNOSIS:   Past Medical History:  Diagnosis Date  . Asthma   . COPD (chronic obstructive pulmonary disease) [facility](HCC)     HOSPITAL COURSE:   50 year old male with past medical history of asthma/COPD who presents to the hospital due to shortness of breath.  1. Acute respiratory failure with hypoxia-this was secondary to COPD/asthma exacerbation. -Patient was treated aggressively with high-dose IV steroids, scheduled DuoNeb's, Pulmicort nebs and empiric Zithromax. -After aggressive therapy is wheezing and bronchospasm has improved. He is still hypoxic on ambulation and therefore home oxygen is being arranged for him prior to discharge. -He is currently being discharged on a long Decadron taper, Combivent, albuterol inhaler, Advair, DuoNeb nebs as needed.  2. Asthma/COPD exacerbation-secondary to bronchitis and change in weather. - Chest x-ray negative for any acute pathology. Patient was treated aggressively as mentioned above with high-dose IV steroids, scheduled DuoNeb's, Pulmicort nebs and O2 supplementation. -He has significantly improved and now being discharged on oral Decadron taper as he is allergic to prednisone, Combivent, albuterol inhaler as needed, Advair, DuoNeb nebs as needed.  -He didn't qualify for home oxygen which is being arranged for him prior to discharge.  3. Hyperglycemia - steroid mediated and can be further followed by his primary care physician as an outpatient.  DISCHARGE CONDITIONS:   Stable  CONSULTS  OBTAINED:    DRUG ALLERGIES:   Allergies  Allergen Reactions  . Prednisone Anaphylaxis  . Fentanyl Rash    DISCHARGE MEDICATIONS:     Medication List    TAKE these medications   azithromycin 250 MG tablet Commonly known as:  ZITHROMAX Take 1 tablet (250 mg total) by mouth daily.   dexamethasone 4 MG tablet Commonly known as:  DECADRON Take 2 tabs Daily X 2 days, then take 1.5 tab daily X 2 days, then take 1 tab daily X 2 days, then 0.5 tab daily X 2 days then STOP   Fluticasone-Salmeterol 250-50 MCG/DOSE Aepb Commonly known as:  ADVAIR DISKUS Inhale 1 puff into the lungs 2 (two) times daily.   Ipratropium-Albuterol 20-100 MCG/ACT Aers respimat Commonly known as:  COMBIVENT Inhale 2 puffs into the lungs 4 (four) times daily. What changed:  Another medication with the same name was added. Make sure you understand how and when to take each.   ipratropium-albuterol 0.5-2.5 (3) MG/3ML Soln Commonly known as:  DUONEB Take 3 mLs by nebulization every 6 (six) hours as needed (wheezing, shortness of breath.). What changed:  You were already taking a medication with the same name, and this prescription was added. Make sure you understand how and when to take each.   vardenafil 20 MG tablet Commonly known as:  LEVITRA Take 20 mg by mouth daily as needed for erectile dysfunction.   VENTOLIN HFA 108 (90 Base) MCG/ACT inhaler Generic drug:  albuterol Inhale 2 puffs into the lungs every 4 (four) hours as needed for wheezing. What changed:  Another medication with the same name was added. Make sure you understand how and when to take each.   albuterol 108 (90 Base) MCG/ACT  inhaler Commonly known as:  PROVENTIL HFA;VENTOLIN HFA Inhale 2 puffs into the lungs every 6 (six) hours as needed for wheezing or shortness of breath. What changed:  You were already taking a medication with the same name, and this prescription was added. Make sure you understand how and when to take each.             Durable Medical Equipment        Start     Ordered   12/08/16 [id number]  For home use only DME oxygen  Once    Question Answer Comment  Mode or (Route) Nasal cannula   Liters per Minute 2   Frequency Continuous (stationary and portable oxygen unit needed)   Oxygen conserving device Yes   Oxygen delivery system Gas      12/08/16 0852   12/08/16 0852  For home use only DME Nebulizer machine  Once    Question:  Patient needs a nebulizer to treat with the following condition  Answer:  COPD (chronic obstructive pulmonary disease) (HCC)   12/08/16 0852        DISCHARGE INSTRUCTIONS:   DIET:  Regular diet  DISCHARGE CONDITION:  Stable  ACTIVITY:  Activity as tolerated  OXYGEN:  Home Oxygen: Yes.     Oxygen Delivery: 2 liters/min via Patient connected to nasal cannula oxygen  DISCHARGE LOCATION:  home   If you experience worsening of your admission symptoms, develop shortness of breath, life threatening emergency, suicidal or homicidal thoughts you must seek medical attention immediately by calling 911 or calling your MD immediately  if symptoms less severe.  You Must read complete instructions/literature along with all the possible adverse reactions/side effects for all the Medicines you take and that have been prescribed to you. Take any new Medicines after you have completely understood and accpet all the possible adverse reactions/side effects.   Please note  You were cared for by a hospitalist during your hospital stay. If you have any questions about your discharge medications or the care you received while you were in the hospital after you are discharged, you can call the unit and asked to speak with the hospitalist on call if the hospitalist that took care of you is not available. Once you are discharged, your primary care physician will handle any further medical issues. Please note that NO REFILLS for any discharge medications will be authorized once you are  discharged, as it is imperative that you return to your primary care physician (or establish a relationship with a primary care physician if you do not have one) for your aftercare needs so that they can reassess your need for medications and monitor your lab values.     Today   Shortness of breath much improved since admission. Still has a cough but non-productive.  Complaining of a headache.   VITAL SIGNS:  Blood pressure (!) 148/78, pulse 99, temperature 98.7 F (37.1 C), temperature source Axillary, resp. rate 20, height 5\' 6"  (1.676 m), weight 101.6 kg (224 lb), SpO2 93 %.  I/O:   Intake/Output Summary (Last 24 hours) at 12/08/16 1211 Last data filed at 12/08/16 1026  Gross per 24 hour  Intake              840 ml  Output                0 ml  Net              840 ml  PHYSICAL EXAMINATION:  GENERAL:  50 y.o.-year-old obese patient sitting up in chair in no acute distress.  EYES: Pupils equal, round, reactive to light and accommodation. No scleral icterus. Extraocular muscles intact.  HEENT: Head atraumatic, normocephalic. Oropharynx and nasopharynx clear.  NECK:  Supple, no jugular venous distention. No thyroid enlargement, no tenderness.  LUNGS: good a/e b/l, no wheezing, rales,rhonchi. No use of accessory muscles of respiration.  CARDIOVASCULAR: S1, S2 normal. No murmurs, rubs, or gallops.  ABDOMEN: Soft, non-tender, non-distended. Bowel sounds present. No organomegaly or mass.  EXTREMITIES: No pedal edema, cyanosis, or clubbing.  NEUROLOGIC: Cranial nerves II through XII are intact. No focal motor or sensory defecits b/l.  PSYCHIATRIC: The patient is alert and oriented x 3. Good affect.  SKIN: No obvious rash, lesion, or ulcer.   DATA REVIEW:   CBC  Recent Labs Lab 12/07/16 0433  WBC 9.3  HGB 16.9  HCT 52.5*  PLT 261    Chemistries   Recent Labs Lab 12/06/16 1413  12/07/16 0433  NA  --   < > 140  K  --   < > 5.0  CL  --   < > 98*  CO2  --   < > 35*   GLUCOSE  --   < > 202*  BUN  --   < > 17  CREATININE  --   < > 0.98  CALCIUM  --   < > 9.2  MG 1.8  --   --   < > = values in this interval not displayed.  Cardiac Enzymes No results for input(s): TROPONINI in the last 168 hours.   RADIOLOGY:  Ct Abdomen Pelvis W Contrast  Result Date: 12/06/2016 CLINICAL DATA:  Mid abdominal pain. Patient passed out and fell onto a table. EXAM: CT ABDOMEN AND PELVIS WITH CONTRAST TECHNIQUE: Multidetector CT imaging of the abdomen and pelvis was performed using the standard protocol following bolus administration of intravenous contrast. CONTRAST:  ISOVUE-300 IOPAMIDOL (ISOVUE-300) INJECTION 61% COMPARISON:  None. FINDINGS: Lower chest: Mild dependent bibasilar opacities are favored to represent atelectasis with infiltrate considered less likely. Recommend clinical correlation. Lung bases are otherwise unremarkable. Hepatobiliary: The gallbladder is poorly distended but unremarkable. The liver and portal vein are within normal limits. Pancreas: Unremarkable. No pancreatic ductal dilatation or surrounding inflammatory changes. Spleen: Normal in size without focal abnormality. Adrenals/Urinary Tract: The adrenal glands are normal. The left kidney is normal. Probable tiny cysts in the right kidney, too small to characterize. No renal obstruction. Stomach/Bowel: The stomach and small bowel are normal. Colonic diverticuli are seen without diverticulitis. The appendix is normal. Vascular/Lymphatic: No significant vascular findings are present. No enlarged abdominal or pelvic lymph nodes. Reproductive: Prostate is unremarkable. Other: No abdominal wall hernia or abnormality. No abdominopelvic ascites. Musculoskeletal: Mild anterior wedging of T10 and T11 is favored to be nonacute. No acute bony abnormalities are identified. IMPRESSION: 1. Mild dependent bibasilar opacities favored represent atelectasis rather than infiltrate. Recommend clinical correlation. 2. Mild  anterior wedging of T10 and T11 is favored to be nonacute. Recommend clinical correlation. 3. No other acute abnormalities. Electronically Signed   By: Gerome Sam III M.D   On: 12/06/2016 17:08   Dg Chest Port 1 View  Result Date: 12/06/2016 CLINICAL DATA:  Shortness of breath for 2 days. EXAM: PORTABLE CHEST 1 VIEW COMPARISON:  03/27/2016 FINDINGS: Mild enlargement of the cardiopericardial silhouette indistinct density at the left lung base appears stable and probably from scarring along the lingula. The  lungs appear otherwise clear. IMPRESSION: 1. Mild enlargement of the cardiopericardial silhouette, without edema. 2. Bandlike density at the left lung base, similar to prior, probably from lingular scarring. Electronically Signed   By: Gaylyn Rong M.D.   On: 12/06/2016 15:03      Management plans discussed with the patient, family and they are in agreement.  CODE STATUS:     Code Status Orders        Start     Ordered   12/06/16 2024  Full code  Continuous     12/06/16 2023    Code Status History    Date Active Date Inactive Code Status Order ID Comments User Context   11/13/2015  2:58 PM 11/15/2015  5:46 PM Full Code [id number]  Shaune Pollack, MD Inpatient      TOTAL TIME TAKING CARE OF THIS PATIENT: 40 minutes.    Eric Siren M.D on 12/08/2016 at 12:11 PM  Between 7am to 6pm - Pager - [phone]  After 6pm go to [email or web address] - Scientist, research (life sciences) [facility]  Office  [phone]  CC: Primary care physician; [name], Arlyss Repress, NP

## 2016-12-08 NOTE — Care Management Note (Signed)
Case Management Note  Patient Details  Name: Eric PollackWilliam F Lyons MRN: [id number] Date of Birth: 08-15-1966  Subjective/Objective:        A referral for home health RN, new oxygen, and a home nebulizer was faxed to [facility]. Per Britta MccreedyBarbara at [facility], Mr Scerbo's home address is within the [facility] catchment area for home health and DME equipment.             Action/Plan:   Expected Discharge Date:                  Expected Discharge Plan:     In-House Referral:     Discharge planning Services     Post Acute Care Choice:    Choice offered to:     DME Arranged:    DME Agency:     HH Arranged:    HH Agency:     Status of Service:     If discussed at [employer] Length of Stay Meetings, dates discussed:    Additional Comments:  Desean Heemstra A, RN 12/08/2016, 9:07 AM

## 2016-12-12 DIAGNOSIS — J449 Chronic obstructive pulmonary disease, unspecified: Secondary | ICD-10-CM | POA: Insufficient documentation

## 2016-12-31 ENCOUNTER — Emergency Department: Payer: Medicare Other

## 2016-12-31 ENCOUNTER — Inpatient Hospital Stay
Admission: EM | Admit: 2016-12-31 | Discharge: 2017-01-13 | DRG: 207 | Disposition: A | Discharge status: Home / Self Care | Payer: Medicare Other | Attending: Internal Medicine | Admitting: Internal Medicine

## 2016-12-31 ENCOUNTER — Encounter: Payer: Self-pay | Admitting: Emergency Medicine

## 2016-12-31 ENCOUNTER — Inpatient Hospital Stay: Payer: Medicare Other

## 2016-12-31 DIAGNOSIS — J9621 Acute and chronic respiratory failure with hypoxia: Secondary | ICD-10-CM | POA: Diagnosis present

## 2016-12-31 DIAGNOSIS — S0101XA Laceration without foreign body of scalp, initial encounter: Secondary | ICD-10-CM | POA: Diagnosis present

## 2016-12-31 DIAGNOSIS — J80 Acute respiratory distress syndrome: Secondary | ICD-10-CM | POA: Diagnosis present

## 2016-12-31 DIAGNOSIS — Y92410 Unspecified street and highway as the place of occurrence of the external cause: Secondary | ICD-10-CM

## 2016-12-31 DIAGNOSIS — J441 Chronic obstructive pulmonary disease with (acute) exacerbation: Secondary | ICD-10-CM

## 2016-12-31 DIAGNOSIS — R140 Abdominal distension (gaseous): Secondary | ICD-10-CM

## 2016-12-31 DIAGNOSIS — S2242XA Multiple fractures of ribs, left side, initial encounter for closed fracture: Secondary | ICD-10-CM | POA: Diagnosis present

## 2016-12-31 DIAGNOSIS — Z888 Allergy status to other drugs, medicaments and biological substances status: Secondary | ICD-10-CM

## 2016-12-31 DIAGNOSIS — E662 Morbid (severe) obesity with alveolar hypoventilation: Secondary | ICD-10-CM | POA: Diagnosis present

## 2016-12-31 DIAGNOSIS — J449 Chronic obstructive pulmonary disease, unspecified: Secondary | ICD-10-CM | POA: Diagnosis not present

## 2016-12-31 DIAGNOSIS — Z9981 Dependence on supplemental oxygen: Secondary | ICD-10-CM | POA: Diagnosis not present

## 2016-12-31 DIAGNOSIS — G4733 Obstructive sleep apnea (adult) (pediatric): Secondary | ICD-10-CM | POA: Diagnosis not present

## 2016-12-31 DIAGNOSIS — S271XXA Traumatic hemothorax, initial encounter: Secondary | ICD-10-CM | POA: Diagnosis present

## 2016-12-31 DIAGNOSIS — J440 Chronic obstructive pulmonary disease with acute lower respiratory infection: Secondary | ICD-10-CM | POA: Diagnosis present

## 2016-12-31 DIAGNOSIS — J9602 Acute respiratory failure with hypercapnia: Secondary | ICD-10-CM

## 2016-12-31 DIAGNOSIS — M25539 Pain in unspecified wrist: Secondary | ICD-10-CM

## 2016-12-31 DIAGNOSIS — S27329A Contusion of lung, unspecified, initial encounter: Secondary | ICD-10-CM | POA: Diagnosis present

## 2016-12-31 DIAGNOSIS — J189 Pneumonia, unspecified organism: Secondary | ICD-10-CM | POA: Diagnosis present

## 2016-12-31 DIAGNOSIS — Z79899 Other long term (current) drug therapy: Secondary | ICD-10-CM

## 2016-12-31 DIAGNOSIS — J9601 Acute respiratory failure with hypoxia: Secondary | ICD-10-CM

## 2016-12-31 DIAGNOSIS — J9622 Acute and chronic respiratory failure with hypercapnia: Secondary | ICD-10-CM | POA: Diagnosis present

## 2016-12-31 DIAGNOSIS — J90 Pleural effusion, not elsewhere classified: Secondary | ICD-10-CM | POA: Diagnosis present

## 2016-12-31 DIAGNOSIS — M6281 Muscle weakness (generalized): Secondary | ICD-10-CM

## 2016-12-31 DIAGNOSIS — S81012A Laceration without foreign body, left knee, initial encounter: Secondary | ICD-10-CM | POA: Diagnosis present

## 2016-12-31 DIAGNOSIS — R0600 Dyspnea, unspecified: Secondary | ICD-10-CM

## 2016-12-31 DIAGNOSIS — S299XXD Unspecified injury of thorax, subsequent encounter: Secondary | ICD-10-CM | POA: Diagnosis not present

## 2016-12-31 DIAGNOSIS — J810 Acute pulmonary edema: Secondary | ICD-10-CM

## 2016-12-31 DIAGNOSIS — Z22322 Carrier or suspected carrier of Methicillin resistant Staphylococcus aureus: Secondary | ICD-10-CM | POA: Diagnosis not present

## 2016-12-31 DIAGNOSIS — J9811 Atelectasis: Secondary | ICD-10-CM | POA: Diagnosis present

## 2016-12-31 DIAGNOSIS — J9690 Respiratory failure, unspecified, unspecified whether with hypoxia or hypercapnia: Secondary | ICD-10-CM

## 2016-12-31 DIAGNOSIS — E871 Hypo-osmolality and hyponatremia: Secondary | ICD-10-CM | POA: Diagnosis present

## 2016-12-31 DIAGNOSIS — Z4659 Encounter for fitting and adjustment of other gastrointestinal appliance and device: Secondary | ICD-10-CM

## 2016-12-31 DIAGNOSIS — Z6838 Body mass index (BMI) 38.0-38.9, adult: Secondary | ICD-10-CM | POA: Diagnosis not present

## 2016-12-31 DIAGNOSIS — J9600 Acute respiratory failure, unspecified whether with hypoxia or hypercapnia: Secondary | ICD-10-CM | POA: Diagnosis present

## 2016-12-31 LAB — PROTIME-INR
INR: 0.96
Prothrombin Time: 12.8 seconds (ref 11.4–15.2)

## 2016-12-31 LAB — CBC
HCT: 44.9 % (ref 40.0–52.0)
Hemoglobin: 14.9 g/dL (ref 13.0–18.0)
MCH: 28.3 pg (ref 26.0–34.0)
MCHC: 33.2 g/dL (ref 32.0–36.0)
MCV: 85.3 fL (ref 80.0–100.0)
Platelets: 221 10*3/uL (ref 150–440)
RBC: 5.27 MIL/uL (ref 4.40–5.90)
RDW: 15.6 % — ABNORMAL HIGH (ref 11.5–14.5)
WBC: 8.8 10*3/uL (ref 3.8–10.6)

## 2016-12-31 LAB — PROCALCITONIN: Procalcitonin: 0.11 ng/mL

## 2016-12-31 LAB — BLOOD GAS, ARTERIAL
Acid-Base Excess: 7 mmol/L — ABNORMAL HIGH (ref 0.0–2.0)
Allens test (pass/fail): POSITIVE — AB
Bicarbonate: 37.7 mmol/L — ABNORMAL HIGH (ref 20.0–28.0)
FIO2: 0.7
MECHVT: 500 mL
O2 Saturation: 95 %
PEEP: 5 cmH2O
Patient temperature: 37
RATE: 20 resp/min
pCO2 arterial: 84 mmHg (ref 32.0–48.0)
pH, Arterial: 7.26 — ABNORMAL LOW (ref 7.350–7.450)
pO2, Arterial: 86 mmHg (ref 83.0–108.0)

## 2016-12-31 LAB — COMPREHENSIVE METABOLIC PANEL
ALT: 38 U/L (ref 17–63)
AST: 30 U/L (ref 15–41)
Albumin: 3.4 g/dL — ABNORMAL LOW (ref 3.5–5.0)
Alkaline Phosphatase: 51 U/L (ref 38–126)
Anion gap: 8 (ref 5–15)
BUN: 10 mg/dL (ref 6–20)
CO2: 37 mmol/L — ABNORMAL HIGH (ref 22–32)
Calcium: 8.6 mg/dL — ABNORMAL LOW (ref 8.9–10.3)
Chloride: 89 mmol/L — ABNORMAL LOW (ref 101–111)
Creatinine, Ser: 0.59 mg/dL — ABNORMAL LOW (ref 0.61–1.24)
GFR calc Af Amer: >60 mL/min (ref >60)
GFR calc non Af Amer: >60 mL/min (ref >60)
Glucose, Bld: 148 mg/dL — ABNORMAL HIGH (ref 65–99)
Potassium: 4.4 mmol/L (ref 3.5–5.1)
Sodium: 134 mmol/L — ABNORMAL LOW (ref 135–145)
Total Bilirubin: 0.6 mg/dL (ref 0.3–1.2)
Total Protein: 7.3 g/dL (ref 6.5–8.1)

## 2016-12-31 LAB — TRIGLYCERIDES: Triglycerides: 635 mg/dL — ABNORMAL HIGH (ref <150)

## 2016-12-31 LAB — TROPONIN I
Troponin I: <0.03 ng/mL (ref <0.03)
Troponin I: <0.03 ng/mL (ref <0.03)

## 2016-12-31 LAB — LACTIC ACID, PLASMA
Lactic Acid, Venous: 1 mmol/L (ref 0.5–1.9)
Lactic Acid, Venous: 1.5 mmol/L (ref 0.5–1.9)

## 2016-12-31 MED ORDER — SODIUM CHLORIDE 0.9 % IV SOLN
1.0000 mg/h | INTRAVENOUS | Status: DC
  Administered 2016-12-31: 1 mg/h via INTRAVENOUS
  Filled 2016-12-31: qty 10

## 2016-12-31 MED ORDER — FAMOTIDINE IN NACL 20-0.9 MG/50ML-% IV SOLN
20.0000 mg | Freq: Two times a day (BID) | INTRAVENOUS | Status: DC
  Administered 2016-12-31 – 2017-01-05 (×11): 20 mg via INTRAVENOUS
  Filled 2016-12-31 (×11): qty 50

## 2016-12-31 MED ORDER — LORAZEPAM 2 MG/ML IJ SOLN
0.5000 mg | Freq: Once | INTRAMUSCULAR | Status: DC
  Filled 2016-12-31: qty 0.25

## 2016-12-31 MED ORDER — LORAZEPAM 2 MG/ML IJ SOLN
INTRAMUSCULAR | Status: AC
  Filled 2016-12-31: qty 1

## 2016-12-31 MED ORDER — IPRATROPIUM-ALBUTEROL 0.5-2.5 (3) MG/3ML IN SOLN
3.0000 mL | Freq: Once | RESPIRATORY_TRACT | Status: AC
  Administered 2016-12-31: 3 mL via RESPIRATORY_TRACT
  Filled 2016-12-31: qty 3

## 2016-12-31 MED ORDER — ONDANSETRON HCL 4 MG/2ML IJ SOLN
4.0000 mg | Freq: Once | INTRAMUSCULAR | Status: AC
  Administered 2016-12-31: 4 mg via INTRAVENOUS

## 2016-12-31 MED ORDER — MIDAZOLAM HCL 5 MG/5ML IJ SOLN
2.0000 mg | Freq: Once | INTRAMUSCULAR | Status: AC
  Administered 2016-12-31: 2 mg via INTRAVENOUS

## 2016-12-31 MED ORDER — MORPHINE SULFATE (PF) 4 MG/ML IV SOLN
4.0000 mg | Freq: Once | INTRAVENOUS | Status: AC
  Administered 2016-12-31: 4 mg via INTRAVENOUS

## 2016-12-31 MED ORDER — PROPOFOL 1000 MG/100ML IV EMUL
0.0000 ug/kg/min | INTRAVENOUS | Status: DC
Start: 2016-12-31 — End: 2017-01-01
  Administered 2016-12-31: 35.797 ug/kg/min via INTRAVENOUS
  Administered 2017-01-01: 36.695 ug/kg/min via INTRAVENOUS
  Filled 2016-12-31 (×2): qty 100

## 2016-12-31 MED ORDER — ONDANSETRON HCL 4 MG/2ML IJ SOLN
INTRAMUSCULAR | Status: AC
  Administered 2016-12-31: 4 mg via INTRAVENOUS
  Filled 2016-12-31: qty 2

## 2016-12-31 MED ORDER — ALBUTEROL SULFATE (2.5 MG/3ML) 0.083% IN NEBU
2.5000 mg | INHALATION_SOLUTION | RESPIRATORY_TRACT | Status: DC
  Administered 2016-12-31 – 2017-01-01 (×4): 2.5 mg via RESPIRATORY_TRACT
  Filled 2016-12-31 (×3): qty 3

## 2016-12-31 MED ORDER — HEPARIN SODIUM (PORCINE) 5000 UNIT/ML IJ SOLN
5000.0000 [IU] | Freq: Three times a day (TID) | INTRAMUSCULAR | Status: DC
  Administered 2016-12-31 – 2017-01-01 (×3): 5000 [IU] via SUBCUTANEOUS
  Filled 2016-12-31 (×3): qty 1

## 2016-12-31 MED ORDER — SODIUM CHLORIDE 0.9 % IV SOLN
INTRAVENOUS | Status: DC
  Administered 2016-12-31: 1000 mL via INTRAVENOUS

## 2016-12-31 MED ORDER — HYDROCORTISONE NA SUCCINATE PF 100 MG IJ SOLR
50.0000 mg | Freq: Four times a day (QID) | INTRAMUSCULAR | Status: DC
  Administered 2016-12-31 – 2017-01-02 (×7): 50 mg via INTRAVENOUS
  Filled 2016-12-31 (×7): qty 2

## 2016-12-31 MED ORDER — PROPOFOL 1000 MG/100ML IV EMUL
5.0000 ug/kg/min | Freq: Once | INTRAVENOUS | Status: AC
  Administered 2016-12-31: 10 ug/kg/min via INTRAVENOUS
  Filled 2016-12-31: qty 100

## 2016-12-31 MED ORDER — IOPAMIDOL (ISOVUE-300) INJECTION 61%
100.0000 mL | Freq: Once | INTRAVENOUS | Status: AC | PRN
  Administered 2016-12-31: 100 mL via INTRAVENOUS
  Filled 2016-12-31: qty 100

## 2016-12-31 MED ORDER — SODIUM CHLORIDE 0.9 % IV SOLN: 250.0000 mL | INTRAVENOUS | Status: DC | PRN

## 2016-12-31 MED ORDER — IPRATROPIUM BROMIDE 0.02 % IN SOLN
0.5000 mg | Freq: Four times a day (QID) | RESPIRATORY_TRACT | Status: DC
  Administered 2016-12-31 – 2017-01-01 (×3): 0.5 mg via RESPIRATORY_TRACT
  Filled 2016-12-31 (×3): qty 2.5

## 2016-12-31 MED ORDER — MORPHINE SULFATE (PF) 4 MG/ML IV SOLN
INTRAVENOUS | Status: AC
  Administered 2016-12-31: 4 mg via INTRAVENOUS
  Filled 2016-12-31: qty 1

## 2016-12-31 MED ORDER — IPRATROPIUM-ALBUTEROL 0.5-2.5 (3) MG/3ML IN SOLN
RESPIRATORY_TRACT | Status: AC
  Filled 2016-12-31: qty 3

## 2016-12-31 MED ORDER — LORAZEPAM 2 MG/ML IJ SOLN
1.0000 mg | Freq: Once | INTRAMUSCULAR | Status: AC
  Administered 2016-12-31: 1 mg via INTRAVENOUS

## 2016-12-31 MED ORDER — SODIUM CHLORIDE 0.9 % IV BOLUS (SEPSIS)
1000.0000 mL | Freq: Once | INTRAVENOUS | Status: AC
  Administered 2016-12-31: 1000 mL via INTRAVENOUS

## 2016-12-31 NOTE — ED Notes (Signed)
Patient suctioned.

## 2016-12-31 NOTE — ED Notes (Signed)
Patient taken up to ICU20 by Clint LippsLea, RN; Mayra, EDT; and RT.

## 2016-12-31 NOTE — ED Notes (Signed)
Spoke with Trey PaulaJeff from [facility], he will be here to give pt nebulizers tx in a few minutes.
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
## 2016-12-31 NOTE — ED Notes (Signed)
RT at bedside to administer nebulizer tx.

## 2016-12-31 NOTE — ED Provider Notes (Signed)
Patient is beginning to bite on the tube a little bit. We'll try a Versed drip.   Eric Lyons F MalArnaldo Natalinda, MD 12/31/16 [phone]
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
## 2016-12-31 NOTE — ED Notes (Signed)
RT called to give nebulizer tx.

## 2016-12-31 NOTE — ED Notes (Signed)
1550 - pt returned from xray to room 37 because of sob. Pt on 4nc. Pt placed on monitor, o2sats 88%. Dr Lenard Lancepaduchowski notified. Order tp place pt on NRB. Done. 10L o2 sats 93%.  1600 - pt o2 sats 88%. NRB o2 increased to 15L. O2 sats 92%. md notified. Ordered to give 1 mg iv ativan and 1 duoneb. Walked to main to get ativan and duoneb. Returned to room to find CT tech had taken pt to CT. Went to ct to stay with pt. Pt asked multiple times if he was OK and if he was getting enough air. Pt answered yes each time. When pt removed from ct table he was dusky around face, hard to wake up. o2 increased to 25L and pt was quickly returned to room and MD notified.   1615 - respiratory called for bipap and to bedside. o2 sats 88% on bipap. MD decided to intubate. Code cart, glide scope, and staff to bedside.  1625 - 20mg  etomidate, 120mg  succinylcholine given iv.  1627 - 7.5 ETT placed 24 cm at lip. Positive color change. sats 95%. Pt transferred to ed04. Report given.

## 2017-01-01 ENCOUNTER — Inpatient Hospital Stay: Payer: Medicare Other

## 2017-01-01 LAB — BLOOD GAS, ARTERIAL
Acid-Base Excess: 12.9 mmol/L — ABNORMAL HIGH (ref 0.0–2.0)
Bicarbonate: 38.7 mmol/L — ABNORMAL HIGH (ref 20.0–28.0)
FIO2: 0.7
MECHVT: 500 mL
Mechanical Rate: 24
O2 Saturation: 98 %
PEEP: 5 cmH2O
Patient temperature: 37
pCO2 arterial: 52 mmHg — ABNORMAL HIGH (ref 32.0–48.0)
pH, Arterial: 7.48 — ABNORMAL HIGH (ref 7.350–7.450)
pO2, Arterial: 97 mmHg (ref 83.0–108.0)

## 2017-01-01 LAB — CBC
HCT: 42.9 % (ref 40.0–52.0)
Hemoglobin: 14.1 g/dL (ref 13.0–18.0)
MCH: 28.1 pg (ref 26.0–34.0)
MCHC: 32.9 g/dL (ref 32.0–36.0)
MCV: 85.4 fL (ref 80.0–100.0)
Platelets: 204 10*3/uL (ref 150–440)
RBC: 5.03 MIL/uL (ref 4.40–5.90)
RDW: 15.8 % — ABNORMAL HIGH (ref 11.5–14.5)
WBC: 7.5 10*3/uL (ref 3.8–10.6)

## 2017-01-01 LAB — BASIC METABOLIC PANEL
Anion gap: 6 (ref 5–15)
BUN: 10 mg/dL (ref 6–20)
CO2: 34 mmol/L — ABNORMAL HIGH (ref 22–32)
Calcium: 8.2 mg/dL — ABNORMAL LOW (ref 8.9–10.3)
Chloride: 95 mmol/L — ABNORMAL LOW (ref 101–111)
Creatinine, Ser: 0.58 mg/dL — ABNORMAL LOW (ref 0.61–1.24)
GFR calc Af Amer: >60 mL/min (ref >60)
GFR calc non Af Amer: >60 mL/min (ref >60)
Glucose, Bld: 164 mg/dL — ABNORMAL HIGH (ref 65–99)
Potassium: 4.6 mmol/L (ref 3.5–5.1)
Sodium: 135 mmol/L (ref 135–145)

## 2017-01-01 LAB — GLUCOSE, CAPILLARY
Glucose-Capillary: 138 mg/dL — ABNORMAL HIGH (ref 65–99)
Glucose-Capillary: 140 mg/dL — ABNORMAL HIGH (ref 65–99)
Glucose-Capillary: 146 mg/dL — ABNORMAL HIGH (ref 65–99)
Glucose-Capillary: 170 mg/dL — ABNORMAL HIGH (ref 65–99)

## 2017-01-01 LAB — MRSA PCR SCREENING: MRSA by PCR: POSITIVE — AB

## 2017-01-01 LAB — PHOSPHORUS
Phosphorus: 2.2 mg/dL — ABNORMAL LOW (ref 2.5–4.6)
Phosphorus: 3.5 mg/dL (ref 2.5–4.6)
Phosphorus: 2.7 mg/dL (ref 2.5–4.6)

## 2017-01-01 LAB — MAGNESIUM
Magnesium: 1.6 mg/dL — ABNORMAL LOW (ref 1.7–2.4)
Magnesium: 1.8 mg/dL (ref 1.7–2.4)
Magnesium: 1.8 mg/dL (ref 1.7–2.4)

## 2017-01-01 LAB — BRAIN NATRIURETIC PEPTIDE: B Natriuretic Peptide: 37 pg/mL (ref 0.0–100.0)

## 2017-01-01 MED ORDER — VITAL HIGH PROTEIN PO LIQD
1000.0000 mL | ORAL | Status: DC
  Administered 2017-01-01: 1000 mL

## 2017-01-01 MED ORDER — MIDAZOLAM HCL 5 MG/ML IJ SOLN
1.0000 mg/h | INTRAMUSCULAR | Status: DC
  Administered 2017-01-01 – 2017-01-03 (×4): 3 mg/h via INTRAVENOUS
  Administered 2017-01-04: 4 mg/h via INTRAVENOUS
  Administered 2017-01-04: 3 mg/h via INTRAVENOUS
  Administered 2017-01-05: 6 mg/h via INTRAVENOUS
  Administered 2017-01-05: 3 mg/h via INTRAVENOUS
  Filled 2017-01-01 (×9): qty 10

## 2017-01-01 MED ORDER — VITAL HIGH PROTEIN PO LIQD: 1000.0000 mL | ORAL | Status: DC

## 2017-01-01 MED ORDER — FUROSEMIDE 10 MG/ML IJ SOLN
20.0000 mg | Freq: Two times a day (BID) | INTRAMUSCULAR | Status: AC
  Administered 2017-01-01 (×2): 20 mg via INTRAVENOUS
  Filled 2017-01-01 (×2): qty 2

## 2017-01-01 MED ORDER — SENNOSIDES 8.8 MG/5ML PO SYRP
5.0000 mL | ORAL_SOLUTION | Freq: Two times a day (BID) | ORAL | Status: DC
  Administered 2017-01-03 (×2): 5 mL
  Filled 2017-01-01 (×6): qty 5

## 2017-01-01 MED ORDER — ACETAMINOPHEN 650 MG RE SUPP: 650.0000 mg | RECTAL | Status: DC | PRN

## 2017-01-01 MED ORDER — HYDROMORPHONE HCL 1 MG/ML IJ SOLN
1.0000 mg | INTRAMUSCULAR | Status: DC | PRN
  Administered 2017-01-01 – 2017-01-03 (×10): 1 mg via INTRAVENOUS
  Filled 2017-01-01: qty 1
  Filled 2017-01-01: qty 0.5
  Filled 2017-01-01 (×3): qty 1
  Filled 2017-01-01: qty 0.5
  Filled 2017-01-01 (×2): qty 1
  Filled 2017-01-01: qty 0.5
  Filled 2017-01-01: qty 1
  Filled 2017-01-01: qty 0.5
  Filled 2017-01-01: qty 1

## 2017-01-01 MED ORDER — ORAL CARE MOUTH RINSE
15.0000 mL | Freq: Four times a day (QID) | OROMUCOSAL | Status: DC
  Administered 2017-01-01 – 2017-01-11 (×36): 15 mL via OROMUCOSAL

## 2017-01-01 MED ORDER — IPRATROPIUM-ALBUTEROL 0.5-2.5 (3) MG/3ML IN SOLN
3.0000 mL | Freq: Four times a day (QID) | RESPIRATORY_TRACT | Status: DC
  Administered 2017-01-01 – 2017-01-13 (×49): 3 mL via RESPIRATORY_TRACT
  Filled 2017-01-01 (×49): qty 3

## 2017-01-01 MED ORDER — CHLORHEXIDINE GLUCONATE CLOTH 2 % EX PADS
6.0000 | MEDICATED_PAD | Freq: Every day | CUTANEOUS | Status: DC
  Administered 2017-01-01 – 2017-01-03 (×3): 6 via TOPICAL

## 2017-01-01 MED ORDER — DOCUSATE SODIUM 50 MG/5ML PO LIQD
100.0000 mg | Freq: Two times a day (BID) | ORAL | Status: DC
  Administered 2017-01-01 – 2017-01-04 (×4): 100 mg
  Filled 2017-01-01 (×6): qty 10

## 2017-01-01 MED ORDER — CHLORHEXIDINE GLUCONATE 0.12% ORAL RINSE (MEDLINE KIT)
15.0000 mL | Freq: Two times a day (BID) | OROMUCOSAL | Status: DC
  Administered 2017-01-01 – 2017-01-10 (×18): 15 mL via OROMUCOSAL

## 2017-01-01 MED ORDER — MUPIROCIN 2 % EX OINT
1.0000 "application " | TOPICAL_OINTMENT | Freq: Two times a day (BID) | CUTANEOUS | Status: AC
  Administered 2017-01-01 – 2017-01-05 (×10): 1 via NASAL
  Filled 2017-01-01: qty 22

## 2017-01-01 MED FILL — Medication: Qty: 1 | Status: AC

## 2017-01-01 NOTE — Progress Notes (Signed)
[facility]  Patient Status: [facility]. Collie SiadAngela Johnson, CM made aware.      Eric CaseyJason E Lyons 01/01/2017, 9:46 AM

## 2017-01-01 NOTE — Progress Notes (Signed)
[name], N.P. Notified of pt's +MRSA PCR swab.

## 2017-01-01 NOTE — Care Management (Signed)
Patient recently discharged to home in Dec. 2018 with new O2 and nebulizer provided by [facility]. Home health was also requested however patient declined start of care on 12/08/16.

## 2017-01-01 NOTE — Progress Notes (Signed)
Initial Nutrition Assessment  DOCUMENTATION CODES:   Morbid obesity  INTERVENTION:  -Discussed nutrition poc during ICU rounds and received verbal order from MD Ramachandran to start TF at low rate. Recommend starting Vital High Protein at rate of 20 ml/hr, goal of 65 ml/hr providing 1560 kcals, 137 g of protein and 1310 mL of free water. Meets 100% estimated calorie and protein needs. Continue to assess   NUTRITION DIAGNOSIS:   Inadequate oral intake related to acute illness as evidenced by NPO status.  OAL:   Provide needs based on ASPEN/SCCM guidelines  MONITOR:   TF tolerance, Vent status, Labs, Weight trends  REASON FOR ASSESSMENT:   Ventilator    ASSESSMENT:    51 yo male admitted with acute respiratory failure with COPD  Pt remains on vent support Diprivan on hold due to elevated TG OG tube in stomach, LIS with minimal output but abdomen distended CT abdomen negative for acute GI process  Labs: phosphorus 2.2, magnesium 1.6, TG 635 Meds: lasix, fentanyl/versed  Diet Order:  Diet NPO time specified  Skin:  Reviewed, no issues  Last BM:  no documented BM  Height:   Ht Readings from Last 1 Encounters:  01/01/17 5\' 6"  (1.676 m)    Weight:   Wt Readings from Last 1 Encounters:  01/01/17 262 lb 9.1 oz (119.1 kg)    Filed Weights   12/31/16 1656 01/01/17 0000 01/01/17 0433  Weight: 286 lb 6 oz (129.9 kg) 262 lb 9.1 oz (119.1 kg) 262 lb 9.1 oz (119.1 kg)  ' BMI:  Body mass index is 42.38 kg/m.  Estimated Nutritional Needs:   Kcal:  1310-1667 kcals  Protein:  >/= 129 g  Fluid:  >/= 1.7 L  EDUCATION NEEDS:   No education needs identified at this time  Romelle StarcherCate Nimai Burbach MS, RD, LDN [phone] Pager  [phone] Weekend/On-Call Pager

## 2017-01-01 NOTE — Plan of Care (Signed)
Problem: Pain Managment: Goal: General experience of comfort will improve Outcome: Progressing When responsive, pt denies pain.  Premedicated with prn dilaudid prior to wound care on L thigh.  Problem: Skin Integrity: Goal: Risk for impaired skin integrity will decrease Outcome: Progressing Abrasions present on admission cleansed.  L thigh abrasions measure 4cmx1cmx0.25cm deep (distal abrasion) and 3cmx1cmx0.25cm deep (proximal abrasion).  Both abrasions with non-intact edges.  After wounds cleansed with normal saline, pink foam dressing placed.

## 2017-01-01 NOTE — Progress Notes (Signed)
MEDICATION RELATED CONSULT NOTE - INITIAL   Pharmacy Consult for constipation prevention  Allergies  Allergen Reactions  . Prednisone Anaphylaxis  . Fentanyl Rash    Patient Measurements: Height: 5\' 6"  (167.6 cm) Weight: 262 lb 9.1 oz (119.1 kg) IBW/kg (Calculated) : 63.8  Vital Signs: Temp: 100 F (37.8 C) (01/03 1800) Temp Source: (P) Core (Comment) (01/03 1930) BP: 135/74 (01/03 1800) Pulse Rate: 101 (01/03 1800) Intake/Output from previous day: 01/02 0701 - 01/03 0700 In: 1964.3 [I.V.:914.3; IV Piggyback:1050] Out: 1275 [Urine:1275] Intake/Output from this shift: No intake/output data recorded.  Labs:  Recent Labs  12/31/16 1425 01/01/17 0448 01/01/17 1456 01/01/17 1646  WBC 8.8 7.5  --   --   HGB 14.9 14.1  --   --   HCT 44.9 42.9  --   --   PLT 221 204  --   --   CREATININE 0.59* 0.58*  --   --   MG  --  1.6* 1.8 1.8  PHOS  --  2.2* 3.5 2.7  ALBUMIN 3.4*  --   --   --   PROT 7.3  --   --   --   AST 30  --   --   --   ALT 38  --   --   --   ALKPHOS 51  --   --   --   BILITOT 0.6  --   --   --    Estimated Creatinine Clearance: 132.7 mL/min (by C-G formula based on SCr of 0.58 mg/dL (L)).   Microbiology: Recent Results (from the past 720 hour(s))  MRSA PCR Screening     Status: Abnormal   Collection Time: 01/01/17 12:45 AM  Result Value Ref Range Status   MRSA by PCR POSITIVE (A) NEGATIVE Final    Comment:        The GeneXpert MRSA Assay (FDA approved for NASAL specimens only), is one component of a comprehensive MRSA colonization surveillance program. It is not intended to diagnose MRSA infection nor to guide or monitor treatment for MRSA infections. Results Called to: ALICIA RAWLINGS ON 01/01/17 AT 0340 BY TLB.     Medical History: Past Medical History:  Diagnosis Date  . Asthma   . COPD (chronic obstructive pulmonary disease) (HCC)     Assessment: 51 y/o M with a h/o COPD admitted with acute respiratory failure requiring  mechanical ventilation. Patient is sedated with midazolam infusion and PRN hydromorphone. TF started 1/3.   Plan:  Will begin senna/docusate VT bid and continue to follow.   Luisa HartChristy, Zuleima Haser D 01/01/2017,8:23 PM

## 2017-01-02 DIAGNOSIS — J90 Pleural effusion, not elsewhere classified: Secondary | ICD-10-CM

## 2017-01-02 LAB — CBC
HCT: 45.4 % (ref 40.0–52.0)
Hemoglobin: 14.6 g/dL (ref 13.0–18.0)
MCH: 27.8 pg (ref 26.0–34.0)
MCHC: 32.2 g/dL (ref 32.0–36.0)
MCV: 86.2 fL (ref 80.0–100.0)
Platelets: 248 10*3/uL (ref 150–440)
RBC: 5.26 MIL/uL (ref 4.40–5.90)
RDW: 16.1 % — ABNORMAL HIGH (ref 11.5–14.5)
WBC: 7.8 10*3/uL (ref 3.8–10.6)

## 2017-01-02 LAB — BASIC METABOLIC PANEL
Anion gap: 6 (ref 5–15)
BUN: 16 mg/dL (ref 6–20)
CO2: 35 mmol/L — ABNORMAL HIGH (ref 22–32)
Calcium: 8.9 mg/dL (ref 8.9–10.3)
Chloride: 97 mmol/L — ABNORMAL LOW (ref 101–111)
Creatinine, Ser: 0.63 mg/dL (ref 0.61–1.24)
GFR calc Af Amer: >60 mL/min (ref >60)
GFR calc non Af Amer: >60 mL/min (ref >60)
Glucose, Bld: 176 mg/dL — ABNORMAL HIGH (ref 65–99)
Potassium: 3.7 mmol/L (ref 3.5–5.1)
Sodium: 138 mmol/L (ref 135–145)

## 2017-01-02 LAB — PHOSPHORUS
Phosphorus: 3.5 mg/dL (ref 2.5–4.6)
Phosphorus: 4 mg/dL (ref 2.5–4.6)

## 2017-01-02 LAB — GLUCOSE, CAPILLARY
Glucose-Capillary: 163 mg/dL — ABNORMAL HIGH (ref 65–99)
Glucose-Capillary: 162 mg/dL — ABNORMAL HIGH (ref 65–99)
Glucose-Capillary: 169 mg/dL — ABNORMAL HIGH (ref 65–99)
Glucose-Capillary: 128 mg/dL — ABNORMAL HIGH (ref 65–99)
Glucose-Capillary: 135 mg/dL — ABNORMAL HIGH (ref 65–99)

## 2017-01-02 LAB — MAGNESIUM
Magnesium: 2 mg/dL (ref 1.7–2.4)
Magnesium: 2.1 mg/dL (ref 1.7–2.4)

## 2017-01-02 MED ORDER — VITAL HIGH PROTEIN PO LIQD
1000.0000 mL | ORAL | Status: DC
  Administered 2017-01-03 – 2017-01-06 (×5): 1000 mL

## 2017-01-02 MED ORDER — HYDROCORTISONE NA SUCCINATE PF 100 MG IJ SOLR
25.0000 mg | Freq: Two times a day (BID) | INTRAMUSCULAR | Status: DC
  Administered 2017-01-02 – 2017-01-04 (×4): 25 mg via INTRAVENOUS
  Filled 2017-01-02 (×4): qty 2

## 2017-01-02 MED ORDER — FUROSEMIDE 10 MG/ML IJ SOLN
40.0000 mg | Freq: Two times a day (BID) | INTRAMUSCULAR | Status: AC
  Administered 2017-01-02 (×2): 40 mg via INTRAVENOUS
  Filled 2017-01-02 (×2): qty 4

## 2017-01-02 NOTE — Progress Notes (Signed)
Nutrition Follow-up  DOCUMENTATION CODES:   Morbid obesity  INTERVENTION:  TF: increase TF to rate of 40 ml/hr, if tolerating, titrate by 10 mL q 8 hours until goal rate of 65 ml/hr  NUTRITION DIAGNOSIS:   Inadequate oral intake related to acute illness as evidenced by NPO status.  Being addressed via TF  GOAL:   Provide needs based on ASPEN/SCCM guidelines  MONITOR:   TF tolerance, Vent status, Labs, Weight trends  REASON FOR ASSESSMENT:   Ventilator    ASSESSMENT:    Pt remains on vent support  Tolerating Vital High Protein at rate of 20 ml/hr via OG tube  Labs: reviewed Meds: reviewed  Diet Order:  Diet NPO time specified  Skin:  Reviewed, no issues  Last BM:  no documented BM, bowel regimen ordered  Height:   Ht Readings from Last 1 Encounters:  01/01/17 5\' 6"  (1.676 m)    Weight:   Wt Readings from Last 1 Encounters:  01/02/17 250 lb 14.1 oz (113.8 kg)    Filed Weights   01/01/17 0000 01/01/17 0433 01/02/17 0614  Weight: 262 lb 9.1 oz (119.1 kg) 262 lb 9.1 oz (119.1 kg) 250 lb 14.1 oz (113.8 kg)    BMI:  Body mass index is 40.49 kg/m.  Estimated Nutritional Needs:   Kcal:  1310-1667 kcals  Protein:  >/= 129 g  Fluid:  >/= 1.7 L  EDUCATION NEEDS:   No education needs identified at this time  Romelle StarcherCate Krishiv Sandler MS, RD, LDN [phone] Pager  [phone] Weekend/On-Call Pager

## 2017-01-02 NOTE — Progress Notes (Signed)
MEDICATION RELATED CONSULT NOTE - INITIAL   Pharmacy Consult for constipation prevention  51 y/o M with a h/o COPD admitted with acute respiratory failure requiring mechanical ventilation. Patient is sedated with midazolam infusion and PRN hydromorphone (patient with elevated triglycerides and listed allergy to fentanyl). Patient initiated on tube feeds on 1/3.    Plan:  Will increase senna/docusate to 2 tabs BID.    Allergies  Allergen Reactions  . Prednisone Anaphylaxis  . Fentanyl Rash    Patient Measurements: Height: 5\' 6"  (167.6 cm) Weight: 250 lb 14.1 oz (113.8 kg) IBW/kg (Calculated) : 63.8  Vital Signs: Temp: 98.6 F (37 C) (01/04 1600) Temp Source: Core (Comment) (01/04 1600) BP: 132/91 (01/04 1500) Pulse Rate: 92 (01/04 1600) Intake/Output from previous day: 01/03 0701 - 01/04 0700 In: 127.5 [I.V.:27.1; NG/GT:50.3; IV Piggyback:50] Out: 4700 [Urine:4700] Intake/Output from this shift: Total I/O In: 536 [I.V.:66; NG/GT:420; IV Piggyback:50] Out: 1500 [Urine:1500]  Labs:  Recent Labs  12/31/16 1425  01/01/17 0448 01/01/17 1456 01/01/17 1646 01/02/17 0639 01/02/17 0735  WBC 8.8  --  7.5  --   --   --  7.8  HGB 14.9  --  14.1  --   --   --  14.6  HCT 44.9  --  42.9  --   --   --  45.4  PLT 221  --  204  --   --   --  248  CREATININE 0.59*  --  0.58*  --   --  0.63  --   MG  --   < > 1.6* 1.8 1.8 2.0  --   PHOS  --   < > 2.2* 3.5 2.7 3.5  --   ALBUMIN 3.4*  --   --   --   --   --   --   PROT 7.3  --   --   --   --   --   --   AST 30  --   --   --   --   --   --   ALT 38  --   --   --   --   --   --   ALKPHOS 51  --   --   --   --   --   --   BILITOT 0.6  --   --   --   --   --   --   < > = values in this interval not displayed. Estimated Creatinine Clearance: 129.5 mL/min (by C-G formula based on SCr of 0.63 mg/dL).   Microbiology: Recent Results (from the past 720 hour(s))  MRSA PCR Screening     Status: Abnormal   Collection Time: 01/01/17  12:45 AM  Result Value Ref Range Status   MRSA by PCR POSITIVE (A) NEGATIVE Final    Comment:        The GeneXpert MRSA Assay (FDA approved for NASAL specimens only), is one component of a comprehensive MRSA colonization surveillance program. It is not intended to diagnose MRSA infection nor to guide or monitor treatment for MRSA infections. Results Called to: ALICIA RAWLINGS ON 01/01/17 AT 0340 BY TLB.     Medical History: Past Medical History:  Diagnosis Date  . Asthma   . COPD (chronic obstructive pulmonary disease) [facility](HCC)     Pharmacy will continue to monitor and adjust per consult.   Andraya Frigon L 01/02/2017,4:34 PM

## 2017-01-02 NOTE — Procedures (Signed)
CHEST ULTRASOUND:  Both lungs were scanned in mid-scapular line from base to apex. There was minimal pleural fluid seen, there was bibasilar atelectasis with some B-lines on left side indicative of pulmonary edema.   Impression: Bibasilar atelectasis with mild pulmonary edema, no significant pleural effusion noted.
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
## 2017-01-03 ENCOUNTER — Inpatient Hospital Stay: Payer: Medicare Other

## 2017-01-03 DIAGNOSIS — J9811 Atelectasis: Secondary | ICD-10-CM

## 2017-01-03 DIAGNOSIS — J80 Acute respiratory distress syndrome: Secondary | ICD-10-CM

## 2017-01-03 LAB — GLUCOSE, CAPILLARY
Glucose-Capillary: 102 mg/dL — ABNORMAL HIGH (ref 65–99)
Glucose-Capillary: 116 mg/dL — ABNORMAL HIGH (ref 65–99)
Glucose-Capillary: 118 mg/dL — ABNORMAL HIGH (ref 65–99)
Glucose-Capillary: 127 mg/dL — ABNORMAL HIGH (ref 65–99)
Glucose-Capillary: 145 mg/dL — ABNORMAL HIGH (ref 65–99)
Glucose-Capillary: 157 mg/dL — ABNORMAL HIGH (ref 65–99)
Glucose-Capillary: 163 mg/dL — ABNORMAL HIGH (ref 65–99)

## 2017-01-03 LAB — BASIC METABOLIC PANEL
Anion gap: 9 (ref 5–15)
BUN: 26 mg/dL — ABNORMAL HIGH (ref 6–20)
CO2: 37 mmol/L — ABNORMAL HIGH (ref 22–32)
Calcium: 9.2 mg/dL (ref 8.9–10.3)
Chloride: 93 mmol/L — ABNORMAL LOW (ref 101–111)
Creatinine, Ser: 0.63 mg/dL (ref 0.61–1.24)
GFR calc Af Amer: >60 mL/min (ref >60)
GFR calc non Af Amer: >60 mL/min (ref >60)
Glucose, Bld: 138 mg/dL — ABNORMAL HIGH (ref 65–99)
Potassium: 3.4 mmol/L — ABNORMAL LOW (ref 3.5–5.1)
Sodium: 139 mmol/L (ref 135–145)

## 2017-01-03 LAB — CBC
HCT: 47.4 % (ref 40.0–52.0)
Hemoglobin: 15.2 g/dL (ref 13.0–18.0)
MCH: 27.4 pg (ref 26.0–34.0)
MCHC: 32 g/dL (ref 32.0–36.0)
MCV: 85.8 fL (ref 80.0–100.0)
Platelets: 263 10*3/uL (ref 150–440)
RBC: 5.53 MIL/uL (ref 4.40–5.90)
RDW: 16.4 % — ABNORMAL HIGH (ref 11.5–14.5)
WBC: 8.2 10*3/uL (ref 3.8–10.6)

## 2017-01-03 LAB — BLOOD GAS, ARTERIAL
Acid-Base Excess: 15.3 mmol/L — ABNORMAL HIGH (ref 0.0–2.0)
Bicarbonate: 42.7 mmol/L — ABNORMAL HIGH (ref 20.0–28.0)
FIO2: 0.7
MECHVT: 500 mL
Mechanical Rate: 24
O2 Saturation: 94.9 %
PEEP: 10 cmH2O
Patient temperature: 37
pCO2 arterial: 60 mmHg — ABNORMAL HIGH (ref 32.0–48.0)
pH, Arterial: 7.46 — ABNORMAL HIGH (ref 7.350–7.450)
pO2, Arterial: 71 mmHg — ABNORMAL LOW (ref 83.0–108.0)

## 2017-01-03 LAB — HEPARIN INDUCED PLATELET AB (HIT ANTIBODY): Heparin Induced Plt Ab: 0.168 OD (ref 0.000–0.400)

## 2017-01-03 LAB — TRIGLYCERIDES: Triglycerides: 253 mg/dL — ABNORMAL HIGH (ref <150)

## 2017-01-03 MED ORDER — MORPHINE SULFATE (PF) 4 MG/ML IV SOLN
2.0000 mg | INTRAVENOUS | Status: DC | PRN
  Administered 2017-01-03 – 2017-01-05 (×13): 2 mg via INTRAVENOUS
  Filled 2017-01-03 (×13): qty 1

## 2017-01-03 MED ORDER — ENOXAPARIN SODIUM 40 MG/0.4ML ~~LOC~~ SOLN
40.0000 mg | SUBCUTANEOUS | Status: DC
  Administered 2017-01-03 – 2017-01-12 (×10): 40 mg via SUBCUTANEOUS
  Filled 2017-01-03 (×10): qty 0.4

## 2017-01-03 MED ORDER — DIPHENHYDRAMINE HCL 50 MG/ML IJ SOLN
25.0000 mg | Freq: Four times a day (QID) | INTRAMUSCULAR | Status: DC | PRN
  Administered 2017-01-03 – 2017-01-06 (×2): 25 mg via INTRAVENOUS
  Filled 2017-01-03 (×2): qty 1
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
## 2017-01-03 NOTE — Progress Notes (Signed)
Advanced OG tube to 70 cm from previous 65 cm, per previous x-ray and Annabelle Harmanana NP recommendations. Repeat abdominal x-ray to follow. Will continue to monitor patient.

## 2017-01-03 NOTE — Progress Notes (Signed)
Wake up assessment deferred. Patient alert and oriented X4, awake and follows all commands. Versed remained infusing for patient comfort.

## 2017-01-03 NOTE — Progress Notes (Signed)
MEDICATION RELATED CONSULT NOTE - INITIAL   Pharmacy Consult for constipation prevention  51 y/o M with a h/o COPD admitted with acute respiratory failure requiring mechanical ventilation. Patient is sedated with midazolam infusion and PRN morphine (patient with elevated triglycerides and listed allergy to fentanyl). Patient initiated on tube feeds on 1/3.    Plan:  Will continue senna/docusate to 2 tabs BID.    Allergies  Allergen Reactions  . Prednisone Anaphylaxis  . Fentanyl Rash    Patient Measurements: Height: 5\' 6"  (167.6 cm) Weight: 250 lb 14.1 oz (113.8 kg) IBW/kg (Calculated) : 63.8  Vital Signs: Temp: 98.6 F (37 C) (01/05 1900) Temp Source: Rectal (01/05 1600) BP: 122/84 (01/05 1900) Pulse Rate: 83 (01/05 1900) Intake/Output from previous day: 01/04 0701 - 01/05 0700 In: 1229.4 [I.V.:123.7; NG/GT:1005.7; IV Piggyback:100] Out: 2750 [Urine:2750] Intake/Output from this shift: No intake/output data recorded.  Labs:  Recent Labs  01/01/17 0448  01/01/17 1646 01/02/17 0639 01/02/17 0735 01/02/17 1707 01/03/17 0540  WBC 7.5  --   --   --  7.8  --  8.2  HGB 14.1  --   --   --  14.6  --  15.2  HCT 42.9  --   --   --  45.4  --  47.4  PLT 204  --   --   --  248  --  263  CREATININE 0.58*  --   --  0.63  --   --  0.63  MG 1.6*  < > 1.8 2.0  --  2.1  --   PHOS 2.2*  < > 2.7 3.5  --  4.0  --   < > = values in this interval not displayed. Estimated Creatinine Clearance: 129.5 mL/min (by C-G formula based on SCr of 0.63 mg/dL).   Microbiology: Recent Results (from the past 720 hour(s))  MRSA PCR Screening     Status: Abnormal   Collection Time: 01/01/17 12:45 AM  Result Value Ref Range Status   MRSA by PCR POSITIVE (A) NEGATIVE Final    Comment:        The GeneXpert MRSA Assay (FDA approved for NASAL specimens only), is one component of a comprehensive MRSA colonization surveillance program. It is not intended to diagnose MRSA infection nor to guide  or monitor treatment for MRSA infections. Results Called to: ALICIA RAWLINGS ON 01/01/17 AT 0340 BY TLB.     Medical History: Past Medical History:  Diagnosis Date  . Asthma   . COPD (chronic obstructive pulmonary disease) [facility](HCC)     Pharmacy will continue to monitor and adjust per consult.   Luisa Harthristy, Alyissa Whidbee D 01/03/2017,7:52 PM

## 2017-01-03 NOTE — Progress Notes (Signed)
Nutrition Follow-up  DOCUMENTATION CODES:   Morbid obesity  INTERVENTION:  -Recommend increasing TF to goal rate of 65 ml/hr; initiate PEPup; continue to assess -If pt continues without BM, recommend further intervention regarding bowel regimen  NUTRITION DIAGNOSIS:   Inadequate oral intake related to acute illness as evidenced by NPO status.  GOAL:   Provide needs based on ASPEN/SCCM guidelines  MONITOR:   TF tolerance, Vent status, Labs, Weight trends  REASON FOR ASSESSMENT:   Ventilator    ASSESSMENT:   Pt remains on vent support   Vital High Protein infusing at rate of 50 ml/hr. Per Ladona Ridgelaylor RN, abdomen distended. Discussed during ICU rounds and abdominal xray obtained. Per xray, no specific bowel gas pattern with no evidenced of obstruction. OG tube advancement was recommended however. Discussed with Ladona Ridgelaylor RN  Labs: potassium 3.4, FSBS 100s Meds: lasix, senokot, colace  Diet Order:  Diet NPO time specified  Skin:  Reviewed, no issues  Last BM:  no documented BM  Height:   Ht Readings from Last 1 Encounters:  01/03/17 5\' 6"  (1.676 m)    Weight:   Wt Readings from Last 1 Encounters:  01/02/17 250 lb 14.1 oz (113.8 kg)   Filed Weights   01/01/17 0000 01/01/17 0433 01/02/17 0614  Weight: 262 lb 9.1 oz (119.1 kg) 262 lb 9.1 oz (119.1 kg) 250 lb 14.1 oz (113.8 kg)     BMI:  Body mass index is 40.49 kg/m.  Estimated Nutritional Needs:   Kcal:  1310-1667 kcals  Protein:  >/= 129 g  Fluid:  >/= 1.7 L  EDUCATION NEEDS:   No education needs identified at this time  Romelle StarcherCate Senie Lanese MS, RD, LDN [phone] Pager  [phone] Weekend/On-Call Pager

## 2017-01-04 ENCOUNTER — Inpatient Hospital Stay: Payer: Medicare Other

## 2017-01-04 LAB — BASIC METABOLIC PANEL
Anion gap: 7 (ref 5–15)
BUN: 30 mg/dL — ABNORMAL HIGH (ref 6–20)
CO2: 35 mmol/L — ABNORMAL HIGH (ref 22–32)
Calcium: 8.9 mg/dL (ref 8.9–10.3)
Chloride: 97 mmol/L — ABNORMAL LOW (ref 101–111)
Creatinine, Ser: 0.75 mg/dL (ref 0.61–1.24)
GFR calc Af Amer: >60 mL/min (ref >60)
GFR calc non Af Amer: >60 mL/min (ref >60)
Glucose, Bld: 175 mg/dL — ABNORMAL HIGH (ref 65–99)
Potassium: 3.3 mmol/L — ABNORMAL LOW (ref 3.5–5.1)
Sodium: 139 mmol/L (ref 135–145)

## 2017-01-04 LAB — GLUCOSE, CAPILLARY
Glucose-Capillary: 158 mg/dL — ABNORMAL HIGH (ref 65–99)
Glucose-Capillary: 137 mg/dL — ABNORMAL HIGH (ref 65–99)
Glucose-Capillary: 157 mg/dL — ABNORMAL HIGH (ref 65–99)
Glucose-Capillary: 136 mg/dL — ABNORMAL HIGH (ref 65–99)
Glucose-Capillary: 137 mg/dL — ABNORMAL HIGH (ref 65–99)

## 2017-01-04 LAB — BLOOD GAS, ARTERIAL
Acid-Base Excess: 11.5 mmol/L — ABNORMAL HIGH (ref 0.0–2.0)
Bicarbonate: 38.9 mmol/L — ABNORMAL HIGH (ref 20.0–28.0)
FIO2: 0.9
O2 Saturation: 97.1 %
Patient temperature: 37
pCO2 arterial: 60 mmHg — ABNORMAL HIGH (ref 32.0–48.0)
pH, Arterial: 7.42 (ref 7.350–7.450)
pO2, Arterial: 90 mmHg (ref 83.0–108.0)

## 2017-01-04 LAB — CBC
HCT: 45.4 % (ref 40.0–52.0)
Hemoglobin: 14.8 g/dL (ref 13.0–18.0)
MCH: 28.1 pg (ref 26.0–34.0)
MCHC: 32.5 g/dL (ref 32.0–36.0)
MCV: 86.3 fL (ref 80.0–100.0)
Platelets: 280 10*3/uL (ref 150–440)
RBC: 5.26 MIL/uL (ref 4.40–5.90)
RDW: 16 % — ABNORMAL HIGH (ref 11.5–14.5)
WBC: 7.9 10*3/uL (ref 3.8–10.6)

## 2017-01-04 LAB — MAGNESIUM: Magnesium: 2.3 mg/dL (ref 1.7–2.4)

## 2017-01-04 MED ORDER — POTASSIUM CHLORIDE 20 MEQ PO PACK
60.0000 meq | PACK | Freq: Once | ORAL | Status: AC
  Administered 2017-01-04: 60 meq
  Filled 2017-01-04: qty 3

## 2017-01-04 MED ORDER — FUROSEMIDE 10 MG/ML IJ SOLN
20.0000 mg | Freq: Two times a day (BID) | INTRAMUSCULAR | Status: DC
  Administered 2017-01-04 – 2017-01-05 (×4): 20 mg via INTRAVENOUS
  Filled 2017-01-04 (×4): qty 2

## 2017-01-04 MED ORDER — SENNOSIDES 8.8 MG/5ML PO SYRP
10.0000 mL | ORAL_SOLUTION | Freq: Two times a day (BID) | ORAL | Status: DC
  Administered 2017-01-04 – 2017-01-05 (×2): 10 mL
  Filled 2017-01-04 (×2): qty 10

## 2017-01-04 MED ORDER — DOCUSATE SODIUM 50 MG/5ML PO LIQD
200.0000 mg | Freq: Two times a day (BID) | ORAL | Status: DC
  Administered 2017-01-04 – 2017-01-05 (×3): 200 mg
  Filled 2017-01-04 (×3): qty 20

## 2017-01-04 NOTE — Progress Notes (Signed)
Patient looking sad, restless, uncomfortable. Writing thirsty on paper, wanting to get off ventilator.  Emotional support given.  Versed increased to 4

## 2017-01-04 NOTE — Progress Notes (Signed)
Still uncomfortable.  Repositioned.  Will increase versed for comfortabe

## 2017-01-04 NOTE — Plan of Care (Signed)
Problem: ICU Phase Progression Outcomes Goal: O2 sats trending toward baseline Outcome: Not Progressing FIO2 increased to 90% per CPT.  Sats 90%.   Goal: Hemodynamically stable Outcome: Progressing Heart and blood pressure stable Goal: Pain controlled with appropriate interventions Outcome: Progressing Morphine given x's 2 with noted relief in pain
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
## 2017-01-04 NOTE — Progress Notes (Signed)
MEDICATION RELATED CONSULT NOTE - INITIAL   Pharmacy Consult for constipation prevention  51 y/o M with a h/o COPD admitted with acute respiratory failure requiring mechanical ventilation. Patient is sedated with midazolam infusion and PRN morphine (patient with elevated triglycerides and listed allergy to fentanyl). Patient initiated on tube feeds on 1/3.    Plan:  No BM to date. Will increase senna-docusate to 2 bid.    Allergies  Allergen Reactions  . Prednisone Anaphylaxis  . Fentanyl Rash    Patient Measurements: Height: 5\' 6"  (167.6 cm) Weight: 240 lb 1.3 oz (108.9 kg) IBW/kg (Calculated) : 63.8  Vital Signs: Temp: 98.6 F (37 C) (01/06 1500) Temp Source: Core (Comment) (01/06 1200) BP: 132/90 (01/06 1500) Pulse Rate: 83 (01/06 1500) Intake/Output from previous day: 01/05 0701 - 01/06 0700 In: 1621.6 [I.V.:67.5; ZO/XW:9604.5G/GT:1454.2; IV Piggyback:100] Out: 1250 [Urine:1250] Intake/Output from this shift: Total I/O In: 734 [I.V.:24; NG/GT:660; IV Piggyback:50] Out: 1050 [Urine:1050]  Labs:  Recent Labs  01/01/17 1646 01/02/17 0639 01/02/17 0735 01/02/17 1707 01/03/17 0540 01/04/17 0421  WBC  --   --  7.8  --  8.2 7.9  HGB  --   --  14.6  --  15.2 14.8  HCT  --   --  45.4  --  47.4 45.4  PLT  --   --  248  --  263 280  CREATININE  --  0.63  --   --  0.63 0.75  MG 1.8 2.0  --  2.1  --  2.3  PHOS 2.7 3.5  --  4.0  --   --    Estimated Creatinine Clearance: 126.4 mL/min (by C-G formula based on SCr of 0.75 mg/dL).   Microbiology: Recent Results (from the past 720 hour(s))  MRSA PCR Screening     Status: Abnormal   Collection Time: 01/01/17 12:45 AM  Result Value Ref Range Status   MRSA by PCR POSITIVE (A) NEGATIVE Final    Comment:        The GeneXpert MRSA Assay (FDA approved for NASAL specimens only), is one component of a comprehensive MRSA colonization surveillance program. It is not intended to diagnose MRSA infection nor to guide or monitor  treatment for MRSA infections. Results Called to: ALICIA RAWLINGS ON 01/01/17 AT 0340 BY TLB.     Medical History: Past Medical History:  Diagnosis Date  . Asthma   . COPD (chronic obstructive pulmonary disease) [facility](HCC)     Pharmacy will continue to monitor and adjust per consult.   Luisa HartChristy, Michie Molnar D 01/04/2017,4:04 PM

## 2017-01-05 ENCOUNTER — Inpatient Hospital Stay: Payer: Medicare Other

## 2017-01-05 LAB — BASIC METABOLIC PANEL
Anion gap: 8 (ref 5–15)
BUN: 34 mg/dL — ABNORMAL HIGH (ref 6–20)
CO2: 34 mmol/L — ABNORMAL HIGH (ref 22–32)
Calcium: 9.2 mg/dL (ref 8.9–10.3)
Chloride: 99 mmol/L — ABNORMAL LOW (ref 101–111)
Creatinine, Ser: 0.7 mg/dL (ref 0.61–1.24)
GFR calc Af Amer: >60 mL/min (ref >60)
GFR calc non Af Amer: >60 mL/min (ref >60)
Glucose, Bld: 148 mg/dL — ABNORMAL HIGH (ref 65–99)
Potassium: 3.6 mmol/L (ref 3.5–5.1)
Sodium: 141 mmol/L (ref 135–145)

## 2017-01-05 LAB — BLOOD GAS, ARTERIAL
Acid-Base Excess: 12 mmol/L — ABNORMAL HIGH (ref 0.0–2.0)
Bicarbonate: 38.4 mmol/L — ABNORMAL HIGH (ref 20.0–28.0)
FIO2: 0.9
MECHVT: 500 mL
O2 Saturation: 97.4 %
PEEP: 10 cmH2O
Patient temperature: 37
RATE: 24 resp/min
pCO2 arterial: 54 mmHg — ABNORMAL HIGH (ref 32.0–48.0)
pH, Arterial: 7.46 — ABNORMAL HIGH (ref 7.350–7.450)
pO2, Arterial: 90 mmHg (ref 83.0–108.0)

## 2017-01-05 LAB — GLUCOSE, CAPILLARY
Glucose-Capillary: 135 mg/dL — ABNORMAL HIGH (ref 65–99)
Glucose-Capillary: 160 mg/dL — ABNORMAL HIGH (ref 65–99)
Glucose-Capillary: 157 mg/dL — ABNORMAL HIGH (ref 65–99)
Glucose-Capillary: 169 mg/dL — ABNORMAL HIGH (ref 65–99)
Glucose-Capillary: 122 mg/dL — ABNORMAL HIGH (ref 65–99)

## 2017-01-05 LAB — CBC
HCT: 48.1 % (ref 40.0–52.0)
Hemoglobin: 15.4 g/dL (ref 13.0–18.0)
MCH: 28 pg (ref 26.0–34.0)
MCHC: 32.1 g/dL (ref 32.0–36.0)
MCV: 87.1 fL (ref 80.0–100.0)
Platelets: 295 10*3/uL (ref 150–440)
RBC: 5.52 MIL/uL (ref 4.40–5.90)
RDW: 16.3 % — ABNORMAL HIGH (ref 11.5–14.5)
WBC: 8.9 10*3/uL (ref 3.8–10.6)

## 2017-01-05 LAB — MAGNESIUM: Magnesium: 2.2 mg/dL (ref 1.7–2.4)

## 2017-01-05 LAB — PHOSPHORUS: Phosphorus: 4 mg/dL (ref 2.5–4.6)

## 2017-01-05 MED ORDER — ONDANSETRON HCL 4 MG/2ML IJ SOLN: 4.0000 mg | Freq: Four times a day (QID) | INTRAMUSCULAR | Status: DC | PRN

## 2017-01-05 MED ORDER — ONDANSETRON HCL 4 MG/2ML IJ SOLN
INTRAMUSCULAR | Status: AC
  Administered 2017-01-05: 4 mg
  Filled 2017-01-05: qty 2
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
## 2017-01-05 NOTE — Care Management Note (Signed)
Case Management Note  Patient Details  Name: Shaune PollackWilliam F Teaney MRN: [id number] Date of Birth: November 01, 1966  Subjective/Objective:                    Action/Plan:   Expected Discharge Date:                  Expected Discharge Plan:     In-House Referral:     Discharge planning Services     Post Acute Care Choice:    Choice offered to:     DME Arranged:    DME Agency:     HH Arranged:    HH Agency:     Status of Service:     If discussed at [employer] Length of Stay Meetings, dates discussed:    Additional Comments:  Yvone NeuCrutchfield, Jayshawn Colston M, RN 01/05/2017, 8:36 PM

## 2017-01-06 ENCOUNTER — Inpatient Hospital Stay: Payer: Medicare Other

## 2017-01-06 ENCOUNTER — Inpatient Hospital Stay (HOSPITAL_COMMUNITY)
Admit: 2017-01-06 | Discharge: 2017-01-06 | Disposition: A | Discharge status: Home / Self Care | Payer: Medicare Other | Attending: Internal Medicine | Admitting: Internal Medicine

## 2017-01-06 DIAGNOSIS — J9621 Acute and chronic respiratory failure with hypoxia: Principal | ICD-10-CM

## 2017-01-06 DIAGNOSIS — J9622 Acute and chronic respiratory failure with hypercapnia: Secondary | ICD-10-CM

## 2017-01-06 DIAGNOSIS — R0600 Dyspnea, unspecified: Secondary | ICD-10-CM

## 2017-01-06 LAB — ECHOCARDIOGRAM COMPLETE
Height: 66 in
Weight: 3728.42 oz

## 2017-01-06 LAB — BASIC METABOLIC PANEL
Anion gap: 8 (ref 5–15)
BUN: 34 mg/dL — ABNORMAL HIGH (ref 6–20)
CO2: 37 mmol/L — ABNORMAL HIGH (ref 22–32)
Calcium: 9.6 mg/dL (ref 8.9–10.3)
Chloride: 96 mmol/L — ABNORMAL LOW (ref 101–111)
Creatinine, Ser: 0.63 mg/dL (ref 0.61–1.24)
GFR calc Af Amer: >60 mL/min (ref >60)
GFR calc non Af Amer: >60 mL/min (ref >60)
Glucose, Bld: 167 mg/dL — ABNORMAL HIGH (ref 65–99)
Potassium: 4 mmol/L (ref 3.5–5.1)
Sodium: 141 mmol/L (ref 135–145)

## 2017-01-06 LAB — CBC
HCT: 47.4 % (ref 40.0–52.0)
Hemoglobin: 15.4 g/dL (ref 13.0–18.0)
MCH: 28.2 pg (ref 26.0–34.0)
MCHC: 32.5 g/dL (ref 32.0–36.0)
MCV: 86.8 fL (ref 80.0–100.0)
Platelets: 315 10*3/uL (ref 150–440)
RBC: 5.47 MIL/uL (ref 4.40–5.90)
RDW: 16 % — ABNORMAL HIGH (ref 11.5–14.5)
WBC: 9.8 10*3/uL (ref 3.8–10.6)

## 2017-01-06 LAB — GLUCOSE, CAPILLARY
Glucose-Capillary: 130 mg/dL — ABNORMAL HIGH (ref 65–99)
Glucose-Capillary: 145 mg/dL — ABNORMAL HIGH (ref 65–99)
Glucose-Capillary: 158 mg/dL — ABNORMAL HIGH (ref 65–99)
Glucose-Capillary: 134 mg/dL — ABNORMAL HIGH (ref 65–99)
Glucose-Capillary: 125 mg/dL — ABNORMAL HIGH (ref 65–99)
Glucose-Capillary: 141 mg/dL — ABNORMAL HIGH (ref 65–99)
Glucose-Capillary: 169 mg/dL — ABNORMAL HIGH (ref 65–99)

## 2017-01-06 MED ORDER — KETOROLAC TROMETHAMINE 30 MG/ML IJ SOLN
60.0000 mg | Freq: Once | INTRAMUSCULAR | Status: AC
  Administered 2017-01-06: 60 mg via INTRAVENOUS
  Filled 2017-01-06: qty 2

## 2017-01-06 MED ORDER — ACETAMINOPHEN 325 MG PO TABS
650.0000 mg | ORAL_TABLET | ORAL | Status: DC | PRN
  Administered 2017-01-06 – 2017-01-13 (×12): 650 mg via ORAL
  Filled 2017-01-06 (×12): qty 2

## 2017-01-06 NOTE — Progress Notes (Signed)
Pt extubated without complications, no stridor noted, respiratory rate 22/min, sats 88% on 6lpm [place], will continue to monitor
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
## 2017-01-07 DIAGNOSIS — S299XXD Unspecified injury of thorax, subsequent encounter: Secondary | ICD-10-CM

## 2017-01-07 LAB — GLUCOSE, CAPILLARY
Glucose-Capillary: 123 mg/dL — ABNORMAL HIGH (ref 65–99)
Glucose-Capillary: 143 mg/dL — ABNORMAL HIGH (ref 65–99)
Glucose-Capillary: 161 mg/dL — ABNORMAL HIGH (ref 65–99)
Glucose-Capillary: 161 mg/dL — ABNORMAL HIGH (ref 65–99)

## 2017-01-07 MED ORDER — KETOROLAC TROMETHAMINE 30 MG/ML IJ SOLN
30.0000 mg | Freq: Four times a day (QID) | INTRAMUSCULAR | Status: DC | PRN
  Administered 2017-01-07 – 2017-01-10 (×4): 30 mg via INTRAVENOUS
  Filled 2017-01-07 (×4): qty 1
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
## 2017-01-07 NOTE — Progress Notes (Signed)
Pt refused BiPAP HS. Pt stated he does not wear CPAP/BiPAP HS at home and is comfortable on [place]. RT will continue to monitor.

## 2017-01-07 NOTE — Progress Notes (Signed)
Chart reviewed. ST spoke with Pt and Nsg. Pt has been off ventilator since yesterday and has been tolerating a 2 gm na diet without difficulty. Pt drank several sips of thin liquid while St was present without s/s of aspiration. Pt and Nsg agree with no need for bedside swallow eval at this time. Nsg to notify ST if any difficulty swallowing and we can proceed with eval. No hx of dysphagia.

## 2017-01-08 ENCOUNTER — Inpatient Hospital Stay: Payer: Medicare Other

## 2017-01-08 MED ORDER — TRAZODONE HCL 50 MG PO TABS
50.0000 mg | ORAL_TABLET | Freq: Every evening | ORAL | Status: DC | PRN
  Administered 2017-01-08 – 2017-01-11 (×3): 50 mg via ORAL
  Filled 2017-01-08 (×3): qty 1

## 2017-01-08 MED ORDER — DIPHENHYDRAMINE HCL 25 MG PO CAPS
ORAL_CAPSULE | ORAL | Status: AC
  Filled 2017-01-08: qty 1

## 2017-01-08 MED ORDER — DIPHENHYDRAMINE HCL 25 MG PO CAPS
25.0000 mg | ORAL_CAPSULE | Freq: Once | ORAL | Status: AC
  Administered 2017-01-08: 25 mg via ORAL

## 2017-01-08 NOTE — Progress Notes (Signed)
Patient a/ox4, pleasant participating in care. Difficulty sleeping overnight patient states "Ive been up longer than 24hrs) given benadryl 25mg  given po @0300 . Patient resting/sleeping. Will continue to monitor. See CHL for further documentation
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
## 2017-01-08 NOTE — Progress Notes (Signed)
Nutrition Follow-up  DOCUMENTATION CODES:   Morbid obesity  INTERVENTION:  -Cater to pt preferences -If po intake inadequate on follow-up, recommend addition of nutritional supplement  NUTRITION DIAGNOSIS:   Inadequate oral intake related to acute illness as evidenced by NPO status.  Improving as diet advanced post extubation and pt good appetite, eating well  GOAL:   Patient will meet greater than or equal to 90% of their needs  MONITOR:   PO intake, Labs, Weight trends  REASON FOR ASSESSMENT:   Ventilator    ASSESSMENT:    Extubated on 01/06/17; currently on 5L [place]  2g sodium diet, no recorded po intake. Per Sudie GrumblingJean RN, pt with good appetite, eating 100% of meals  Labs: reviewed Meds: reviewed  Diet Order:  Diet 2 gram sodium Room service appropriate? Yes; Fluid consistency: Thin  Skin:  Reviewed, no issues  Last BM:  01/07/17  Height:   Ht Readings from Last 1 Encounters:  01/04/17 5\' 6"  (1.676 m)    Weight:   Wt Readings from Last 1 Encounters:  01/08/17 236 lb 12.4 oz (107.4 kg)   Filed Weights   01/06/17 0417 01/07/17 0452 01/08/17 0555  Weight: 233 lb 0.4 oz (105.7 kg) 236 lb 15.9 oz (107.5 kg) 236 lb 12.4 oz (107.4 kg)    Ideal Body Weight:  64.5 kg  BMI:  Body mass index is 38.22 kg/m.  Estimated Nutritional Needs:   Kcal:  2000-2300 kcals  Protein:  1000-115 g  Fluid:  >/= 2 L  EDUCATION NEEDS:   No education needs identified at this time  Romelle StarcherCate Eulalia Ellerman MS, RD, LDN [phone] Pager  [phone] Weekend/On-Call Pager
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
## 2017-01-09 DIAGNOSIS — E662 Morbid (severe) obesity with alveolar hypoventilation: Secondary | ICD-10-CM

## 2017-01-09 LAB — CBC
HCT: 44.6 % (ref 40.0–52.0)
Hemoglobin: 14.5 g/dL (ref 13.0–18.0)
MCH: 27.6 pg (ref 26.0–34.0)
MCHC: 32.4 g/dL (ref 32.0–36.0)
MCV: 85.2 fL (ref 80.0–100.0)
Platelets: 361 10*3/uL (ref 150–440)
RBC: 5.23 MIL/uL (ref 4.40–5.90)
RDW: 15.8 % — ABNORMAL HIGH (ref 11.5–14.5)
WBC: 7.7 10*3/uL (ref 3.8–10.6)

## 2017-01-09 LAB — CREATININE, SERUM
Creatinine, Ser: 0.6 mg/dL — ABNORMAL LOW (ref 0.61–1.24)
GFR calc Af Amer: >60 mL/min (ref >60)
GFR calc non Af Amer: >60 mL/min (ref >60)

## 2017-01-09 LAB — EXPECTORATED SPUTUM ASSESSMENT W GRAM STAIN, RFLX TO RESP C

## 2017-01-09 LAB — EXPECTORATED SPUTUM ASSESSMENT W REFEX TO RESP CULTURE

## 2017-01-09 MED ORDER — DIPHENHYDRAMINE HCL 25 MG PO CAPS
25.0000 mg | ORAL_CAPSULE | Freq: Four times a day (QID) | ORAL | Status: DC | PRN
  Administered 2017-01-09: 25 mg via ORAL
  Filled 2017-01-09: qty 1

## 2017-01-09 NOTE — Progress Notes (Signed)
Pt transferred to room 153, report given to RN receiving pt.  Pt is alert and oriented, NSR, expiratory wheezing upon auscultation.  6L [place], pt to wear Bipap at night.   VSS, afebrile.
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
## 2017-01-09 NOTE — Care Management Note (Signed)
Case Management Note  Patient Details  Name: Eric Lyons MRN: [id number] Date of Birth: 1966-06-14  Subjective/Objective:                  Met with patient to discuss discharge planning. Per PT patient would benefit from outpatient PT and pulmonary rehab as outpatient. Patient agrees. He states his PCP is [facility] ([place] NP [phone]) has attempted to make outpatient sleep study but patient admits that he keeps declining it but "I will go now". He agrees that I attempt to reach PCP to make this arrangement. He is independent at home/drives. He is also on chronic O2 at home through [facility].  Action/Plan: This RNCM contacted [facility]. Follow up appointment made and placed on discharge follow up note. Sleep study will be set up.   Expected Discharge Date:                  Expected Discharge Plan:     In-House Referral:  PCP / [facility]  Discharge planning Services  CM Consult  Post Acute Care Choice:    Choice offered to:  Patient  DME Arranged:    DME Agency:     HH Arranged:    [place] Agency:     Status of Service:  Completed, signed off  If discussed at [employer] of Stay Meetings, dates discussed:    Additional Comments:  Marshell Garfinkel, RN 01/09/2017, 12:16 PM
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
## 2017-01-10 ENCOUNTER — Inpatient Hospital Stay: Payer: Medicare Other

## 2017-01-10 LAB — BASIC METABOLIC PANEL
Anion gap: 10 (ref 5–15)
BUN: 21 mg/dL — ABNORMAL HIGH (ref 6–20)
CO2: 30 mmol/L (ref 22–32)
Calcium: 9.2 mg/dL (ref 8.9–10.3)
Chloride: 98 mmol/L — ABNORMAL LOW (ref 101–111)
Creatinine, Ser: 0.57 mg/dL — ABNORMAL LOW (ref 0.61–1.24)
GFR calc Af Amer: >60 mL/min (ref >60)
GFR calc non Af Amer: >60 mL/min (ref >60)
Glucose, Bld: 138 mg/dL — ABNORMAL HIGH (ref 65–99)
Potassium: 4.4 mmol/L (ref 3.5–5.1)
Sodium: 138 mmol/L (ref 135–145)

## 2017-01-10 MED ORDER — LEVOFLOXACIN 500 MG PO TABS
500.0000 mg | ORAL_TABLET | Freq: Every day | ORAL | Status: DC
  Administered 2017-01-10 – 2017-01-13 (×4): 500 mg via ORAL
  Filled 2017-01-10 (×4): qty 1

## 2017-01-10 MED ORDER — BUDESONIDE 0.25 MG/2ML IN SUSP: 0.2500 mg | Freq: Four times a day (QID) | RESPIRATORY_TRACT | Status: DC

## 2017-01-10 MED ORDER — BUDESONIDE 0.25 MG/2ML IN SUSP: 0.2500 mg | Freq: Two times a day (BID) | RESPIRATORY_TRACT | Status: DC

## 2017-01-10 MED ORDER — DEXAMETHASONE 4 MG PO TABS
6.0000 mg | ORAL_TABLET | Freq: Every day | ORAL | Status: DC
  Administered 2017-01-10 – 2017-01-13 (×4): 6 mg via ORAL
  Filled 2017-01-10 (×4): qty 2

## 2017-01-10 MED ORDER — BUDESONIDE 0.25 MG/2ML IN SUSP
0.2500 mg | Freq: Four times a day (QID) | RESPIRATORY_TRACT | Status: DC
Start: 2017-01-10 — End: 2017-01-13
  Administered 2017-01-10 – 2017-01-13 (×13): 0.25 mg via RESPIRATORY_TRACT
  Filled 2017-01-10 (×13): qty 2

## 2017-01-10 MED ORDER — FLUTICASONE PROPIONATE 50 MCG/ACT NA SUSP
2.0000 | Freq: Every day | NASAL | Status: DC
  Administered 2017-01-10 – 2017-01-13 (×4): 2 via NASAL
  Filled 2017-01-10 (×2): qty 16
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
## 2017-01-10 NOTE — Progress Notes (Signed)
Assessments have remained unchanged since this a.m, pt has been up with no assist, has been sitting up in the chair most of the day. No complaints of pain since earlier this a.m. VS wnl as charted. Left knee dressing changed per order, IV dressing changed as well.

## 2017-01-11 ENCOUNTER — Inpatient Hospital Stay: Payer: Medicare Other

## 2017-01-11 DIAGNOSIS — J449 Chronic obstructive pulmonary disease, unspecified: Secondary | ICD-10-CM

## 2017-01-11 DIAGNOSIS — G4733 Obstructive sleep apnea (adult) (pediatric): Secondary | ICD-10-CM
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
## 2017-01-11 NOTE — Progress Notes (Signed)
Pt requested RT to place on bipap at 0100, RT entered room at this time and pt is now requesting to be placed on bipap at 0200. RT informed pt to have RN call RT when pt is ready to be placed on bipap, pt agreed. Bipap at bedside, no distress noted at this time.

## 2017-01-12 LAB — CULTURE, RESPIRATORY W GRAM STAIN: Culture: NORMAL

## 2017-01-12 LAB — CULTURE, RESPIRATORY
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
## 2017-01-12 NOTE — Care Management Note (Addendum)
Case Management Note  Patient Details  Name: Shaune PollackWilliam F Derocher MRN: [id number] Date of Birth: 07/12/1966  Subjective/Objective:           Mr Dillard CannonMcBane refused Bipap twice last night. Did not have it on at change of shift this morning. Is on chronic oxygen at home. Mr Dillard CannonMcBane agreed last week to call and schedule an outpatient sleep study through his PCP.          Action/Plan:   Expected Discharge Date:                  Expected Discharge Plan:     In-House Referral:  PCP / [facility] Connect  Discharge planning Services  CM Consult  Post Acute Care Choice:    Choice offered to:  Patient  DME Arranged:    DME Agency:     HH Arranged:    HH Agency:     Status of Service:  Completed, signed off  If discussed at [employer] Length of Stay Meetings, dates discussed:    Additional Comments:  Annalisa Colonna A, RN 01/12/2017, 8:44 AM

## 2017-01-12 NOTE — Progress Notes (Signed)
Bipap declined.  Pt will call when he is ready for it.

## 2017-01-13 ENCOUNTER — Other Ambulatory Visit: Payer: Self-pay | Admitting: *Deleted

## 2017-01-13 DIAGNOSIS — J449 Chronic obstructive pulmonary disease, unspecified: Secondary | ICD-10-CM

## 2017-01-13 LAB — CBC
HCT: 43.2 % (ref 40.0–52.0)
Hemoglobin: 14.5 g/dL (ref 13.0–18.0)
MCH: 28.4 pg (ref 26.0–34.0)
MCHC: 33.6 g/dL (ref 32.0–36.0)
MCV: 84.5 fL (ref 80.0–100.0)
Platelets: 443 10*3/uL — ABNORMAL HIGH (ref 150–440)
RBC: 5.11 MIL/uL (ref 4.40–5.90)
RDW: 15.3 % — ABNORMAL HIGH (ref 11.5–14.5)
WBC: 9.7 10*3/uL (ref 3.8–10.6)

## 2017-01-13 LAB — CREATININE, SERUM
Creatinine, Ser: 0.65 mg/dL (ref 0.61–1.24)
GFR calc Af Amer: >60 mL/min (ref >60)
GFR calc non Af Amer: >60 mL/min (ref >60)

## 2017-01-13 MED ORDER — DEXAMETHASONE 6 MG PO TABS: 6.0000 mg | ORAL_TABLET | Freq: Every day | ORAL | 0 refills | Status: DC

## 2017-01-13 MED ORDER — FLUTICASONE PROPIONATE 50 MCG/ACT NA SUSP: 2.0000 | Freq: Every day | NASAL | 1 refills | Status: DC

## 2017-01-13 MED ORDER — LEVOFLOXACIN 500 MG PO TABS: 500.0000 mg | ORAL_TABLET | Freq: Every day | ORAL | 0 refills | Status: DC

## 2017-01-13 NOTE — Progress Notes (Signed)
bipap declined at this time.  Pt not ready to go to sleep

## 2017-01-13 NOTE — Care Management Important Message (Signed)
Important Message  Patient Details  Name: Eric Lyons MRN: [id number] Date of Birth: 03/08/1966   Medicare Important Message Given:  Yes    [name], RN 01/13/2017, 12:03 PM

## 2017-01-13 NOTE — Plan of Care (Signed)
Problem: ICU Phase Progression Outcomes Goal: Pain controlled with appropriate interventions Outcome: Completed/Met Date Met: 01/13/17 Pt has met all goals for discharge.

## 2017-01-13 NOTE — Progress Notes (Signed)
Shift assessment completed at 0800. Pt cpap removed by this writer and pt [facility]. Skin is pink,warm and dry, traces of rash remain to pt's feet, arms, etc, feet also dry and peeling. Lungs are decreased bilat, HR regular, abdomen is firm, bs heard. Pt is voiding prn,ppp, non pitting edema noted. After assessment, this writer changed pt's dressing to his L knee per wound care orders. Pt has two gashes in his knee that appear scabbed and dry in wound beds. Pt was dc'd home at 1850, escorted to entrance of facility via wc. Pt received d/c instructions earlier, family memebers arrived with o2 tank from home for transport.
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
## 2017-02-27 ENCOUNTER — Ambulatory Visit (INDEPENDENT_AMBULATORY_CARE_PROVIDER_SITE_OTHER): Payer: Medicare Other | Admitting: Pulmonary Disease

## 2017-02-27 ENCOUNTER — Encounter: Payer: Self-pay | Admitting: Pulmonary Disease

## 2017-02-27 DIAGNOSIS — J449 Chronic obstructive pulmonary disease, unspecified: Secondary | ICD-10-CM | POA: Diagnosis not present

## 2017-02-27 DIAGNOSIS — J9611 Chronic respiratory failure with hypoxia: Secondary | ICD-10-CM

## 2017-02-27 DIAGNOSIS — G4733 Obstructive sleep apnea (adult) (pediatric): Secondary | ICD-10-CM | POA: Diagnosis not present

## 2017-02-27 DIAGNOSIS — E662 Morbid (severe) obesity with alveolar hypoventilation: Secondary | ICD-10-CM

## 2017-02-27 MED ORDER — IPRATROPIUM-ALBUTEROL 0.5-2.5 (3) MG/3ML IN SOLN: 3.0000 mL | Freq: Four times a day (QID) | RESPIRATORY_TRACT | 10 refills | Status: DC | PRN

## 2017-02-27 MED ORDER — IPRATROPIUM-ALBUTEROL 20-100 MCG/ACT IN AERS: 2.0000 | INHALATION_SPRAY | Freq: Four times a day (QID) | RESPIRATORY_TRACT | 10 refills | Status: DC

## 2017-02-27 NOTE — Patient Instructions (Addendum)
1) continue Advair 250/50, one inhalation twice a day 2) use Combivent inhaler, 2 inhalations, as you're first line rescue medication 3) use DuoNeb in the nebulizer as your second line rescue medication 4) continue oxygen therapy - 2 L/m by nasal cannula with exertion and sleep 5) sleep study has been ordered 6) continue your efforts at weight loss with dietary changes and exercise as we have discussed 7) follow-up in 2-3 months with lung function tests and chest x-ray at that time

## 2017-02-28 ENCOUNTER — Other Ambulatory Visit: Payer: Self-pay | Admitting: *Deleted

## 2017-02-28 MED ORDER — IPRATROPIUM-ALBUTEROL 0.5-2.5 (3) MG/3ML IN SOLN
3.0000 mL | Freq: Four times a day (QID) | RESPIRATORY_TRACT | 10 refills | Status: DC
Start: 2017-02-28 — End: 2018-08-06

## 2017-02-28 MED ORDER — IPRATROPIUM-ALBUTEROL 0.5-2.5 (3) MG/3ML IN SOLN: 3.0000 mL | Freq: Four times a day (QID) | RESPIRATORY_TRACT | 10 refills | Status: DC | PRN
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
## 2017-03-07 ENCOUNTER — Encounter: Payer: Self-pay | Admitting: Internal Medicine

## 2017-03-07 DIAGNOSIS — G4733 Obstructive sleep apnea (adult) (pediatric): Secondary | ICD-10-CM

## 2017-03-10 DIAGNOSIS — G4733 Obstructive sleep apnea (adult) (pediatric): Secondary | ICD-10-CM | POA: Diagnosis not present

## 2017-03-11 ENCOUNTER — Telehealth: Payer: Self-pay | Admitting: *Deleted

## 2017-03-11 DIAGNOSIS — G4733 Obstructive sleep apnea (adult) (pediatric): Secondary | ICD-10-CM

## 2017-03-11 NOTE — Telephone Encounter (Signed)
Spoke with DS about results and he states to do in lab titration so that O2 can be bled in as needed. Pt informed. Nothing further needed.

## 2017-04-01 ENCOUNTER — Other Ambulatory Visit: Payer: Self-pay | Admitting: Pulmonary Disease

## 2017-04-02 ENCOUNTER — Telehealth: Payer: Self-pay | Admitting: *Deleted

## 2017-04-02 NOTE — Telephone Encounter (Signed)
Initiated PA thru [facility] for [employer]  Key: [id number] [employer] ID: [id number]   Submitted for review. Will await response.

## 2017-04-03 NOTE — Telephone Encounter (Signed)
Received paper from insurance stating that duoneb needs to be ran under Medicare Part B and not Part D. Called pharmacy to inform them. Gave them ICD code. States they will rerun medication. Nothing further needed at this time.

## 2017-04-09 ENCOUNTER — Encounter: Payer: Self-pay | Admitting: Internal Medicine

## 2017-04-09 ENCOUNTER — Ambulatory Visit: Payer: Medicare Other | Attending: Internal Medicine

## 2017-04-09 DIAGNOSIS — G4736 Sleep related hypoventilation in conditions classified elsewhere: Secondary | ICD-10-CM | POA: Diagnosis not present

## 2017-04-09 DIAGNOSIS — G4733 Obstructive sleep apnea (adult) (pediatric): Secondary | ICD-10-CM | POA: Insufficient documentation

## 2017-04-09 DIAGNOSIS — G4761 Periodic limb movement disorder: Secondary | ICD-10-CM | POA: Insufficient documentation

## 2017-04-17 DIAGNOSIS — G4733 Obstructive sleep apnea (adult) (pediatric): Secondary | ICD-10-CM | POA: Diagnosis not present

## 2017-04-18 ENCOUNTER — Telehealth: Payer: Self-pay | Admitting: *Deleted

## 2017-04-18 DIAGNOSIS — G4733 Obstructive sleep apnea (adult) (pediatric): Secondary | ICD-10-CM

## 2017-04-18 NOTE — Telephone Encounter (Signed)
LMOM for pt to return call. 

## 2017-04-18 NOTE — Telephone Encounter (Signed)
-----   Message from Shane Crutch, MD sent at 04/17/2017  5:24 PM EDT ----- Regarding: PAP titration results.  Bi-Level treatment with pressure of 24/18 with 1L of oxygen.

## 2017-05-02 ENCOUNTER — Other Ambulatory Visit: Payer: Self-pay | Admitting: Pulmonary Disease

## 2017-05-04 ENCOUNTER — Emergency Department: Payer: Medicare Other

## 2017-05-04 ENCOUNTER — Encounter: Payer: Self-pay | Admitting: Emergency Medicine

## 2017-05-04 ENCOUNTER — Inpatient Hospital Stay
Admission: EM | Admit: 2017-05-04 | Discharge: 2017-05-07 | DRG: 189 | Disposition: A | Discharge status: Home / Self Care | Payer: Medicare Other | Attending: Internal Medicine | Admitting: Internal Medicine

## 2017-05-04 DIAGNOSIS — Z9981 Dependence on supplemental oxygen: Secondary | ICD-10-CM

## 2017-05-04 DIAGNOSIS — R739 Hyperglycemia, unspecified: Secondary | ICD-10-CM | POA: Diagnosis not present

## 2017-05-04 DIAGNOSIS — Z79899 Other long term (current) drug therapy: Secondary | ICD-10-CM

## 2017-05-04 DIAGNOSIS — G4733 Obstructive sleep apnea (adult) (pediatric): Secondary | ICD-10-CM | POA: Diagnosis present

## 2017-05-04 DIAGNOSIS — R0902 Hypoxemia: Secondary | ICD-10-CM

## 2017-05-04 DIAGNOSIS — Z6841 Body Mass Index (BMI) 40.0 and over, adult: Secondary | ICD-10-CM

## 2017-05-04 DIAGNOSIS — Z7951 Long term (current) use of inhaled steroids: Secondary | ICD-10-CM | POA: Diagnosis not present

## 2017-05-04 DIAGNOSIS — Z91138 Patient's unintentional underdosing of medication regimen for other reason: Secondary | ICD-10-CM | POA: Diagnosis not present

## 2017-05-04 DIAGNOSIS — T380X5A Adverse effect of glucocorticoids and synthetic analogues, initial encounter: Secondary | ICD-10-CM | POA: Diagnosis present

## 2017-05-04 DIAGNOSIS — J9622 Acute and chronic respiratory failure with hypercapnia: Secondary | ICD-10-CM | POA: Diagnosis present

## 2017-05-04 DIAGNOSIS — J9620 Acute and chronic respiratory failure, unspecified whether with hypoxia or hypercapnia: Secondary | ICD-10-CM | POA: Diagnosis present

## 2017-05-04 DIAGNOSIS — J9621 Acute and chronic respiratory failure with hypoxia: Secondary | ICD-10-CM

## 2017-05-04 DIAGNOSIS — R0602 Shortness of breath: Secondary | ICD-10-CM | POA: Diagnosis present

## 2017-05-04 DIAGNOSIS — J441 Chronic obstructive pulmonary disease with (acute) exacerbation: Secondary | ICD-10-CM | POA: Diagnosis present

## 2017-05-04 DIAGNOSIS — E669 Obesity, unspecified: Secondary | ICD-10-CM | POA: Diagnosis present

## 2017-05-04 LAB — BLOOD GAS, ARTERIAL
Acid-Base Excess: 10.3 mmol/L — ABNORMAL HIGH (ref 0.0–2.0)
Bicarbonate: 41.4 mmol/L — ABNORMAL HIGH (ref 20.0–28.0)
Delivery systems: POSITIVE
Expiratory PAP: 10
FIO2: 0.6
Inspiratory PAP: 18
O2 Saturation: 95.5 %
Patient temperature: 37
pCO2 arterial: 86 mmHg (ref 32.0–48.0)
pH, Arterial: 7.29 — ABNORMAL LOW (ref 7.350–7.450)
pO2, Arterial: 87 mmHg (ref 83.0–108.0)

## 2017-05-04 LAB — BASIC METABOLIC PANEL
Anion gap: 6 (ref 5–15)
BUN: 14 mg/dL (ref 6–20)
CO2: 36 mmol/L — ABNORMAL HIGH (ref 22–32)
Calcium: 8.8 mg/dL — ABNORMAL LOW (ref 8.9–10.3)
Chloride: 97 mmol/L — ABNORMAL LOW (ref 101–111)
Creatinine, Ser: 0.81 mg/dL (ref 0.61–1.24)
GFR calc Af Amer: >60 mL/min (ref >60)
GFR calc non Af Amer: >60 mL/min (ref >60)
Glucose, Bld: 129 mg/dL — ABNORMAL HIGH (ref 65–99)
Potassium: 3.7 mmol/L (ref 3.5–5.1)
Sodium: 139 mmol/L (ref 135–145)

## 2017-05-04 LAB — CBC
HCT: 44.2 % (ref 40.0–52.0)
Hemoglobin: 14.7 g/dL (ref 13.0–18.0)
MCH: 30.3 pg (ref 26.0–34.0)
MCHC: 33.1 g/dL (ref 32.0–36.0)
MCV: 91.5 fL (ref 80.0–100.0)
Platelets: 246 10*3/uL (ref 150–440)
RBC: 4.83 MIL/uL (ref 4.40–5.90)
RDW: 13.7 % (ref 11.5–14.5)
WBC: 8.4 10*3/uL (ref 3.8–10.6)

## 2017-05-04 LAB — MRSA PCR SCREENING: MRSA by PCR: POSITIVE — AB

## 2017-05-04 LAB — GLUCOSE, CAPILLARY: Glucose-Capillary: 188 mg/dL — ABNORMAL HIGH (ref 65–99)

## 2017-05-04 LAB — TROPONIN I: Troponin I: <0.03 ng/mL (ref <0.03)

## 2017-05-04 MED ORDER — CHLORHEXIDINE GLUCONATE CLOTH 2 % EX PADS
6.0000 | MEDICATED_PAD | Freq: Every day | CUTANEOUS | Status: DC
  Administered 2017-05-05 – 2017-05-07 (×3): 6 via TOPICAL

## 2017-05-04 MED ORDER — CHLORHEXIDINE GLUCONATE 0.12 % MT SOLN
15.0000 mL | Freq: Two times a day (BID) | OROMUCOSAL | Status: DC
  Administered 2017-05-05 – 2017-05-07 (×4): 15 mL via OROMUCOSAL
  Filled 2017-05-04 (×4): qty 15

## 2017-05-04 MED ORDER — DEXTROSE 5 % IV SOLN
500.0000 mg | Freq: Once | INTRAVENOUS | Status: AC
  Administered 2017-05-04: 500 mg via INTRAVENOUS
  Filled 2017-05-04: qty 500

## 2017-05-04 MED ORDER — SODIUM CHLORIDE 0.9% FLUSH: 3.0000 mL | INTRAVENOUS | Status: DC | PRN

## 2017-05-04 MED ORDER — MUPIROCIN 2 % EX OINT
1.0000 "application " | TOPICAL_OINTMENT | Freq: Two times a day (BID) | CUTANEOUS | Status: DC
  Administered 2017-05-04 – 2017-05-07 (×6): 1 via NASAL
  Filled 2017-05-04: qty 22

## 2017-05-04 MED ORDER — MOMETASONE FURO-FORMOTEROL FUM 200-5 MCG/ACT IN AERO
2.0000 | INHALATION_SPRAY | Freq: Two times a day (BID) | RESPIRATORY_TRACT | Status: DC
  Filled 2017-05-04: qty 8.8

## 2017-05-04 MED ORDER — BUDESONIDE 0.5 MG/2ML IN SUSP
0.5000 mg | Freq: Two times a day (BID) | RESPIRATORY_TRACT | Status: DC
  Administered 2017-05-05 – 2017-05-07 (×5): 0.5 mg via RESPIRATORY_TRACT
  Filled 2017-05-04 (×5): qty 2

## 2017-05-04 MED ORDER — METHYLPREDNISOLONE SODIUM SUCC 125 MG IJ SOLR: 125.0000 mg | Freq: Once | INTRAMUSCULAR | Status: DC

## 2017-05-04 MED ORDER — ENOXAPARIN SODIUM 40 MG/0.4ML ~~LOC~~ SOLN
40.0000 mg | Freq: Two times a day (BID) | SUBCUTANEOUS | Status: DC
  Administered 2017-05-04 – 2017-05-07 (×6): 40 mg via SUBCUTANEOUS
  Filled 2017-05-04 (×6): qty 0.4

## 2017-05-04 MED ORDER — ALBUTEROL SULFATE (2.5 MG/3ML) 0.083% IN NEBU
5.0000 mg | INHALATION_SOLUTION | Freq: Once | RESPIRATORY_TRACT | Status: AC
  Administered 2017-05-04: 5 mg via RESPIRATORY_TRACT
  Filled 2017-05-04: qty 6

## 2017-05-04 MED ORDER — ACETAMINOPHEN 650 MG RE SUPP
650.0000 mg | Freq: Four times a day (QID) | RECTAL | Status: DC | PRN
Start: 2017-05-04 — End: 2017-05-07

## 2017-05-04 MED ORDER — DEXAMETHASONE SODIUM PHOSPHATE 10 MG/ML IJ SOLN
10.0000 mg | Freq: Once | INTRAMUSCULAR | Status: AC
  Administered 2017-05-04: 10 mg via INTRAVENOUS
  Filled 2017-05-04: qty 1

## 2017-05-04 MED ORDER — ALBUTEROL SULFATE (2.5 MG/3ML) 0.083% IN NEBU
7.5000 mg | INHALATION_SOLUTION | Freq: Once | RESPIRATORY_TRACT | Status: AC
  Administered 2017-05-04: 7.5 mg via RESPIRATORY_TRACT
  Filled 2017-05-04: qty 9

## 2017-05-04 MED ORDER — IPRATROPIUM-ALBUTEROL 0.5-2.5 (3) MG/3ML IN SOLN
9.0000 mL | Freq: Once | RESPIRATORY_TRACT | Status: AC
  Administered 2017-05-04: 9 mL via RESPIRATORY_TRACT
  Filled 2017-05-04: qty 9

## 2017-05-04 MED ORDER — ORAL CARE MOUTH RINSE
15.0000 mL | Freq: Two times a day (BID) | OROMUCOSAL | Status: DC
  Administered 2017-05-06 (×2): 15 mL via OROMUCOSAL

## 2017-05-04 MED ORDER — ACETAMINOPHEN 325 MG PO TABS
650.0000 mg | ORAL_TABLET | Freq: Four times a day (QID) | ORAL | Status: DC | PRN
  Administered 2017-05-06 – 2017-05-07 (×3): 650 mg via ORAL
  Filled 2017-05-04 (×3): qty 2

## 2017-05-04 MED ORDER — METHYLPREDNISOLONE SODIUM SUCC 125 MG IJ SOLR
60.0000 mg | Freq: Four times a day (QID) | INTRAMUSCULAR | Status: DC
  Administered 2017-05-04 – 2017-05-05 (×3): 60 mg via INTRAVENOUS
  Filled 2017-05-04 (×3): qty 2

## 2017-05-04 MED ORDER — IPRATROPIUM-ALBUTEROL 0.5-2.5 (3) MG/3ML IN SOLN
3.0000 mL | Freq: Four times a day (QID) | RESPIRATORY_TRACT | Status: DC
  Administered 2017-05-04 – 2017-05-05 (×3): 3 mL via RESPIRATORY_TRACT
  Filled 2017-05-04 (×3): qty 3

## 2017-05-04 MED ORDER — SODIUM CHLORIDE 0.9% FLUSH
3.0000 mL | Freq: Two times a day (BID) | INTRAVENOUS | Status: DC
Start: 2017-05-04 — End: 2017-05-07
  Administered 2017-05-04 – 2017-05-07 (×6): 3 mL via INTRAVENOUS

## 2017-05-04 MED ORDER — SODIUM CHLORIDE 0.9 % IV SOLN: 250.0000 mL | INTRAVENOUS | Status: DC | PRN

## 2017-05-04 NOTE — Consult Note (Signed)
PULMONARY / CRITICAL CARE MEDICINE   Name: Eric Lyons MRN: [id number] DOB: Oct 07, 1966    ADMISSION DATE:  05/04/2017   CONSULTATION DATE:  05/04/2017  REFERRING MD:  Dr. Letitia Libra  REASON FOR CONSULT: ICU management of acute on chronic COPD exacerbation  CHIEF COMPLAINT:  dyspnea  HISTORY OF PRESENT ILLNESS:   This is a 51 y/o male with a h/o OSA, Asthma and COPD who presented with acute dyspnea and wheezing x 2 days. Associated symptoms include chest pressure which he isn't able to further characterize. Denies productive cough, fever, chills and myalgia. He is on home O2 and states that he ran out of his inhalers. He uses O2 at home (2L [place]). He is suppose to have a CPAP machine at home but states that it hasn't been delivered.  PAST MEDICAL HISTORY :  He  has a past medical history of Asthma and COPD (chronic obstructive pulmonary disease) (HCC).  PAST SURGICAL HISTORY: He  has a past surgical history that includes ankle fracture surgery; Hernia repair; Tonsillectomy; and Knee arthroscopy.  Allergies  Allergen Reactions  . Fentanyl Rash  . Prednisone Other (See Comments)    Irritable    No current facility-administered medications on file prior to encounter.    Current Outpatient Prescriptions on File Prior to Encounter  Medication Sig  . ADVAIR DISKUS 500-50 MCG/DOSE AEPB inhale 1 dose by mouth every 12 hours  . albuterol (PROVENTIL HFA;VENTOLIN HFA) 108 (90 Base) MCG/ACT inhaler Inhale 2 puffs into the lungs every 6 (six) hours as needed for wheezing or shortness of breath.  . fluticasone (FLONASE) 50 MCG/ACT nasal spray Place 2 sprays into both nostrils daily.  . Ipratropium-Albuterol (COMBIVENT) 20-100 MCG/ACT AERS respimat Inhale 2 puffs into the lungs 4 (four) times daily. This is first line rescue medication  . ipratropium-albuterol (DUONEB) 0.5-2.5 (3) MG/3ML SOLN Take 3 mLs by nebulization every 6 (six) hours. DX: COPD DX Code: J44.9  . Fluticasone-Salmeterol  (ADVAIR DISKUS) 250-50 MCG/DOSE AEPB Inhale 1 puff into the lungs 2 (two) times daily. (Patient not taking: Reported on 05/04/2017)    FAMILY HISTORY:  His indicated that his mother is deceased. He indicated that his father is deceased.    SOCIAL HISTORY: He  reports that he has never smoked. He has never used smokeless tobacco. He reports that he does not drink alcohol or use drugs.  REVIEW OF SYSTEMS:   Constitutional: Negative for fever and chills.  HENT: Negative for congestion and rhinorrhea.  Eyes: Negative for redness and visual disturbance.  Respiratory: Positive for shortness of breath and wheezing.  Cardiovascular: Negative for chest pain and palpitations.  Gastrointestinal: Negative  for nausea , vomiting and abdominal pain and  Loose stools Genitourinary: Negative for dysuria and urgency.  Endocrine: Denies polyuria, polyphagia and heat intolerance Musculoskeletal:Positive for back pain Skin: Negative for pallor and wound.  Neurological: Negative for dizziness and headaches   SUBJECTIVE:   VITAL SIGNS: BP 140/81   Pulse 86   Temp 98.2 F (36.8 C) (Oral)   Resp 13   Ht 5\' 5"  (1.651 m)   Wt 245 lb (111.1 kg)   SpO2 95%   BMI 40.77 kg/m   HEMODYNAMICS:    VENTILATOR SETTINGS: FiO2 (%):  [50 %] 50 %  INTAKE / OUTPUT: No intake/output data recorded.  PHYSICAL EXAMINATION: General:  NAD Neuro:  AAO X4, no deficits HEENT: PERRLA, oral mucosa pink Cardiovascular: RRR, S1/S2, no MRG Lungs:  Normal WOB, bilateral breath sounds, mild expiratory wheezes  Abdomen:  Soft, NT/ND, normal bowel sounds Musculoskeletal:  No deformities Skin:  Warm and dry  LABS:  BMET  Recent Labs Lab 05/04/17 1515  NA 139  K 3.7  CL 97*  CO2 36*  BUN 14  CREATININE 0.81  GLUCOSE 129*    Electrolytes  Recent Labs Lab 05/04/17 1515  CALCIUM 8.8*    CBC  Recent Labs Lab 05/04/17 1515  WBC 8.4  HGB 14.7  HCT 44.2  PLT 246    Coag's No results for  input(s): APTT, INR in the last 168 hours.  Sepsis Markers No results for input(s): LATICACIDVEN, PROCALCITON, O2SATVEN in the last 168 hours.  ABG  Recent Labs Lab 05/04/17 1833 05/04/17 2236  PHART 7.24* 7.29*  PCO2ART 91* 86*  PO2ART 60* 87    Liver Enzymes No results for input(s): AST, ALT, ALKPHOS, BILITOT, ALBUMIN in the last 168 hours.  Cardiac Enzymes  Recent Labs Lab 05/04/17 1515  TROPONINI <0.03    Glucose  Recent Labs Lab 05/04/17 2035  GLUCAP 188*    Imaging Dg Chest 2 View  Result Date: 05/04/2017 CLINICAL DATA:  Chest tightness and shortness of breath since yesterday. History of sleep apnea, COPD and asthma. EXAM: CHEST  2 VIEW COMPARISON:  01/10/2017 and 01/08/2017. FINDINGS: Lordotic positioning on the AP view. The heart size and mediastinal contours are stable. Interval improved aeration of the lung bases. There is mild chronic central airway thickening without significant hyperinflation, pleural effusion or pneumothorax. The bones appear unchanged. IMPRESSION: No acute cardiopulmonary process. Chronic central airway thickening. Electronically Signed   By: Carey BullocksWilliam  Veazey M.D.   On: 05/04/2017 16:05     STUDIES:  None  CULTURES: none  ANTIBIOTICS: Azithromycin  SIGNIFICANT EVENTS: 5/6>admitted  LINES/TUBES: PIVs  DISCUSSION: 51 y/o male presenting with acute respiratory failure and acute COPD exacerbation due to medication non-adherence  ASSESSMENT  Acute hypercarbic/hypoxic respiratory failure Acute on chronic COPD exacerbation  PLAN BiPAP and titrate to [place] as tolerated Nebulized steroids and bronchodilator Azithromycin IV solu-medrol ABG in am   FAMILY  - Updates: No family at bedside. Will update when available  - Inter-disciplinary family meet or Palliative Care meeting due by:  day 7  Plan of care discussed with Elink and Dr. Dene GentrySimonds   Domanique Huesman S. [facility] ANP-BC Pulmonary and Critical Care Medicine [facility] Pager [phone] or [phone] 05/04/2017, 11:59 PM

## 2017-05-04 NOTE — ED Notes (Signed)
FIRST NURSE NOTE: Pt was pulled from waiting line and O2 sats were assessed. O2 sat 87% on RA, pt was placed on 2L of [place] like he is supposed to wear at home.

## 2017-05-04 NOTE — Progress Notes (Signed)
Patient ID: Eric PollackWilliam F Lyons, male   DOB: 06/20/1966, 51 y.o.   MRN: [id number]  Patient became more somnolent down in the ER.oxygen saturation dropped some. He still 4 L. Was able to arouse him and he does say that he was diagnosed with sleep apnea and is awaiting a CPAP machine at home. ABG was ordered which showed a PCO2 of 91 and pH is 7.24. Patient as placed on BiPAP. He will be admitted now to the stepdown unit. We'll discuss with eICU doctor on call.  Total critical care time spent 30 minutes

## 2017-05-04 NOTE — ED Notes (Signed)
Pt nods off frequently in triage, but is easily aroused by voice.

## 2017-05-04 NOTE — ED Triage Notes (Signed)
Pt c/o chest tightness and shortness of breath since yesterday. Ran out of Advair and Combivent inhalers.  C/o feeling like an elephant is sitting on his chest.  Also states he had a syncopal episode last night. States he wears oxygen at night when sleeping. Hx of sleep apnea

## 2017-05-04 NOTE — Progress Notes (Signed)
Anticoagulation monitoring(Lovenox):  51 yo male ordered Lovenox 40 mg Q24h  Filed Weights   05/04/17 1511  Weight: 245 lb (111.1 kg)   BMI 40.9   Lab Results  Component Value Date   CREATININE 0.81 05/04/2017   CREATININE 0.65 01/13/2017   CREATININE 0.57 (L) 01/10/2017   Estimated Creatinine Clearance: 124.1 mL/min (by C-G formula based on SCr of 0.81 mg/dL). Hemoglobin & Hematocrit     Component Value Date/Time   HGB 14.7 05/04/2017 1515   HGB 14.2 09/14/2013 0751   HCT 44.2 05/04/2017 1515   HCT 42.7 09/14/2013 0751     Per Protocol for Patient with estCrcl > 30 ml/min and BMI > 40, will transition to Lovenox 40 mg Q12h.

## 2017-05-04 NOTE — ED Provider Notes (Signed)
[facility] Emergency Department Provider Note  ____________________________________________   First MD Initiated Contact with Patient 05/04/17 1528     (approximate)  I have reviewed the triage vital signs and the nursing notes.   HISTORY  Chief Complaint Shortness of Breath   HPI Eric Lyons is a 51 y.o. male with a history of COPD on asthma was on home O2 overnight who is presenting to the emergency department today with 2 days of worsening shortness of breath and cough. He does not report any fever. Says that he feels that he has chest pressure across the front of his chest was mild to moderate. Does not smoke. Says that he has been intubated before for his COPD in the past just this past January. Says that he has also been out of his house over the past 2 days which may be worsening the situation.   Past Medical History:  Diagnosis Date  . Asthma   . COPD (chronic obstructive pulmonary disease) [facility])     Patient Active Problem List   Diagnosis Date Noted  . Acute respiratory failure (HCC) 12/31/2016  . Asthma exacerbation 12/06/2016  . COPD exacerbation (HCC) 11/13/2015  . Hypoxia 11/13/2015    Past Surgical History:  Procedure Laterality Date  . ankle fracture surgery    . HERNIA REPAIR    . KNEE ARTHROSCOPY    . TONSILLECTOMY      Prior to Admission medications   Medication Sig Start Date End Date Taking? Authorizing Provider  ADVAIR DISKUS 500-50 MCG/DOSE AEPB inhale 1 dose by mouth every 12 hours 04/01/17  Yes Merwyn Katos, MD  albuterol (PROVENTIL HFA;VENTOLIN HFA) 108 (90 Base) MCG/ACT inhaler Inhale 2 puffs into the lungs every 6 (six) hours as needed for wheezing or shortness of breath. 12/08/16  Yes Sainani, Rolly Pancake, MD  fluticasone (FLONASE) 50 MCG/ACT nasal spray Place 2 sprays into both nostrils daily. 01/13/17  Yes Katha Hamming, MD  Ipratropium-Albuterol (COMBIVENT) 20-100 MCG/ACT AERS respimat Inhale 2 puffs  into the lungs 4 (four) times daily. This is first line rescue medication 02/27/17  Yes Merwyn Katos, MD  ipratropium-albuterol (DUONEB) 0.5-2.5 (3) MG/3ML SOLN Take 3 mLs by nebulization every 6 (six) hours. DX: COPD DX Code: J44.9 02/28/17  Yes Merwyn Katos, MD  Fluticasone-Salmeterol (ADVAIR DISKUS) 250-50 MCG/DOSE AEPB Inhale 1 puff into the lungs 2 (two) times daily. Patient not taking: Reported on 05/04/2017 12/08/16   Houston Siren, MD    Allergies Fentanyl and Prednisone  Family History  Problem Relation Age of Onset  . Alcohol abuse Mother   . ALS Father     Social History Social History  Substance Use Topics  . Smoking status: Never Smoker  . Smokeless tobacco: Never Used  . Alcohol use No    Review of Systems  Constitutional: No fever/chills Eyes: No visual changes. ENT: No sore throat. Cardiovascular: as above. Respiratory: as above Gastrointestinal: No abdominal pain.  No nausea, no vomiting.  No diarrhea.  No constipation. Genitourinary: Negative for dysuria. Musculoskeletal: Negative for back pain. Skin: Negative for rash. Neurological: Negative for headaches, focal weakness or numbness.   ____________________________________________   PHYSICAL EXAM:  VITAL SIGNS: ED Triage Vitals  Enc Vitals Group     BP 05/04/17 1504 139/76     Pulse Rate 05/04/17 1504 95     Resp 05/04/17 1504 20     Temp 05/04/17 1504 99.4 F (37.4 C)     Temp Source  05/04/17 1504 Oral     SpO2 05/04/17 1450 (!) 87 %     Weight 05/04/17 1511 245 lb (111.1 kg)     Height 05/04/17 1511 5\' 5"  (1.651 m)     Head Circumference --      Peak Flow --      Pain Score 05/04/17 1510 8     Pain Loc --      Pain Edu? --      Excl. in GC? --     Constitutional: Alert and oriented.  Eyes: Conjunctivae are normal. PERRL. EOMI. Head: Atraumatic. Nose: No congestion/rhinnorhea. Mouth/Throat: Mucous membranes are moist.  Neck: No stridor.   Cardiovascular: Normal rate, regular  rhythm. Grossly normal heart sounds.   Respiratory: Labored respirations with severely reduced air movement and coarse wheezing throughout. Gastrointestinal: Soft and nontender. No distention.  Musculoskeletal: No lower extremity tenderness nor edema.  No joint effusions. Neurologic:  Normal speech and language. No gross focal neurologic deficits are appreciated. Skin:  Skin is warm, dry and intact. No rash noted. Psychiatric: Mood and affect are normal. Speech and behavior are normal.  ____________________________________________   LABS (all labs ordered are listed, but only abnormal results are displayed)  Labs Reviewed  BASIC METABOLIC PANEL - Abnormal; Notable for the following:       Result Value   Chloride 97 (*)    CO2 36 (*)    Glucose, Bld 129 (*)    Calcium 8.8 (*)    All other components within normal limits  CBC  TROPONIN I   ____________________________________________  EKG  ED ECG REPORT I, Arelia LongestSchaevitz,  Ellaina Schuler M, the attending physician, personally viewed and interpreted this ECG.   Date: 05/04/2017  EKG Time: 1503  Rate: 94  Rhythm: normal sinus rhythm  Axis: normal  Intervals:none  ST&T Change: No ST segment elevation or depression. No abnormal T-wave inversion.  ____________________________________________  RADIOLOGY  DG Chest 2 View (Final result)  Result time 05/04/17 16:05:17  Final result by Carey BullocksVeazey, Najib, MD (05/04/17 16:05:17)           Narrative:   CLINICAL DATA: Chest tightness and shortness of breath since yesterday. History of sleep apnea, COPD and asthma.  EXAM: CHEST 2 VIEW  COMPARISON: 01/10/2017 and 01/08/2017.  FINDINGS: Lordotic positioning on the AP view. The heart size and mediastinal contours are stable. Interval improved aeration of the lung bases. There is mild chronic central airway thickening without significant hyperinflation, pleural effusion or pneumothorax. The bones appear unchanged.  IMPRESSION: No  acute cardiopulmonary process. Chronic central airway thickening.   Electronically Signed By: Carey BullocksWilliam Veazey M.D. On: 05/04/2017 16:05            ____________________________________________   PROCEDURES  Procedure(s) performed:   Procedures  Critical Care performed:   ____________________________________________   INITIAL IMPRESSION / ASSESSMENT AND PLAN / ED COURSE  Pertinent labs & imaging results that were available during my care of the patient were reviewed by me and considered in my medical decision making (see chart for details).  ----------------------------------------- 5:13 PM on 05/04/2017 -----------------------------------------  She continues to have wheezing and shortness of breath despite nebs and steroids. He will be admitted to the hospital. Signed out to Dr. Cherlynn KaiserSainani.      ____________________________________________   FINAL CLINICAL IMPRESSION(S) / ED DIAGNOSES  Hypoxia. COPD exacerbation.    NEW MEDICATIONS STARTED DURING THIS VISIT:  New Prescriptions   No medications on file     Note:  This document was prepared  using Conservation officer, historic buildings and may include unintentional dictation errors.    Myrna Blazer, MD 05/04/17 [phone]

## 2017-05-04 NOTE — H&P (Signed)
Eric Lyons is an 51 y.o. male.   Chief Complaint: shortness of breath HPI: this is a 51 year old male has a history of COPD and chronic hypoxic respiratory failure and is on 2 L of oxygen at home. 2 days ago he ran his medications including his inhalers. He went to the pharmacy to have it refilled and his prescription was out they called his primary care doctor's office and he says they wouldn't authorize refill. Since then he is becoming more short of breath and wheezing and coughing. He comes in and is hypoxic on his usual 2 L is currently on 4 L nasal cannula. He is short of breath and tachypnea but is able to answer questions.  Past Medical History:  Diagnosis Date  . Asthma   . COPD (chronic obstructive pulmonary disease) ([place])     Past Surgical History:  Procedure Laterality Date  . ankle fracture surgery    . HERNIA REPAIR    . KNEE ARTHROSCOPY    . TONSILLECTOMY      Family History  Problem Relation Age of Onset  . Alcohol abuse Mother   . ALS Father    Social History:  reports that he has never smoked. He has never used smokeless tobacco. He reports that he does not drink alcohol or use drugs.  Allergies:  Allergies  Allergen Reactions  . Fentanyl Rash  . Prednisone Other (See Comments)    Irritable     (Not in a hospital admission)  Results for orders placed or performed during the hospital encounter of 05/04/17 (from the past 48 hour(s))  Basic metabolic panel     Status: Abnormal   Collection Time: 05/04/17  3:15 PM  Result Value Ref Range   Sodium 139 135 - 145 mmol/L   Potassium 3.7 3.5 - 5.1 mmol/L   Chloride 97 (L) 101 - 111 mmol/L   CO2 36 (H) 22 - 32 mmol/L   Glucose, Bld 129 (H) 65 - 99 mg/dL   BUN 14 6 - 20 mg/dL   Creatinine, Ser 0.81 0.61 - 1.24 mg/dL   Calcium 8.8 (L) 8.9 - 10.3 mg/dL   GFR calc non Af Amer >60 >60 mL/min   GFR calc Af Amer >60 >60 mL/min    Comment: (NOTE) The eGFR has been calculated using the CKD EPI equation. This  calculation has not been validated in all clinical situations. eGFR's persistently <60 mL/min signify possible Chronic Kidney Disease.    Anion gap 6 5 - 15  CBC     Status: None   Collection Time: 05/04/17  3:15 PM  Result Value Ref Range   WBC 8.4 3.8 - 10.6 K/uL   RBC 4.83 4.40 - 5.90 MIL/uL   Hemoglobin 14.7 13.0 - 18.0 g/dL   HCT 44.2 40.0 - 52.0 %   MCV 91.5 80.0 - 100.0 fL   MCH 30.3 26.0 - 34.0 pg   MCHC 33.1 32.0 - 36.0 g/dL   RDW 13.7 11.5 - 14.5 %   Platelets 246 150 - 440 K/uL  Troponin I     Status: None   Collection Time: 05/04/17  3:15 PM  Result Value Ref Range   Troponin I <0.03 <0.03 ng/mL   Dg Chest 2 View  Result Date: 05/04/2017 CLINICAL DATA:  Chest tightness and shortness of breath since yesterday. History of sleep apnea, COPD and asthma. EXAM: CHEST  2 VIEW COMPARISON:  01/10/2017 and 01/08/2017. FINDINGS: Lordotic positioning on the AP view. The heart size  and mediastinal contours are stable. Interval improved aeration of the lung bases. There is mild chronic central airway thickening without significant hyperinflation, pleural effusion or pneumothorax. The bones appear unchanged. IMPRESSION: No acute cardiopulmonary process. Chronic central airway thickening. Electronically Signed   By: Eric Lyons M.D.   On: 05/04/2017 16:05    Review of Systems  Constitutional: Negative for chills and fever.  HENT: Negative for hearing loss.   Eyes: Negative for blurred vision.  Respiratory: Positive for cough, shortness of breath and wheezing.   Cardiovascular: Negative for chest pain.  Gastrointestinal: Negative for nausea and vomiting.  Genitourinary: Negative for dysuria.  Musculoskeletal: Negative for joint pain.  Skin: Positive for rash.  Neurological: Negative for dizziness.    Blood pressure 139/76, pulse 95, temperature 99.4 F (37.4 C), temperature source Oral, resp. rate 20, height '5\' 5"'  (1.651 m), weight 111.1 kg (245 lb), SpO2 93 %. Physical Exam   Constitutional: He is oriented to person, place, and time.  Moderately obese male in moderate respiratory distress  HENT:  Head: Normocephalic.  Mouth/Throat: Oropharynx is clear and moist. No oropharyngeal exudate.  Eyes: EOM are normal. Pupils are equal, round, and reactive to light. Left eye exhibits no discharge. No scleral icterus.  Neck: Normal range of motion. Neck supple. No JVD present. No thyromegaly present.  Cardiovascular: Normal rate and regular rhythm.   No murmur heard. Respiratory:  Expiratory inspiratory wheezing. No dullness to percussion. Mild use of accessory muscles.  GI: Soft. Bowel sounds are normal.  Musculoskeletal: He exhibits no edema or tenderness.  Lymphadenopathy:    He has no cervical adenopathy.  Neurological: He is alert and oriented to person, place, and time. No cranial nerve deficit. Coordination normal.  Skin: Skin is warm and dry.     Assessment/Plan 1. Acute on chronic hypoxic respiratory failure.the patient is usually on 2 L nasal cannula of oxygen at home. Here he is requiring 4 L to maintain his oxygenation. He is very tachypnea. He does have a history of being intubated in the past. We'll continue oxygen support and treat underlying calls. Monitor closely for any deterioration. If work of breathing gets any harder will cnsider putting him on BiPAP.  2. COPD exacerbation. We'll go ahead and start IV steroids. Place him on nebulizer for frequent bronchodilator therapy.We'll need to make sure he has medications at discharge. He was to follow with Dr. Theda Sers from pulmonary as an outpatient but that appointment date has not come up yet.  Total critical care time spent 40 minutes Baxter Hire, MD 05/04/2017, 5:34 PM

## 2017-05-04 NOTE — ED Notes (Signed)
Pt still with snoring. ABG and BiPAP verbal order obtained from MD.

## 2017-05-05 DIAGNOSIS — J441 Chronic obstructive pulmonary disease with (acute) exacerbation: Secondary | ICD-10-CM

## 2017-05-05 LAB — BLOOD GAS, ARTERIAL
Acid-Base Excess: 8 mmol/L — ABNORMAL HIGH (ref 0.0–2.0)
Acid-Base Excess: 13 mmol/L — ABNORMAL HIGH (ref 0.0–2.0)
Bicarbonate: 39 mmol/L — ABNORMAL HIGH (ref 20.0–28.0)
Bicarbonate: 40.9 mmol/L — ABNORMAL HIGH (ref 20.0–28.0)
Delivery systems: POSITIVE
Delivery systems: POSITIVE
Expiratory PAP: 6
Expiratory PAP: 12
FIO2: 0.5
FIO2: 0.5
Inspiratory PAP: 12
Inspiratory PAP: 20
O2 Saturation: 85.7 %
O2 Saturation: 94.9 %
Patient temperature: 37
Patient temperature: 37
pCO2 arterial: 91 mmHg (ref 32.0–48.0)
pCO2 arterial: 66 mmHg (ref 32.0–48.0)
pH, Arterial: 7.24 — ABNORMAL LOW (ref 7.350–7.450)
pH, Arterial: 7.4 (ref 7.350–7.450)
pO2, Arterial: 60 mmHg — ABNORMAL LOW (ref 83.0–108.0)
pO2, Arterial: 75 mmHg — ABNORMAL LOW (ref 83.0–108.0)

## 2017-05-05 LAB — CBC
HCT: 47.3 % (ref 40.0–52.0)
Hemoglobin: 15.9 g/dL (ref 13.0–18.0)
MCH: 30.3 pg (ref 26.0–34.0)
MCHC: 33.6 g/dL (ref 32.0–36.0)
MCV: 90.4 fL (ref 80.0–100.0)
Platelets: 264 10*3/uL (ref 150–440)
RBC: 5.23 MIL/uL (ref 4.40–5.90)
RDW: 13.7 % (ref 11.5–14.5)
WBC: 7.2 10*3/uL (ref 3.8–10.6)

## 2017-05-05 LAB — BASIC METABOLIC PANEL
Anion gap: 9 (ref 5–15)
BUN: 13 mg/dL (ref 6–20)
CO2: 36 mmol/L — ABNORMAL HIGH (ref 22–32)
Calcium: 9.3 mg/dL (ref 8.9–10.3)
Chloride: 95 mmol/L — ABNORMAL LOW (ref 101–111)
Creatinine, Ser: 0.69 mg/dL (ref 0.61–1.24)
GFR calc Af Amer: >60 mL/min (ref >60)
GFR calc non Af Amer: >60 mL/min (ref >60)
Glucose, Bld: 210 mg/dL — ABNORMAL HIGH (ref 65–99)
Potassium: 4.4 mmol/L (ref 3.5–5.1)
Sodium: 140 mmol/L (ref 135–145)

## 2017-05-05 LAB — GLUCOSE, CAPILLARY: Glucose-Capillary: 154 mg/dL — ABNORMAL HIGH (ref 65–99)

## 2017-05-05 LAB — PHOSPHORUS: Phosphorus: 2.7 mg/dL (ref 2.5–4.6)

## 2017-05-05 LAB — MAGNESIUM: Magnesium: 1.9 mg/dL (ref 1.7–2.4)

## 2017-05-05 MED ORDER — IPRATROPIUM-ALBUTEROL 0.5-2.5 (3) MG/3ML IN SOLN
3.0000 mL | RESPIRATORY_TRACT | Status: DC
  Administered 2017-05-05 – 2017-05-06 (×6): 3 mL via RESPIRATORY_TRACT
  Filled 2017-05-05 (×6): qty 3

## 2017-05-05 MED ORDER — METHYLPREDNISOLONE SODIUM SUCC 40 MG IJ SOLR
40.0000 mg | Freq: Two times a day (BID) | INTRAMUSCULAR | Status: DC
  Administered 2017-05-05 – 2017-05-07 (×4): 40 mg via INTRAVENOUS
  Filled 2017-05-05 (×4): qty 1

## 2017-05-05 MED ORDER — FLUTICASONE PROPIONATE 50 MCG/ACT NA SUSP
2.0000 | Freq: Every day | NASAL | Status: DC
  Administered 2017-05-05 – 2017-05-07 (×3): 2 via NASAL
  Filled 2017-05-05: qty 16

## 2017-05-05 MED ORDER — INSULIN ASPART 100 UNIT/ML ~~LOC~~ SOLN
0.0000 [IU] | Freq: Three times a day (TID) | SUBCUTANEOUS | Status: DC
  Administered 2017-05-06: 7 [IU] via SUBCUTANEOUS
  Administered 2017-05-06 – 2017-05-07 (×2): 2 [IU] via SUBCUTANEOUS
  Administered 2017-05-07: 3 [IU] via SUBCUTANEOUS
  Filled 2017-05-05: qty 2
  Filled 2017-05-05: qty 7
  Filled 2017-05-05 (×2): qty 3

## 2017-05-05 MED ORDER — INSULIN ASPART 100 UNIT/ML ~~LOC~~ SOLN
0.0000 [IU] | Freq: Every day | SUBCUTANEOUS | Status: DC
  Administered 2017-05-06: 2 [IU] via SUBCUTANEOUS
  Filled 2017-05-05: qty 2

## 2017-05-05 NOTE — Care Management Note (Addendum)
Case Management Note  Patient Details  Name: Eric Lyons MRN: [id number] Date of Birth: 06/09/1966  Subjective/Objective:                  Met with patient to discuss discharge planning. He is from home alone and states he relies on a friend for transportation.  He states this is not a problem.  His PCP is with [facility]. He had a sleep study in April pending CPAP/BiPAP. [facility]ct[facility] O[facility]. He has a walker however he requests a rollator which will need to be cleared by PT evaluation. RNCM consult received for medication assistance. Patient has Medicare drug coverage however some medications (inhalers) are still quite expensive. Uses [facility] for medications.   Action/Plan: Home health list left with patient. Rx drug discount drug card explained/delivered. Advised patient to go online and apply for Medicaid services to see if they can assist him. RNCM watch for chronic O2 needs and PT recommendation for rollator.   Expected Discharge Date:                  Expected Discharge Plan:     In-House Referral:     Discharge planning Services  CM Consult  Post Acute Care Choice:  Home Health, Durable Medical Equipment Choice offered to:  Patient  DME Arranged:  Walker rolling with seat DME Agency:     HH Arranged:    [place] Agency:     Status of Service:  In process, will continue to follow  If discussed at Long Length of Stay Meetings, dates discussed:    Additional Comments:  Marshell Garfinkel, RN 05/05/2017, 11:00 AM

## 2017-05-05 NOTE — Progress Notes (Signed)
[facility] [place] at [facility]   PATIENT NAME: Eric GandyWilliam Lyons    MR#:  [id number]  DATE OF BIRTH:  04/20/1966  SUBJECTIVE:  Patient feeling better  Off of BIPAP Ran ut of ADVAIR as well as had some plastic burning fumes that triggered his COPD  REVIEW OF SYSTEMS:    Review of Systems  Constitutional: Negative for fever, chills weight loss HENT: Negative for ear pain, nosebleeds, congestion, facial swelling, rhinorrhea, neck pain, neck stiffness and ear discharge.   Respiratory: ++ coough, shortness of breath, wheezing  Cardiovascular: Negative for chest pain, palpitations and leg swelling.  Gastrointestinal: Negative for heartburn, abdominal pain, vomiting, diarrhea or consitpation Genitourinary: Negative for dysuria, urgency, frequency, hematuria Musculoskeletal: Negative for back pain or joint pain Neurological: Negative for dizziness, seizures, syncope, focal weakness,  numbness and headaches.  Hematological: Does not bruise/bleed easily.  Psychiatric/Behavioral: Negative for hallucinations, confusion, dysphoric mood    Tolerating Diet:yes      DRUG ALLERGIES:   Allergies  Allergen Reactions  . Fentanyl Rash  . Prednisone Other (See Comments)    Irritable    VITALS:  Blood pressure 128/79, pulse (!) 102, temperature 98.4 F (36.9 C), temperature source Oral, resp. rate 17, height 5\' 5"  (1.651 m), weight 111.1 kg (245 lb), SpO2 91 %.  PHYSICAL EXAMINATION:   Constitutional: Appears well-developed and obese No distress. HENT: Normocephalic. Marland Kitchen. Oropharynx is clear and moist.  Eyes: Conjunctivae and EOM are normal. PERRLA, no scleral icterus.  Neck: Normal ROM. Neck supple. No JVD. No tracheal deviation. CVS: RRR, S1/S2 +, no murmurs, no gallops, no carotid bruit.  Pulmonary: Effort and breath sounds normal, no stridor, rhonchi, wheezes, rales.  Abdominal: Soft. BS +,  no distension, tenderness, rebound or guarding.  Musculoskeletal: Normal  range of motion. No edema and no tenderness.  Neuro: Alert. CN 2-12 grossly intact. No focal deficits. Skin: Skin is warm and dry. No rash noted. Psychiatric: Normal mood and affect.      LABORATORY PANEL:   CBC  Recent Labs Lab 05/05/17 0329  WBC 7.2  HGB 15.9  HCT 47.3  PLT 264   ------------------------------------------------------------------------------------------------------------------  Chemistries   Recent Labs Lab 05/05/17 0329  NA 140  K 4.4  CL 95*  CO2 36*  GLUCOSE 210*  BUN 13  CREATININE 0.69  CALCIUM 9.3  MG 1.9   ------------------------------------------------------------------------------------------------------------------  Cardiac Enzymes  Recent Labs Lab 05/04/17 1515  TROPONINI <0.03   ------------------------------------------------------------------------------------------------------------------  RADIOLOGY:  Dg Chest 2 View  Result Date: 05/04/2017 CLINICAL DATA:  Chest tightness and shortness of breath since yesterday. History of sleep apnea, COPD and asthma. EXAM: CHEST  2 VIEW COMPARISON:  01/10/2017 and 01/08/2017. FINDINGS: Lordotic positioning on the AP view. The heart size and mediastinal contours are stable. Interval improved aeration of the lung bases. There is mild chronic central airway thickening without significant hyperinflation, pleural effusion or pneumothorax. The bones appear unchanged. IMPRESSION: No acute cardiopulmonary process. Chronic central airway thickening. Electronically Signed   By: Carey BullocksWilliam  Veazey M.D.   On: 05/04/2017 16:05     ASSESSMENT AND PLAN:   51 y/o male with COPD/OSA here with acute hypoxic resp failure.  1. Acute on chronic hypoxic respiratory failure: He is off on BIPAP currently on HFNC He is on  2 L nasal cannula of oxygen at home.  Wean O2 to baseline  2. COPD exacerbation. Continue IV steroids, nebs and inhalers  3. OSA: Needs Bipap at night   4. Obesity: encourage  weight loss  as tolerated  Management plans discussed with the patient and he is in agreement.  CODE STATUS: full  TOTAL TIME TAKING CARE OF THIS PATIENT: 30 minutes.     POSSIBLE D/C  2-3 days, DEPENDING ON CLINICAL CONDITION.   Jarome Trull M.D on 05/05/2017 at 10:13 AM  Between 7am to 6pm - Pager - [phone] After 6pm go to [email or web address] - Social research officer, government  [facility] [place] [facility]  Office  [phone]  CC: Primary care physician; White, Arlyss Repress, NP  Note: This dictation was prepared with Dragon dictation along with smaller phrase technology. Any transcriptional errors that result from this process are unintentional.

## 2017-05-05 NOTE — Progress Notes (Signed)
Inpatient Diabetes Program Recommendations  AACE/ADA: New Consensus Statement on Inpatient Glycemic Control (2015)  Target Ranges:  Prepandial:   less than 140 mg/dL      Peak postprandial:   less than 180 mg/dL (1-2 hours)      Critically ill patients:  140 - 180 mg/dL   Results for Eric Lyons, Eric Lyons (MRN [id number]) as of 05/05/2017 08:20  Ref. Range 05/05/2017 03:29  Glucose Latest Ref Range: 65 - 99 mg/dL 409210 (H)     MD- Note patient admitted with SOB/ COPD.  No History of DM noted.  Note that patient received 10 mg Decadron X 1 dose yesterday at 4pm and now getting Solumedrol 60 mg Q6 hours.  Elevated lab glucose this AM.  Please consider placing orders for CBG checks and Novolog Sensitive Correction Scale/ SSI (0-9 units) TID AC + HS while patient getting steroids.      --Will follow patient during hospitalization--  Ambrose FinlandJeannine Johnston Sammuel Blick RN, MSN, CDE Diabetes Coordinator Inpatient Glycemic Control Team Team Pager: [phone] (8a-5p)

## 2017-05-06 LAB — GLUCOSE, CAPILLARY
Glucose-Capillary: 188 mg/dL — ABNORMAL HIGH (ref 65–99)
Glucose-Capillary: 160 mg/dL — ABNORMAL HIGH (ref 65–99)
Glucose-Capillary: 324 mg/dL — ABNORMAL HIGH (ref 65–99)
Glucose-Capillary: 214 mg/dL — ABNORMAL HIGH (ref 65–99)

## 2017-05-06 LAB — HIV ANTIBODY (ROUTINE TESTING W REFLEX): HIV Screen 4th Generation wRfx: NONREACTIVE

## 2017-05-06 MED ORDER — IPRATROPIUM-ALBUTEROL 0.5-2.5 (3) MG/3ML IN SOLN
3.0000 mL | Freq: Four times a day (QID) | RESPIRATORY_TRACT | Status: DC
  Administered 2017-05-06 (×2): 3 mL via RESPIRATORY_TRACT

## 2017-05-06 MED ORDER — OXYCODONE HCL 5 MG PO TABS
5.0000 mg | ORAL_TABLET | Freq: Once | ORAL | Status: AC
  Administered 2017-05-06: 5 mg via ORAL
  Filled 2017-05-06: qty 1

## 2017-05-06 MED ORDER — TIOTROPIUM BROMIDE MONOHYDRATE 18 MCG IN CAPS
18.0000 ug | ORAL_CAPSULE | Freq: Every day | RESPIRATORY_TRACT | Status: DC
  Administered 2017-05-06 – 2017-05-07 (×2): 18 ug via RESPIRATORY_TRACT
  Filled 2017-05-06: qty 5

## 2017-05-06 MED ORDER — IPRATROPIUM-ALBUTEROL 0.5-2.5 (3) MG/3ML IN SOLN
RESPIRATORY_TRACT | Status: AC
  Filled 2017-05-06: qty 3

## 2017-05-06 MED ORDER — IPRATROPIUM-ALBUTEROL 0.5-2.5 (3) MG/3ML IN SOLN
3.0000 mL | Freq: Four times a day (QID) | RESPIRATORY_TRACT | Status: DC
  Administered 2017-05-06 – 2017-05-07 (×4): 3 mL via RESPIRATORY_TRACT
  Filled 2017-05-06 (×3): qty 3

## 2017-05-06 MED ORDER — MOMETASONE FURO-FORMOTEROL FUM 100-5 MCG/ACT IN AERO
2.0000 | INHALATION_SPRAY | Freq: Two times a day (BID) | RESPIRATORY_TRACT | Status: DC
  Administered 2017-05-06 – 2017-05-07 (×3): 2 via RESPIRATORY_TRACT
  Filled 2017-05-06: qty 8.8

## 2017-05-06 NOTE — Progress Notes (Signed)
[facility] - [place] at [facility]   PATIENT NAME: Eric Lyons    MR#:  [id number]  DATE OF BIRTH:  10/01/1966  SUBJECTIVE:  Patient on BiPAP at night. He is back on high flow nasal cannula which is being weaned Cough and shortness of breath is improving  REVIEW OF SYSTEMS:    Review of Systems  Constitutional: Negative for fever, chills weight loss HENT: Negative for ear pain, nosebleeds, congestion, facial swelling, rhinorrhea, neck pain, neck stiffness and ear discharge.   Respiratory: ++ coough, shortness of breath, wheezing (Improving) Cardiovascular: Negative for chest pain, palpitations and leg swelling.  Gastrointestinal: Negative for heartburn, abdominal pain, vomiting, diarrhea or consitpation Genitourinary: Negative for dysuria, urgency, frequency, hematuria Musculoskeletal: Negative for back pain or joint pain Neurological: Negative for dizziness, seizures, syncope, focal weakness,  numbness and headaches.  Hematological: Does not bruise/bleed easily.  Psychiatric/Behavioral: Negative for hallucinations, confusion, dysphoric mood    Tolerating Diet:yes      DRUG ALLERGIES:   Allergies  Allergen Reactions  . Fentanyl Rash  . Prednisone Other (See Comments)    Irritable    VITALS:  Blood pressure (!) 153/89, pulse 82, temperature 97.8 F (36.6 C), temperature source Axillary, resp. rate 12, height 5\' 5"  (1.651 m), weight 111.1 kg (245 lb), SpO2 93 %.  PHYSICAL EXAMINATION:   Constitutional: Appears well-developed and obese No distress. HENT: Normocephalic. Marland Kitchen Oropharynx is clear and moist.  Eyes: Conjunctivae and EOM are normal. PERRLA, no scleral icterus.  Neck: Normal ROM. Neck supple. No JVD. No tracheal deviation. CVS: RRR, S1/S2 +, no murmurs, no gallops, no carotid bruit.  Pulmonary: Effort and breath sounds normal, no stridor, rhonchi,  He has Some scattered wheezing wheezes, no rales.  Abdominal: Soft. BS +,  no distension,  tenderness, rebound or guarding.  Musculoskeletal: Normal range of motion. No edema and no tenderness.  Neuro: Alert. CN 2-12 grossly intact. No focal deficits. Skin: Skin is warm and dry. No rash noted. Psychiatric: Normal mood and affect.      LABORATORY PANEL:   CBC  Recent Labs Lab 05/05/17 0329  WBC 7.2  HGB 15.9  HCT 47.3  PLT 264   ------------------------------------------------------------------------------------------------------------------  Chemistries   Recent Labs Lab 05/05/17 0329  NA 140  K 4.4  CL 95*  CO2 36*  GLUCOSE 210*  BUN 13  CREATININE 0.69  CALCIUM 9.3  MG 1.9   ------------------------------------------------------------------------------------------------------------------  Cardiac Enzymes  Recent Labs Lab 05/04/17 1515  TROPONINI <0.03   ------------------------------------------------------------------------------------------------------------------  RADIOLOGY:  Dg Chest 2 View  Result Date: 05/04/2017 CLINICAL DATA:  Chest tightness and shortness of breath since yesterday. History of sleep apnea, COPD and asthma. EXAM: CHEST  2 VIEW COMPARISON:  01/10/2017 and 01/08/2017. FINDINGS: Lordotic positioning on the AP view. The heart size and mediastinal contours are stable. Interval improved aeration of the lung bases. There is mild chronic central airway thickening without significant hyperinflation, pleural effusion or pneumothorax. The bones appear unchanged. IMPRESSION: No acute cardiopulmonary process. Chronic central airway thickening. Electronically Signed   By: Carey Bullocks M.D.   On: 05/04/2017 16:05     ASSESSMENT AND PLAN:   51 y/o male with COPD/OSA here with acute hypoxic resp failure.  1. Acute on chronic hypoxic respiratory failure: He is off on BIPAP currently on HFNC He is on  2 L nasal cannula of oxygen at home.  Wean O2 to baselineAs tolerated  2. COPD exacerbation. Continue IV steroids, nebs and  inhalers  3.  OSA: Needs Bipap at night   4. Obesity: encourage weight loss as tolerated  5. Elevated blood sugars due to steroids Continue sensitive sliding scale  Needs PT evaluation for disposition  Management plans discussed with the patient and he is in agreement.  CODE STATUS: full  TOTAL TIME TAKING CARE OF THIS PATIENT: 24 minutes.     POSSIBLE D/C  2-3 days, DEPENDING ON CLINICAL CONDITION.   Latania Bascomb M.D on 05/06/2017 at 9:23 AM  Between 7am to 6pm - Pager - [phone] After 6pm go to [email or web address] - Social research officer, governmentpassword EPAS [facility]  [facility] [place] [facility]  Office  [phone]  CC: Primary care physician; White, [name], NP  Note: This dictation was prepared with Dragon dictation along with smaller phrase technology. Any transcriptional errors that result from this process are unintentional.

## 2017-05-06 NOTE — Consult Note (Signed)
PULMONARY / CRITICAL CARE MEDICINE   Name: Eric Lyons MRN: [id number] DOB: 04-06-1966    ADMISSION DATE:  05/04/2017   CONSULTATION DATE:  05/04/2017  REFERRING MD:  Dr. Letitia LibraJohnston  REASON FOR CONSULT: ICU management of acute on chronic COPD exacerbation  CHIEF COMPLAINT:  dyspnea  HISTORY OF PRESENT ILLNESS:   This is a 51 y/o male with a h/o OSA, Asthma and COPD who presented with acute dyspnea and wheezing x 2 days. Associated symptoms include chest pressure which he isn't able to further characterize. Denies productive cough, fever, chills and myalgia. He is on home O2 and states that he ran out of his inhalers. He uses O2 at home (2L [place]). He is suppose to have a CPAP machine at home but states that it hasn't been delivered.  SUBJECTIVE Feels better today, on biPAP last night On fio2 40% today Alert and awake NAD  REVIEW OF SYSTEMS:   Constitutional: Negative for fever and chills.  HENT: Negative for congestion and rhinorrhea.  Eyes: Negative for redness and visual disturbance.  Respiratory: Positive for shortness of breath and wheezing.  Cardiovascular: Negative for chest pain and palpitations.  Musculoskeletal:Positive for back pain     VITAL SIGNS: BP (!) 153/89   Pulse 82   Temp 97.8 F (36.6 C) (Axillary)   Resp 12   Ht 5\' 5"  (1.651 m)   Wt 245 lb (111.1 kg)   SpO2 93%   BMI 40.77 kg/m   HEMODYNAMICS:    VENTILATOR SETTINGS: FiO2 (%):  [40 %-56 %] 40 %  INTAKE / OUTPUT: I/O last 3 completed shifts: In: 1600 [P.O.:1600] Out: 2075 [Urine:2075]  PHYSICAL EXAMINATION: General:  NAD Neuro:  AAO X4, no deficits HEENT: PERRLA, oral mucosa pink Cardiovascular: RRR, S1/S2, no MRG Lungs:  Normal WOB, bilateral breath sounds, mild expiratory wheezes Abdomen:  Soft, NT/ND, normal bowel sounds Musculoskeletal:  No deformities Skin:  Warm and dry  LABS:  BMET  Recent Labs Lab 05/04/17 1515 05/05/17 0329  NA 139 140  K 3.7 4.4  CL 97* 95*  CO2  36* 36*  BUN 14 13  CREATININE 0.81 0.69  GLUCOSE 129* 210*       ANTIBIOTICS: Azithromycin  SIGNIFICANT EVENTS: 5/6>admitted  LINES/TUBES: PIVs  DISCUSSION: 51 y/o male presenting with acute respiratory failure and acute COPD exacerbation due to medication non-adherence  ASSESSMENT  Acute hypercarbic/hypoxic respiratory failure Acute on chronic COPD exacerbation  PLAN BiPAP at night and titrate to oxygen to [place] as tolerated Nebulized steroids and bronchodilator Azithromycin IV solu-medrol  OK to transfer to gen med floor    Eric Lyons, M.D.  [facility]  Medical Director [facility] Medical Director [facility]

## 2017-05-06 NOTE — Progress Notes (Signed)
Patient arrived to the unit at this time. Alert and oriented and able to verbalize needs. No complaints of pain at this time. Will continue to monitor.
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
## 2017-05-07 LAB — GLUCOSE, CAPILLARY
Glucose-Capillary: 154 mg/dL — ABNORMAL HIGH (ref 65–99)
Glucose-Capillary: 247 mg/dL — ABNORMAL HIGH (ref 65–99)

## 2017-05-07 MED ORDER — PREDNISONE 10 MG PO TABS: 10.0000 mg | ORAL_TABLET | Freq: Every day | ORAL | 0 refills | Status: DC

## 2017-05-07 NOTE — Care Management Important Message (Signed)
Important Message  Patient Details  Name: Shaune PollackWilliam F Mctague MRN: [id number] Date of Birth: 1966/01/27   Medicare Important Message Given:  Yes    Marily MemosLisa M Rayan Ines, RN 05/07/2017, 1:50 PM

## 2017-05-07 NOTE — Care Management Note (Addendum)
Case Management Note  Patient Details  Name: Shaune PollackWilliam F Pfost MRN: [id number] Date of Birth: December 17, 1966  Subjective/Objective:  Per [facility] patient is on continuous home O2.  Patient would benefit from a noninvasive ventilator due to acute on chronic respiratory failure secondary to COPD. BiPAP would not be beneficial to the patient. Due to the PCO2 levels still elevated while on BiPAP.                 Action/Plan: Spoke with attending and explained non invasive benenfits. She is agreeable. [facility] to speak with patient   Expected Discharge Date:  05/07/17               Expected Discharge Plan:  Home w Home Health Services  In-House Referral:     Discharge planning Services  CM Consult  Post Acute Care Choice:  Home Health, Durable Medical Equipment Choice offered to:  Patient  DME Arranged:  Walker rolling with seat DME Agency:  [facility]  [facility]     Status of Service:  Completed, signed off  If discussed at Long Length of Stay Meetings, dates discussed:    Additional Comments:  Marily MemosLisa M Koleson Reifsteck, RN 05/07/2017, 1:42 PM

## 2017-05-07 NOTE — Progress Notes (Signed)
Discussed discharge instructions and medications with pt. IV removed. All questions addressed. Pt switched over to portable O2 tank from home and transported home via car by his friend.  Orson Apeanielle Gem Conkle, RN

## 2017-05-07 NOTE — Discharge Summary (Addendum)
[facility] [place] [facility]   PATIENT NAME: Eric Lyons    MR#:  [id number]  DATE OF BIRTH:  [birth year]  DATE OF ADMISSION:  05/04/2017 ADMITTING PHYSICIAN: Gracelyn NurseJohn D Johnston, MD  DATE OF DISCHARGE: 05/07/2017  PRIMARY CARE PHYSICIAN: Titus MouldWhite, Elizabeth Burney, NP    ADMISSION DIAGNOSIS:  Hypoxia [R09.02] COPD exacerbation (HCC) [J44.1]  DISCHARGE DIAGNOSIS:  Active Problems:   Acute on chronic respiratory failure (HCC)   SECONDARY DIAGNOSIS:   Past Medical History:  Diagnosis Date  . Asthma   . COPD (chronic obstructive pulmonary disease) [facility](HCC)     HOSPITAL COURSE:   51 y/o male with COPD/OSA here with acute hypoxic resp failure.  1. Acute on chronic hypoxic respiratory failure: He was admitted using on BIPAP which was weaned to  HFNC then to [place] He will be discharged on 3 l O2 which he uses at home (2-4l).   2. COPD exacerbation. He will  continue po taper of steroids, nebs and inhalers  3. OSA: Per [facility] patient is on continuous home O2.  Patient would benefit from a noninvasive ventilator due to acute on chronic respiratory failure secondary to COPD. BiPAP would not be beneficial to the patient. Due to the PCO2 levels still elevated while on BiPAP.                4. Obesity: encouraged weight loss as tolerated  5. Elevated blood sugars due to steroids he will need outpatient follow up.   DISCHARGE CONDITIONS AND DIET:   Stable Regular diet  CONSULTS OBTAINED:    DRUG ALLERGIES:   Allergies  Allergen Reactions  . Fentanyl Rash  . Prednisone Other (See Comments)    Irritable    DISCHARGE MEDICATIONS:   Current Discharge Medication List    START taking these medications   Details  predniSONE (DELTASONE) 10 MG tablet Take 1 tablet (10 mg total) by mouth daily with breakfast. 60 mg PO (ORAL) x 2 days 50 mg PO (ORAL)  x 2 days 40 mg PO (ORAL)  x 2 days 30 mg PO  (ORAL)  x 2 days 20 mg PO  (ORAL) x 2 days 10 mg PO   (ORAL) x 2 days then stop Qty: 42 tablet, Refills: 0      CONTINUE these medications which have NOT CHANGED   Details  albuterol (PROVENTIL HFA;VENTOLIN HFA) 108 (90 Base) MCG/ACT inhaler Inhale 2 puffs into the lungs every 6 (six) hours as needed for wheezing or shortness of breath. Qty: 1 Inhaler, Refills: 2    fluticasone (FLONASE) 50 MCG/ACT nasal spray Place 2 sprays into both nostrils daily. Qty: 16 g, Refills: 1    Ipratropium-Albuterol (COMBIVENT) 20-100 MCG/ACT AERS respimat Inhale 2 puffs into the lungs 4 (four) times daily. This is first line rescue medication Qty: 1 Inhaler, Refills: 10    ipratropium-albuterol (DUONEB) 0.5-2.5 (3) MG/3ML SOLN Take 3 mLs by nebulization every 6 (six) hours. DX: COPD DX Code: J44.9 Qty: 360 mL, Refills: 10    ADVAIR DISKUS 500-50 MCG/DOSE AEPB inhale 1 dose by mouth every 12 hours Qty: 60 each, Refills: 1      STOP taking these medications     Fluticasone-Salmeterol (ADVAIR DISKUS) 250-50 MCG/DOSE AEPB           Today   CHIEF COMPLAINT:  Doing well feels at baseline Ambulated with Pt several times   VITAL SIGNS:  Blood pressure 135/89, pulse 92, temperature 98.1 F (36.7 C), temperature source Oral,  resp. rate 18, height 5\' 5"  (1.651 m), weight 111.1 kg (245 lb), SpO2 96 %.   REVIEW OF SYSTEMS:  Review of Systems  Constitutional: Negative.  Negative for chills, fever and malaise/fatigue.  HENT: Negative.  Negative for ear discharge, ear pain, hearing loss, nosebleeds and sore throat.   Eyes: Negative.  Negative for blurred vision and pain.  Respiratory: Negative.  Negative for cough, hemoptysis, shortness of breath and wheezing.   Cardiovascular: Negative.  Negative for chest pain, palpitations and leg swelling.  Gastrointestinal: Negative.  Negative for abdominal pain, blood in stool, diarrhea, nausea and vomiting.  Genitourinary: Negative.  Negative for dysuria.  Musculoskeletal: Negative.  Negative for back pain.   Skin: Negative.   Neurological: Positive for headaches. Negative for dizziness, tremors, speech change, focal weakness and seizures.  Endo/Heme/Allergies: Negative.  Does not bruise/bleed easily.  Psychiatric/Behavioral: Negative.  Negative for depression, hallucinations and suicidal ideas.     PHYSICAL EXAMINATION:  GENERAL:  51 y.o.-year-old patient lying in the bed with no acute distress. obese NECK:  Supple, no jugular venous distention. No thyroid enlargement, no tenderness.  LUNGS: Normal breath sounds bilaterally, no wheezing, rales,rhonchi  No use of accessory muscles of respiration.  CARDIOVASCULAR: S1, S2 normal. No murmurs, rubs, or gallops.  ABDOMEN: Soft, non-tender, non-distended. Bowel sounds present. No organomegaly or mass.  EXTREMITIES: No pedal edema, cyanosis, or clubbing.  PSYCHIATRIC: The patient is alert and oriented x 3.  SKIN: No obvious rash, lesion, or ulcer.   DATA REVIEW:   CBC  Recent Labs Lab 05/05/17 0329  WBC 7.2  HGB 15.9  HCT 47.3  PLT 264    Chemistries   Recent Labs Lab 05/05/17 0329  NA 140  K 4.4  CL 95*  CO2 36*  GLUCOSE 210*  BUN 13  CREATININE 0.69  CALCIUM 9.3  MG 1.9    Cardiac Enzymes  Recent Labs Lab 05/04/17 1515  TROPONINI <0.03    Microbiology Results  @MICRORSLT48 @  RADIOLOGY:  No results found.    Current Discharge Medication List    START taking these medications   Details  predniSONE (DELTASONE) 10 MG tablet Take 1 tablet (10 mg total) by mouth daily with breakfast. 60 mg PO (ORAL) x 2 days 50 mg PO (ORAL)  x 2 days 40 mg PO (ORAL)  x 2 days 30 mg PO  (ORAL)  x 2 days 20 mg PO  (ORAL) x 2 days 10 mg PO  (ORAL) x 2 days then stop Qty: 42 tablet, Refills: 0      CONTINUE these medications which have NOT CHANGED   Details  albuterol (PROVENTIL HFA;VENTOLIN HFA) 108 (90 Base) MCG/ACT inhaler Inhale 2 puffs into the lungs every 6 (six) hours as needed for wheezing or shortness of  breath. Qty: 1 Inhaler, Refills: 2    fluticasone (FLONASE) 50 MCG/ACT nasal spray Place 2 sprays into both nostrils daily. Qty: 16 g, Refills: 1    Ipratropium-Albuterol (COMBIVENT) 20-100 MCG/ACT AERS respimat Inhale 2 puffs into the lungs 4 (four) times daily. This is first line rescue medication Qty: 1 Inhaler, Refills: 10    ipratropium-albuterol (DUONEB) 0.5-2.5 (3) MG/3ML SOLN Take 3 mLs by nebulization every 6 (six) hours. DX: COPD DX Code: J44.9 Qty: 360 mL, Refills: 10    ADVAIR DISKUS 500-50 MCG/DOSE AEPB inhale 1 dose by mouth every 12 hours Qty: 60 each, Refills: 1      STOP taking these medications     Fluticasone-Salmeterol (ADVAIR DISKUS) 250-50  MCG/DOSE AEPB            Management plans discussed with the patient and he is in agreement. Stable for discharge home  Patient should follow up with pcp  CODE STATUS:     Code Status Orders        Start     Ordered   05/04/17 2035  Full code  Continuous     05/04/17 2035    Code Status History    Date Active Date Inactive Code Status Order ID Comments User Context   12/31/2016  5:36 PM 01/13/2017  9:52 PM Full Code [id number]  Nelda Bucks, MD ED   12/06/2016  8:23 PM 12/08/2016  5:15 PM Full Code [id number]  Houston Siren, MD Inpatient   11/13/2015  2:58 PM 11/15/2015  5:46 PM Full Code [id number]  Shaune Pollack, MD Inpatient      TOTAL TIME TAKING CARE OF THIS PATIENT: 37 minutes.    Note: This dictation was prepared with Dragon dictation along with smaller phrase technology. Any transcriptional errors that result from this process are unintentional.  Yovan Leeman M.D on 05/07/2017 at 9:09 AM  Between 7am to 6pm - Pager - [phone] After 6pm go to [email or web address] - Social research officer, government  [facility]  Office  [phone]  CC: Primary care physician; Titus Mould, NP

## 2017-05-09 ENCOUNTER — Other Ambulatory Visit: Payer: Self-pay | Admitting: *Deleted

## 2017-05-09 DIAGNOSIS — G4733 Obstructive sleep apnea (adult) (pediatric): Secondary | ICD-10-CM

## 2017-05-27 ENCOUNTER — Ambulatory Visit: Payer: Medicare Other | Admitting: Pulmonary Disease

## 2017-05-27 ENCOUNTER — Encounter: Payer: Self-pay | Admitting: *Deleted

## 2017-05-27 DIAGNOSIS — F419 Anxiety disorder, unspecified: Secondary | ICD-10-CM | POA: Insufficient documentation

## 2017-05-28 ENCOUNTER — Encounter: Payer: Self-pay | Admitting: Pulmonary Disease

## 2017-06-21 ENCOUNTER — Encounter: Payer: Self-pay | Admitting: Pulmonary Disease

## 2017-07-03 ENCOUNTER — Other Ambulatory Visit: Payer: Self-pay | Admitting: Pulmonary Disease

## 2017-07-16 ENCOUNTER — Other Ambulatory Visit: Payer: Self-pay | Admitting: Pulmonary Disease

## 2017-07-21 ENCOUNTER — Ambulatory Visit (INDEPENDENT_AMBULATORY_CARE_PROVIDER_SITE_OTHER): Payer: Medicare Other | Admitting: Pulmonary Disease

## 2017-07-21 ENCOUNTER — Encounter: Payer: Self-pay | Admitting: Pulmonary Disease

## 2017-07-21 DIAGNOSIS — J449 Chronic obstructive pulmonary disease, unspecified: Secondary | ICD-10-CM

## 2017-07-21 DIAGNOSIS — J9612 Chronic respiratory failure with hypercapnia: Secondary | ICD-10-CM

## 2017-07-21 DIAGNOSIS — E662 Morbid (severe) obesity with alveolar hypoventilation: Secondary | ICD-10-CM

## 2017-07-21 DIAGNOSIS — G4733 Obstructive sleep apnea (adult) (pediatric): Secondary | ICD-10-CM | POA: Diagnosis not present

## 2017-07-21 MED ORDER — FLUTICASONE PROPIONATE 50 MCG/ACT NA SUSP: NASAL | 3 refills | Status: DC

## 2017-07-21 NOTE — Patient Instructions (Addendum)
Continue BiPAP with oxygen bleed-in during sleep  We will try to arrange for a portable oxygen concentrator to be used during the day  Continue Advair, albuterol as needed, DuoNeb as needed  Continue efforts at weight loss with exercise and dietary changes as we discussed  Follow-up in 3-4 months
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
## 2017-08-01 ENCOUNTER — Telehealth: Payer: Self-pay | Admitting: Pulmonary Disease
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
## 2017-08-19 ENCOUNTER — Encounter: Payer: Self-pay | Admitting: *Deleted

## 2017-08-19 NOTE — Telephone Encounter (Signed)
Given information for letter for pt. Will call pt once written and signed. Nothing further needed.

## 2017-09-29 ENCOUNTER — Other Ambulatory Visit: Payer: Self-pay | Admitting: Pulmonary Disease

## 2017-09-29 ENCOUNTER — Ambulatory Visit (INDEPENDENT_AMBULATORY_CARE_PROVIDER_SITE_OTHER): Payer: Medicare Other | Admitting: Internal Medicine

## 2017-09-29 ENCOUNTER — Telehealth: Payer: Self-pay | Admitting: Pulmonary Disease

## 2017-09-29 ENCOUNTER — Encounter: Payer: Self-pay | Admitting: Internal Medicine

## 2017-09-29 VITALS — BP 140/100 | HR 101 | Resp 18 | Ht 65.0 in | Wt 264.0 lb

## 2017-09-29 DIAGNOSIS — J441 Chronic obstructive pulmonary disease with (acute) exacerbation: Secondary | ICD-10-CM

## 2017-09-29 MED ORDER — PREDNISONE 10 MG (21) PO TBPK: ORAL_TABLET | ORAL | 0 refills | Status: DC
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
## 2017-09-29 NOTE — Patient Instructions (Signed)
Prednisone taper.  Use nebulizer 4 x daily until you feel better.

## 2017-09-29 NOTE — Telephone Encounter (Signed)
Pt calling stating he has a head cold and it has gone down to his lungs   Not sure what to do  Would like some advise or if we may call in something to help Please advise

## 2017-09-29 NOTE — Telephone Encounter (Signed)
Left message patient needs to be evaluated. He can see DR for acute visit.

## 2017-09-29 NOTE — Telephone Encounter (Signed)
Pt returned call and was informed appt needed by scheduler.
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
## 2018-02-20 ENCOUNTER — Ambulatory Visit: Payer: Medicare Other | Admitting: Pulmonary Disease

## 2018-02-23 ENCOUNTER — Emergency Department
Admission: EM | Admit: 2018-02-23 | Discharge: 2018-02-23 | Disposition: A | Discharge status: Home / Self Care | Payer: Medicare Other | Attending: Emergency Medicine | Admitting: Emergency Medicine

## 2018-02-23 ENCOUNTER — Encounter: Payer: Self-pay | Admitting: Emergency Medicine

## 2018-02-23 ENCOUNTER — Emergency Department: Payer: Medicare Other

## 2018-02-23 ENCOUNTER — Other Ambulatory Visit: Payer: Self-pay

## 2018-02-23 DIAGNOSIS — J449 Chronic obstructive pulmonary disease, unspecified: Secondary | ICD-10-CM | POA: Diagnosis not present

## 2018-02-23 DIAGNOSIS — G5602 Carpal tunnel syndrome, left upper limb: Secondary | ICD-10-CM | POA: Insufficient documentation

## 2018-02-23 DIAGNOSIS — S60212A Contusion of left wrist, initial encounter: Secondary | ICD-10-CM | POA: Insufficient documentation

## 2018-02-23 DIAGNOSIS — Y939 Activity, unspecified: Secondary | ICD-10-CM | POA: Diagnosis not present

## 2018-02-23 DIAGNOSIS — W230XXA Caught, crushed, jammed, or pinched between moving objects, initial encounter: Secondary | ICD-10-CM | POA: Insufficient documentation

## 2018-02-23 DIAGNOSIS — Y929 Unspecified place or not applicable: Secondary | ICD-10-CM | POA: Insufficient documentation

## 2018-02-23 DIAGNOSIS — S6982XA Other specified injuries of left wrist, hand and finger(s), initial encounter: Secondary | ICD-10-CM | POA: Diagnosis present

## 2018-02-23 DIAGNOSIS — Y998 Other external cause status: Secondary | ICD-10-CM | POA: Insufficient documentation

## 2018-02-23 MED ORDER — HYDROCODONE-ACETAMINOPHEN 5-325 MG PO TABS: 1.0000 | ORAL_TABLET | Freq: Four times a day (QID) | ORAL | 0 refills | Status: DC | PRN

## 2018-02-23 NOTE — Discharge Instructions (Signed)
Please wear Velcro wrist brace as needed for comfort.  Apply ice to the wrist 20 minutes every hour for the next 2 days.  You may take ibuprofen along with Norco as needed for severe pain.  Follow-up with orthopedics if no improvement of pain or if any continued numbness and tingling in the next week.

## 2018-02-23 NOTE — ED Provider Notes (Signed)
[facility] EMERGENCY DEPARTMENT Provider Note   CSN: [id number] Arrival date & time: 02/23/18  1533     History   Chief Complaint Chief Complaint  Patient presents with  . Wrist Injury    HPI Eric Lyons is a 52 y.o. male.  Presents to the emergency department for evaluation of left wrist pain.  Patient states 3 days ago he slammed his left wrist into a car door.  He has had pain and swelling since.  Also has some mild numbness and tingling in the median nerve distribution with no weakness or sensation loss.  Patient's pain is 7 out of 10.  He has been trying to take over-the-counter medications with no significant improvement.  He is status post distal radius ORIF performed several years ago.  He denies any other injury to his body.  HPI  Past Medical History:  Diagnosis Date  . Asthma   . COPD (chronic obstructive pulmonary disease) [facility])     Patient Active Problem List   Diagnosis Date Noted  . Anxiety 05/27/2017  . Acute on chronic respiratory failure (HCC) 05/04/2017  . Acute respiratory failure (HCC) 12/31/2016  . COPD, severe (HCC) 12/12/2016  . Asthma exacerbation 12/06/2016  . COPD exacerbation (HCC) 11/13/2015  . Hypoxia 11/13/2015  . S/P ankle fusion 08/05/2012  . Obesity, unspecified 07/09/2012  . Post-traumatic arthritis of ankle, right 05/15/2012  . Chronic ankle pain 03/11/2012  . GERD (gastroesophageal reflux disease) 09/21/2011    Past Surgical History:  Procedure Laterality Date  . ankle fracture surgery    . HERNIA REPAIR    . KNEE ARTHROSCOPY    . TONSILLECTOMY         Home Medications    Prior to Admission medications   Medication Sig Start Date End Date Taking? Authorizing Provider  ADVAIR DISKUS 500-50 MCG/DOSE AEPB INHALE 1 PUFF BY MOUTH EVERY 12 HOURS 09/29/17   Merwyn Katos, MD  albuterol (PROVENTIL HFA;VENTOLIN HFA) 108 (90 Base) MCG/ACT inhaler Inhale 2 puffs into the lungs every 6 (six) hours as  needed for wheezing or shortness of breath. 12/08/16   Houston Siren, MD  fluticasone [facility]ana) 50 MCG/ACT nasal spray instill 2 sprays into each nostril once daily 07/21/17   Merwyn Katos, MD  HYDROcodone-acetaminophen (NORCO) 5-325 MG tablet Take 1 tablet by mouth every 6 (six) hours as needed for moderate pain. 02/23/18   Evon Slack, PA-C  Ipratropium-Albuterol (COMBIVENT) 20-100 MCG/ACT AERS respimat Inhale 2 puffs into the lungs 4 (four) times daily. This is first line rescue medication 02/27/17   Merwyn Katos, MD  ipratropium-albuterol (DUONEB) 0.5-2.5 (3) MG/3ML SOLN Take 3 mLs by nebulization every 6 (six) hours. DX: COPD DX Code: J44.9 02/28/17   Merwyn Katos, MD  predniSONE (STERAPRED UNI-PAK 21 TAB) 10 MG (21) TBPK tablet Take as directed. 09/29/17   Shane Crutch, MD    Family History Family History  Problem Relation Age of Onset  . Alcohol abuse Mother   . ALS Father     Social History Social History   Tobacco Use  . Smoking status: Never Smoker  . Smokeless tobacco: Never Used  Substance Use Topics  . Alcohol use: No  . Drug use: No     Allergies   Fentanyl and Prednisone   Review of Systems Review of Systems  Constitutional: Negative for fever.  Respiratory: Negative for shortness of breath.   Cardiovascular: Negative for chest pain.  Gastrointestinal: Negative for abdominal  pain.  Genitourinary: Negative for difficulty urinating, dysuria and urgency.  Musculoskeletal: Positive for arthralgias, joint swelling and myalgias. Negative for back pain.  Skin: Negative for rash.  Neurological: Positive for numbness. Negative for dizziness and headaches.     Physical Exam Updated Vital Signs BP (!) 150/91 (BP Location: Left Arm)   Pulse 98   Temp 98.2 F (36.8 C) (Oral)   Resp 18   Ht 5\' 5"  (1.651 m)   Wt 122.5 kg (270 lb)   SpO2 96%   BMI 44.93 kg/m   Physical Exam  Constitutional: He is oriented to person, place, and time. He  appears well-developed and well-nourished.  HENT:  Head: Normocephalic and atraumatic.  Eyes: Conjunctivae are normal.  Neck: Normal range of motion.  Cardiovascular: Normal rate.  Pulmonary/Chest: Effort normal. No respiratory distress.  Musculoskeletal:  Examination of the left upper extremity shows limited range of motion of the wrist secondary to mild pain, mild swelling.  Compartments appear to be soft.  He has good active and passive range of motion of the wrist with no significant signs of discomfort.  He is able to make a full fist grip strength is 4 out of 5 on the left compared to the right.  No skin breakdown is noted.  He is minimally tender to percussion along the distal radial metaphysis.  Is nontender throughout the forearm or elbow.  Neurovascular intact in left upper extremity.  Neurological: He is alert and oriented to person, place, and time.  Skin: Skin is warm. No rash noted.  Psychiatric: He has a normal mood and affect. His behavior is normal. Thought content normal.     ED Treatments / Results  Labs (all labs ordered are listed, but only abnormal results are displayed) Labs Reviewed - No data to display  EKG  EKG Interpretation None       Radiology Dg Wrist Complete Left  Result Date: 02/23/2018 CLINICAL DATA:  Injury to the wrist with pain posterior and laterally EXAM: LEFT WRIST - COMPLETE 3+ VIEW COMPARISON:  08/29/2013 FINDINGS: Status post surgical plate and screw fixation of the distal radius for prior fracture. No definite acute displaced fracture is seen. There is soft tissue swelling. No radiopaque foreign body. Old fracture deformity of the second metacarpal. IMPRESSION: 1. Status post surgical plate and screw fixation of the distal radius with old fracture deformity. No definite acute osseous abnormality 2. Old fracture deformity of the second metacarpal. Electronically Signed   By: Jasmine Pang M.D.   On: 02/23/2018 16:12    Procedures .Splint  Application Date/Time: 02/23/2018 5:04 PM Performed by: Evon Slack, PA-C Authorized by: Evon Slack, PA-C   Consent:    Consent obtained:  Verbal   Consent given by:  Patient   Alternatives discussed:  No treatment Pre-procedure details:    Sensation:  Normal Procedure details:    Laterality:  Left   Location:  Wrist   Wrist:  L wrist   Strapping: no     Splint type:  Wrist   Supplies:  Prefabricated splint Post-procedure details:    Pain:  Unchanged   Sensation:  Normal   Patient tolerance of procedure:  Tolerated well, no immediate complications   (including critical care time)  Medications Ordered in ED Medications - No data to display   Initial Impression / Assessment and Plan / ED Course  I have reviewed the triage vital signs and the nursing notes.  Pertinent labs & imaging results that were  available during my care of the patient were reviewed by me and considered in my medical decision making (see chart for details).     52 year old male with crush injury to the left wrist.  X-ray showed no evidence of acute bony abnormality.  Hardware is intact with no evidence of loosening or failure.  Patient has no skin breakdown noted.  No sign of any compartment syndrome.  Patient may have mild carpal tunnel syndrome due to numbness in the median nerve distribution.  Will place patient to carpal tunnel brace, he will rest ice and elevate the wrist.  He will take over-the-counter medications as needed for mild to moderate pain.  He will follow with orthopedics if no improvement 1 week.  Final Clinical Impressions(s) / ED Diagnoses   Final diagnoses:  Contusion of left wrist, initial encounter  Carpal tunnel syndrome of left wrist    ED Discharge Orders        Ordered    HYDROcodone-acetaminophen (NORCO) 5-325 MG tablet  Every 6 hours PRN     02/23/18 1659       Ronnette JuniperGaines, Thomas C, PA-C 02/23/18 1705    Sharyn CreamerQuale, Mark, MD 02/23/18 2356

## 2018-02-23 NOTE — ED Notes (Signed)
See triage note  States he got his left wrist shut in car door last Thursday  conts to have pain at wrist area   No deformity noted  Good pulses

## 2018-02-23 NOTE — ED Triage Notes (Signed)
Left wrist injury last Thursday.  States shut left wrist in car door.  C/O continued pain and swelling.

## 2018-03-05 ENCOUNTER — Ambulatory Visit: Payer: Medicare Other | Admitting: Pulmonary Disease

## 2018-06-08 ENCOUNTER — Other Ambulatory Visit: Payer: Self-pay | Admitting: Pulmonary Disease

## 2018-06-18 ENCOUNTER — Other Ambulatory Visit: Payer: Self-pay | Admitting: Pulmonary Disease

## 2018-06-25 ENCOUNTER — Encounter: Payer: Self-pay | Admitting: Pulmonary Disease

## 2018-07-27 ENCOUNTER — Other Ambulatory Visit: Payer: Self-pay | Admitting: Pulmonary Disease

## 2018-08-06 ENCOUNTER — Other Ambulatory Visit: Payer: Self-pay | Admitting: *Deleted

## 2018-08-06 ENCOUNTER — Other Ambulatory Visit: Payer: Self-pay | Admitting: Pulmonary Disease

## 2018-08-06 MED ORDER — IPRATROPIUM-ALBUTEROL 0.5-2.5 (3) MG/3ML IN SOLN: 3.0000 mL | Freq: Four times a day (QID) | RESPIRATORY_TRACT | 10 refills | Status: AC

## 2018-09-24 ENCOUNTER — Other Ambulatory Visit: Payer: Self-pay | Admitting: Pulmonary Disease

## 2018-10-01 ENCOUNTER — Encounter: Payer: Self-pay | Admitting: Emergency Medicine

## 2018-10-01 ENCOUNTER — Emergency Department
Admission: EM | Admit: 2018-10-01 | Discharge: 2018-10-01 | Disposition: A | Discharge status: Home / Self Care | Payer: Medicare Other | Attending: Emergency Medicine | Admitting: Emergency Medicine

## 2018-10-01 DIAGNOSIS — Z79899 Other long term (current) drug therapy: Secondary | ICD-10-CM | POA: Insufficient documentation

## 2018-10-01 DIAGNOSIS — H6121 Impacted cerumen, right ear: Secondary | ICD-10-CM | POA: Diagnosis not present

## 2018-10-01 DIAGNOSIS — J449 Chronic obstructive pulmonary disease, unspecified: Secondary | ICD-10-CM | POA: Insufficient documentation

## 2018-10-01 DIAGNOSIS — H9201 Otalgia, right ear: Secondary | ICD-10-CM | POA: Diagnosis present

## 2018-10-01 MED ORDER — DOCUSATE SODIUM 50 MG/5ML PO LIQD
50.0000 mg | Freq: Once | ORAL | Status: AC
  Administered 2018-10-01: 50 mg via ORAL
  Filled 2018-10-01: qty 10

## 2018-10-01 NOTE — ED Notes (Signed)
Pt reports r earache  - possible wax  Buildup   X  3 days  Hearing  Decreased  As well

## 2018-10-01 NOTE — ED Triage Notes (Signed)
Pt arrives with complaints of right ear pain for the last 2 days.

## 2018-10-01 NOTE — ED Notes (Signed)
r ear irrigated  Large amount wax returned pt states can hear much better

## 2018-10-01 NOTE — ED Provider Notes (Signed)
[facility] Emergency Department Provider Note  ____________________________________________   First MD Initiated Contact with Patient 10/01/18 1429     (approximate)  I have reviewed the triage vital signs and the nursing notes.   HISTORY  Chief Complaint Otalgia   HPI Eric Lyons is a 52 y.o. male presents to the ED with complaint of right ear pain.  Patient denies any injury to his ear and no upper respiratory symptoms.  Patient also has decreased hearing in his right ear.  He states that he has had problems with wax buildup in the past.   Past Medical History:  Diagnosis Date  . Asthma   . COPD (chronic obstructive pulmonary disease) [facility])     Patient Active Problem List   Diagnosis Date Noted  . Anxiety 05/27/2017  . Acute on chronic respiratory failure (HCC) 05/04/2017  . Acute respiratory failure (HCC) 12/31/2016  . COPD, severe (HCC) 12/12/2016  . Asthma exacerbation 12/06/2016  . COPD exacerbation (HCC) 11/13/2015  . Hypoxia 11/13/2015  . S/P ankle fusion 08/05/2012  . Obesity, unspecified 07/09/2012  . Post-traumatic arthritis of ankle, right 05/15/2012  . Chronic ankle pain 03/11/2012  . GERD (gastroesophageal reflux disease) 09/21/2011    Past Surgical History:  Procedure Laterality Date  . ankle fracture surgery    . HERNIA REPAIR    . KNEE ARTHROSCOPY    . TONSILLECTOMY      Prior to Admission medications   Medication Sig Start Date End Date Taking? Authorizing Provider  ADVAIR DISKUS 500-50 MCG/DOSE AEPB INHALE 1 PUFF BY MOUTH EVERY 12 HOURS 09/29/17   Merwyn Katos, MD  albuterol (PROVENTIL HFA;VENTOLIN HFA) 108 (90 Base) MCG/ACT inhaler Inhale 2 puffs into the lungs every 6 (six) hours as needed for wheezing or shortness of breath. 12/08/16   Houston Siren, MD  COMBIVENT RESPIMAT 20-100 MCG/ACT AERS respimat INHALE 2 PUFFS BY MOUTH FOUR TIMES DAILY 09/24/18   Merwyn Katos, MD  fluticasone [facility]) 50  MCG/ACT nasal spray INSTILL 2 SPRAYS INTO EACH NOSTRIL ONCE DAILY 06/08/18   Merwyn Katos, MD  ipratropium-albuterol (DUONEB) 0.5-2.5 (3) MG/3ML SOLN Take 3 mLs by nebulization every 6 (six) hours. DX: COPD DX Code: J44.9 08/06/18   Merwyn Katos, MD  predniSONE (STERAPRED UNI-PAK 21 TAB) 10 MG (21) TBPK tablet Take as directed. 09/29/17   Shane Crutch, MD    Allergies Fentanyl and Prednisone  Family History  Problem Relation Age of Onset  . Alcohol abuse Mother   . ALS Father     Social History Social History   Tobacco Use  . Smoking status: Never Smoker  . Smokeless tobacco: Never Used  Substance Use Topics  . Alcohol use: No  . Drug use: No    Review of Systems Constitutional: No fever/chills Eyes: No visual changes. ENT: No sore throat.  Positive right ear pain. Cardiovascular: Denies chest pain. Respiratory: Denies shortness of breath. Skin: Negative for rash. Neurological: Negative for headaches ____________________________________________   PHYSICAL EXAM:  VITAL SIGNS: ED Triage Vitals  Enc Vitals Group     BP 10/01/18 1419 121/77     Pulse Rate 10/01/18 1418 (!) 103     Resp 10/01/18 1418 16     Temp 10/01/18 1418 98.2 F (36.8 C)     Temp Source 10/01/18 1418 Oral     SpO2 10/01/18 1418 98 %     Weight 10/01/18 1419 260 lb (117.9 kg)     Height 10/01/18  1419 5\' 4"  (1.626 m)     Head Circumference --      Peak Flow --      Pain Score 10/01/18 1419 6     Pain Loc --      Pain Edu? --      Excl. in GC? --    Constitutional: Alert and oriented. Well appearing and in no acute distress. Eyes: Conjunctivae are normal.  Head: Atraumatic. Nose: No congestion/rhinnorhea.  Right EAC is occluded with cerumen.  After lavage TM is visible and nonerythematous. Mouth/Throat: Mucous membranes are moist.  Oropharynx non-erythematous. Neck: No stridor.   Cardiovascular: Normal rate, regular rhythm. Grossly normal heart sounds.  Good peripheral  circulation. Respiratory: Normal respiratory effort.  No retractions. Lungs CTAB. Musculoskeletal: No lower extremity tenderness nor edema.  No joint effusions. Neurologic:  Normal speech and language. No gross focal neurologic deficits are appreciated. Skin:  Skin is warm, dry and intact.  Psychiatric: Mood and affect are normal. Speech and behavior are normal.  ____________________________________________   LABS (all labs ordered are listed, but only abnormal results are displayed)  Labs Reviewed - No data to display   PROCEDURES  Procedure(s) performed: None  Procedures  Critical Care performed: No  ____________________________________________   INITIAL IMPRESSION / ASSESSMENT AND PLAN / ED COURSE  As part of my medical decision making, I reviewed the following data within the electronic MEDICAL RECORD NUMBER Notes from prior ED visits and [place] Controlled Substance Database  Patient presents to the ED with complaint of decreased hearing and ear pain in his right ear.  He denies any URI symptoms.  On exam canal is obstructed with cerumen.  This was lavaged until clear.  TM is unremarkable.  Patient states that he is able to hear and has no pain.  Patient was discharged to follow-up with his PCP if any continued problems. ____________________________________________   FINAL CLINICAL IMPRESSION(S) / ED DIAGNOSES  Final diagnoses:  Impacted cerumen of right ear     ED Discharge Orders    None       Note:  This document was prepared using Dragon voice recognition software and may include unintentional dictation errors.    Tommi Rumps, PA-C 10/01/18 1558    Emily Filbert, MD 10/02/18 [phone]

## 2018-10-01 NOTE — Discharge Instructions (Addendum)
Follow-up with your primary care provider if any continued problems. °

## 2018-11-30 ENCOUNTER — Emergency Department: Payer: Medicare Other

## 2018-11-30 ENCOUNTER — Other Ambulatory Visit: Payer: Self-pay

## 2018-11-30 ENCOUNTER — Emergency Department
Admission: EM | Admit: 2018-11-30 | Discharge: 2018-11-30 | Disposition: A | Discharge status: Home / Self Care | Payer: Medicare Other | Attending: Emergency Medicine | Admitting: Emergency Medicine

## 2018-11-30 DIAGNOSIS — Y999 Unspecified external cause status: Secondary | ICD-10-CM | POA: Diagnosis not present

## 2018-11-30 DIAGNOSIS — Y939 Activity, unspecified: Secondary | ICD-10-CM | POA: Insufficient documentation

## 2018-11-30 DIAGNOSIS — S79911A Unspecified injury of right hip, initial encounter: Secondary | ICD-10-CM | POA: Diagnosis present

## 2018-11-30 DIAGNOSIS — S9030XA Contusion of unspecified foot, initial encounter: Secondary | ICD-10-CM

## 2018-11-30 DIAGNOSIS — S7001XA Contusion of right hip, initial encounter: Secondary | ICD-10-CM | POA: Insufficient documentation

## 2018-11-30 DIAGNOSIS — W208XXA Other cause of strike by thrown, projected or falling object, initial encounter: Secondary | ICD-10-CM | POA: Diagnosis not present

## 2018-11-30 DIAGNOSIS — J449 Chronic obstructive pulmonary disease, unspecified: Secondary | ICD-10-CM | POA: Insufficient documentation

## 2018-11-30 DIAGNOSIS — Z79899 Other long term (current) drug therapy: Secondary | ICD-10-CM | POA: Diagnosis not present

## 2018-11-30 DIAGNOSIS — S9031XA Contusion of right foot, initial encounter: Secondary | ICD-10-CM | POA: Diagnosis not present

## 2018-11-30 DIAGNOSIS — Y929 Unspecified place or not applicable: Secondary | ICD-10-CM | POA: Diagnosis not present

## 2018-11-30 MED ORDER — KETOROLAC TROMETHAMINE 30 MG/ML IJ SOLN
30.0000 mg | Freq: Once | INTRAMUSCULAR | Status: AC
  Administered 2018-11-30: 30 mg via INTRAMUSCULAR
  Filled 2018-11-30: qty 1

## 2018-11-30 MED ORDER — NAPROXEN 500 MG PO TABS: 500.0000 mg | ORAL_TABLET | Freq: Two times a day (BID) | ORAL | 2 refills | Status: AC

## 2018-11-30 NOTE — ED Notes (Signed)
Pt taken to lobby in wheelchair. He is waiting on his ride. VSS. NAD. Discharge instructions, RX and follow up reviewed. All questions and concerns addressed.

## 2018-11-30 NOTE — ED Provider Notes (Signed)
[facility] Emergency Department Provider Note   ____________________________________________    I have reviewed the triage vital signs and the nursing notes.   HISTORY  Chief Complaint Trauma     HPI Eric Lyons is a 52 y.o. male presents with complaints of right hip pain, right knee pain and right foot pain.  Patient reports 2 days ago a 70 pound pump motor fell off a table onto his right foot which led him to lose his balance and fall over onto his right hip.  He has been able to ambulate since then but describes significant pain especially in his right hip.  No other injuries reported.  Does have a history of significant right ankle surgery  Past Medical History:  Diagnosis Date  . Asthma   . COPD (chronic obstructive pulmonary disease) [facility])     Patient Active Problem List   Diagnosis Date Noted  . Anxiety 05/27/2017  . Acute on chronic respiratory failure (HCC) 05/04/2017  . Acute respiratory failure (HCC) 12/31/2016  . COPD, severe (HCC) 12/12/2016  . Asthma exacerbation 12/06/2016  . COPD exacerbation (HCC) 11/13/2015  . Hypoxia 11/13/2015  . S/P ankle fusion 08/05/2012  . Obesity, unspecified 07/09/2012  . Post-traumatic arthritis of ankle, right 05/15/2012  . Chronic ankle pain 03/11/2012  . GERD (gastroesophageal reflux disease) 09/21/2011    Past Surgical History:  Procedure Laterality Date  . ankle fracture surgery    . HERNIA REPAIR    . KNEE ARTHROSCOPY    . TONSILLECTOMY      Prior to Admission medications   Medication Sig Start Date End Date Taking? Authorizing Provider  ADVAIR DISKUS 500-50 MCG/DOSE AEPB INHALE 1 PUFF BY MOUTH EVERY 12 HOURS 09/29/17   Merwyn Katos, MD  albuterol (PROVENTIL HFA;VENTOLIN HFA) 108 (90 Base) MCG/ACT inhaler Inhale 2 puffs into the lungs every 6 (six) hours as needed for wheezing or shortness of breath. 12/08/16   Houston Siren, MD  COMBIVENT RESPIMAT 20-100 MCG/ACT AERS  respimat INHALE 2 PUFFS BY MOUTH FOUR TIMES DAILY 09/24/18   Merwyn Katos, MD  fluticasone [facility]) 50 MCG/ACT nasal spray INSTILL 2 SPRAYS INTO EACH NOSTRIL ONCE DAILY 06/08/18   Merwyn Katos, MD  ipratropium-albuterol (DUONEB) 0.5-2.5 (3) MG/3ML SOLN Take 3 mLs by nebulization every 6 (six) hours. DX: COPD DX Code: J44.9 08/06/18   Merwyn Katos, MD  naproxen (NAPROSYN) 500 MG tablet Take 1 tablet (500 mg total) by mouth 2 (two) times daily with a meal. 11/30/18   Jene Every, MD  predniSONE (STERAPRED UNI-PAK 21 TAB) 10 MG (21) TBPK tablet Take as directed. 09/29/17   Shane Crutch, MD     Allergies Fentanyl and Prednisone  Family History  Problem Relation Age of Onset  . Alcohol abuse Mother   . ALS Father     Social History Social History   Tobacco Use  . Smoking status: Never Smoker  . Smokeless tobacco: Never Used  Substance Use Topics  . Alcohol use: No  . Drug use: No    Review of Systems  Constitutional: No dizziness Eyes: No visual changes.  ENT: No neck pain Cardiovascular: Denies chest wall pain Respiratory: Denies shortness of breath. Gastrointestinal: No abdominal pain.   Genitourinary: No groin injury. Musculoskeletal: As above Skin: Negative for laceration or abrasion Neurological: Negative for headaches   ____________________________________________   PHYSICAL EXAM:  VITAL SIGNS: ED Triage Vitals  Enc Vitals Group     BP 11/30/18  1918 (!) 145/91     Pulse Rate 11/30/18 1918 86     Resp 11/30/18 1918 18     Temp 11/30/18 1918 98.4 F (36.9 C)     Temp Source 11/30/18 1918 Oral     SpO2 11/30/18 1918 95 %     Weight 11/30/18 1917 108.9 kg (240 lb)     Height 11/30/18 1917 1.651 m (5\' 5" )     Head Circumference --      Peak Flow --      Pain Score 11/30/18 1917 10     Pain Loc --      Pain Edu? --      Excl. in GC? --     Constitutional: Alert and oriented. No acute distress. Pleasant and interactive  Head:  Atraumatic. Nose: No congestion/rhinnorhea. Mouth/Throat: Mucous membranes are moist.    Cardiovascular: Normal rate, regular rhythm.  Good peripheral circulation. Respiratory: Normal respiratory effort.  No retractions.  Gastrointestinal: Soft and nontender. No distention.    Musculoskeletal: Mild tenderness palpation along the right greater trochanter of the hip, full range of motion no pain with axial load.  Right knee exam is unremarkable no significant swelling.  Mild tenderness over the dorsum of the midfoot on the right but no significant swelling or bony abnormalities.  Warm and well perfused extremities Neurologic:  Normal speech and language. No gross focal neurologic deficits are appreciated.  Skin:  Skin is warm, dry and intact. No rash noted. Psychiatric: Mood and affect are normal. Speech and behavior are normal.  ____________________________________________   LABS (all labs ordered are listed, but only abnormal results are displayed)  Labs Reviewed - No data to display ____________________________________________  EKG  None ____________________________________________  RADIOLOGY  X-ray hip ankle and knee negative for fracture ____________________________________________   PROCEDURES  Procedure(s) performed: No  Procedures   Critical Care performed: No ____________________________________________   INITIAL IMPRESSION / ASSESSMENT AND PLAN / ED COURSE  Pertinent labs & imaging results that were available during my care of the patient were reviewed by me and considered in my medical decision making (see chart for details).  Reassured by x-ray results, treated with IM Toradol, outpatient follow-up as needed.    ____________________________________________   FINAL CLINICAL IMPRESSION(S) / ED DIAGNOSES  Final diagnoses:  Contusion of right hip, initial encounter  Contusion of dorsum of foot        Note:  This document was prepared using Dragon  voice recognition software and may include unintentional dictation errors.    Jene EveryKinner, Ryken Paschal, MD 11/30/18 2205

## 2018-11-30 NOTE — ED Triage Notes (Signed)
Pt arrives to ED via POV from home with c/o right knee, ankle and hip pain s/p fall 2 days ago. Pt states a 75lb motor fell of a bench in his shop and landed on his ankle and leg. Pt arrives on 2L O2 from home that he uses PRN. Pt with some swelling noted to right ankle, but no obvious deformity or dislocation noted.

## 2019-02-22 ENCOUNTER — Emergency Department: Payer: Medicare Other

## 2019-02-22 ENCOUNTER — Encounter: Payer: Self-pay | Admitting: Emergency Medicine

## 2019-02-22 ENCOUNTER — Other Ambulatory Visit: Payer: Self-pay

## 2019-02-22 DIAGNOSIS — J441 Chronic obstructive pulmonary disease with (acute) exacerbation: Principal | ICD-10-CM | POA: Insufficient documentation

## 2019-02-22 DIAGNOSIS — K219 Gastro-esophageal reflux disease without esophagitis: Secondary | ICD-10-CM | POA: Insufficient documentation

## 2019-02-22 DIAGNOSIS — Z7982 Long term (current) use of aspirin: Secondary | ICD-10-CM | POA: Diagnosis not present

## 2019-02-22 DIAGNOSIS — Z791 Long term (current) use of non-steroidal anti-inflammatories (NSAID): Secondary | ICD-10-CM | POA: Diagnosis not present

## 2019-02-22 DIAGNOSIS — Z6841 Body Mass Index (BMI) 40.0 and over, adult: Secondary | ICD-10-CM | POA: Insufficient documentation

## 2019-02-22 DIAGNOSIS — F419 Anxiety disorder, unspecified: Secondary | ICD-10-CM | POA: Insufficient documentation

## 2019-02-22 DIAGNOSIS — E662 Morbid (severe) obesity with alveolar hypoventilation: Secondary | ICD-10-CM | POA: Diagnosis not present

## 2019-02-22 DIAGNOSIS — J9621 Acute and chronic respiratory failure with hypoxia: Secondary | ICD-10-CM | POA: Diagnosis present

## 2019-02-22 DIAGNOSIS — Z7951 Long term (current) use of inhaled steroids: Secondary | ICD-10-CM | POA: Insufficient documentation

## 2019-02-22 DIAGNOSIS — Z79899 Other long term (current) drug therapy: Secondary | ICD-10-CM | POA: Insufficient documentation

## 2019-02-22 DIAGNOSIS — Z9981 Dependence on supplemental oxygen: Secondary | ICD-10-CM | POA: Diagnosis not present

## 2019-02-22 DIAGNOSIS — R079 Chest pain, unspecified: Secondary | ICD-10-CM | POA: Diagnosis present

## 2019-02-22 LAB — CBC WITH DIFFERENTIAL/PLATELET
Abs Immature Granulocytes: 0.03 10*3/uL (ref 0.00–0.07)
Basophils Absolute: 0 10*3/uL (ref 0.0–0.1)
Basophils Relative: 1 %
Eosinophils Absolute: 0.2 10*3/uL (ref 0.0–0.5)
Eosinophils Relative: 3 %
HCT: 49.3 % (ref 39.0–52.0)
Hemoglobin: 15.3 g/dL (ref 13.0–17.0)
Immature Granulocytes: 0 %
Lymphocytes Relative: 18 %
Lymphs Abs: 1.6 10*3/uL (ref 0.7–4.0)
MCH: 28.9 pg (ref 26.0–34.0)
MCHC: 31 g/dL (ref 30.0–36.0)
MCV: 93.2 fL (ref 80.0–100.0)
Monocytes Absolute: 0.7 10*3/uL (ref 0.1–1.0)
Monocytes Relative: 7 %
Neutro Abs: 6.3 10*3/uL (ref 1.7–7.7)
Neutrophils Relative %: 71 %
Platelets: 263 10*3/uL (ref 150–400)
RBC: 5.29 MIL/uL (ref 4.22–5.81)
RDW: 12.9 % (ref 11.5–15.5)
WBC: 8.8 10*3/uL (ref 4.0–10.5)
nRBC: 0 % (ref 0.0–0.2)

## 2019-02-22 LAB — COMPREHENSIVE METABOLIC PANEL
ALT: 26 U/L (ref 0–44)
AST: 23 U/L (ref 15–41)
Albumin: 4 g/dL (ref 3.5–5.0)
Alkaline Phosphatase: 56 U/L (ref 38–126)
Anion gap: 9 (ref 5–15)
BUN: 12 mg/dL (ref 6–20)
CO2: 32 mmol/L (ref 22–32)
Calcium: 9.1 mg/dL (ref 8.9–10.3)
Chloride: 99 mmol/L (ref 98–111)
Creatinine, Ser: 0.77 mg/dL (ref 0.61–1.24)
GFR calc Af Amer: >60 mL/min (ref >60)
GFR calc non Af Amer: >60 mL/min (ref >60)
Glucose, Bld: 151 mg/dL — ABNORMAL HIGH (ref 70–99)
Potassium: 3.4 mmol/L — ABNORMAL LOW (ref 3.5–5.1)
Sodium: 140 mmol/L (ref 135–145)
Total Bilirubin: 0.4 mg/dL (ref 0.3–1.2)
Total Protein: 6.8 g/dL (ref 6.5–8.1)

## 2019-02-22 LAB — TROPONIN I: Troponin I: <0.03 ng/mL (ref <0.03)

## 2019-02-22 NOTE — ED Triage Notes (Signed)
Pt to triage via w/c with no distress noted; pt reports since this am having left sided CP radiating under axillae accomp by [facility]; pt st hx COPD and on O2 at 2l/min via [place]; O2 placed as requested

## 2019-02-23 ENCOUNTER — Observation Stay
Admission: EM | Admit: 2019-02-23 | Discharge: 2019-02-24 | Disposition: A | Discharge status: Home / Self Care | Payer: Medicare Other | Attending: Internal Medicine | Admitting: Internal Medicine

## 2019-02-23 ENCOUNTER — Other Ambulatory Visit: Payer: Self-pay

## 2019-02-23 DIAGNOSIS — J9621 Acute and chronic respiratory failure with hypoxia: Secondary | ICD-10-CM | POA: Diagnosis present

## 2019-02-23 DIAGNOSIS — J441 Chronic obstructive pulmonary disease with (acute) exacerbation: Secondary | ICD-10-CM

## 2019-02-23 DIAGNOSIS — R079 Chest pain, unspecified: Secondary | ICD-10-CM

## 2019-02-23 LAB — TROPONIN I: Troponin I: <0.03 ng/mL (ref <0.03)

## 2019-02-23 LAB — MRSA PCR SCREENING: MRSA by PCR: POSITIVE — AB

## 2019-02-23 MED ORDER — ASPIRIN 81 MG PO CHEW
324.0000 mg | CHEWABLE_TABLET | Freq: Once | ORAL | Status: AC
  Administered 2019-02-23: 324 mg via ORAL
  Filled 2019-02-23: qty 4

## 2019-02-23 MED ORDER — IPRATROPIUM-ALBUTEROL 0.5-2.5 (3) MG/3ML IN SOLN
3.0000 mL | Freq: Once | RESPIRATORY_TRACT | Status: AC
  Administered 2019-02-23: 3 mL via RESPIRATORY_TRACT
  Filled 2019-02-23: qty 3

## 2019-02-23 MED ORDER — POTASSIUM CHLORIDE CRYS ER 20 MEQ PO TBCR
20.0000 meq | EXTENDED_RELEASE_TABLET | Freq: Once | ORAL | Status: AC
  Administered 2019-02-23: 20 meq via ORAL
  Filled 2019-02-23: qty 1

## 2019-02-23 MED ORDER — PREDNISONE 10 MG PO TABS: ORAL_TABLET | ORAL | 0 refills | Status: DC

## 2019-02-23 MED ORDER — AZITHROMYCIN 250 MG PO TABS
500.0000 mg | ORAL_TABLET | Freq: Every day | ORAL | Status: AC
  Administered 2019-02-23: 500 mg via ORAL
  Filled 2019-02-23: qty 2

## 2019-02-23 MED ORDER — ONDANSETRON HCL 4 MG PO TABS: 4.0000 mg | ORAL_TABLET | Freq: Four times a day (QID) | ORAL | Status: DC | PRN

## 2019-02-23 MED ORDER — MORPHINE SULFATE (PF) 2 MG/ML IV SOLN: 2.0000 mg | INTRAVENOUS | Status: DC | PRN

## 2019-02-23 MED ORDER — AZITHROMYCIN 250 MG PO TABS
250.0000 mg | ORAL_TABLET | Freq: Every day | ORAL | Status: DC
  Administered 2019-02-24: 250 mg via ORAL
  Filled 2019-02-23: qty 1

## 2019-02-23 MED ORDER — ACETAMINOPHEN 325 MG PO TABS: 650.0000 mg | ORAL_TABLET | Freq: Four times a day (QID) | ORAL | Status: DC | PRN

## 2019-02-23 MED ORDER — METHYLPREDNISOLONE SODIUM SUCC 125 MG IJ SOLR
125.0000 mg | Freq: Once | INTRAMUSCULAR | Status: AC
  Administered 2019-02-23: 125 mg via INTRAVENOUS
  Filled 2019-02-23: qty 2

## 2019-02-23 MED ORDER — IPRATROPIUM-ALBUTEROL 0.5-2.5 (3) MG/3ML IN SOLN
3.0000 mL | Freq: Four times a day (QID) | RESPIRATORY_TRACT | Status: DC
  Administered 2019-02-23 – 2019-02-24 (×5): 3 mL via RESPIRATORY_TRACT
  Filled 2019-02-23 (×4): qty 3

## 2019-02-23 MED ORDER — ENOXAPARIN SODIUM 40 MG/0.4ML ~~LOC~~ SOLN
40.0000 mg | Freq: Two times a day (BID) | SUBCUTANEOUS | Status: DC
  Administered 2019-02-23: 40 mg via SUBCUTANEOUS
  Filled 2019-02-23: qty 0.4

## 2019-02-23 MED ORDER — POTASSIUM CHLORIDE CRYS ER 20 MEQ PO TBCR
40.0000 meq | EXTENDED_RELEASE_TABLET | ORAL | Status: AC
  Administered 2019-02-23 (×2): 40 meq via ORAL
  Filled 2019-02-23 (×2): qty 2

## 2019-02-23 MED ORDER — PREDNISONE 50 MG PO TABS
50.0000 mg | ORAL_TABLET | Freq: Every day | ORAL | Status: DC
  Administered 2019-02-23 – 2019-02-24 (×2): 50 mg via ORAL
  Filled 2019-02-23 (×2): qty 1

## 2019-02-23 MED ORDER — MAGNESIUM SULFATE 2 GM/50ML IV SOLN
2.0000 g | Freq: Once | INTRAVENOUS | Status: AC
  Administered 2019-02-23: 2 g via INTRAVENOUS
  Filled 2019-02-23: qty 50

## 2019-02-23 MED ORDER — ENOXAPARIN SODIUM 40 MG/0.4ML ~~LOC~~ SOLN
40.0000 mg | SUBCUTANEOUS | Status: DC
  Administered 2019-02-23: 40 mg via SUBCUTANEOUS
  Filled 2019-02-23: qty 0.4

## 2019-02-23 MED ORDER — ALBUTEROL SULFATE (2.5 MG/3ML) 0.083% IN NEBU
2.5000 mg | INHALATION_SOLUTION | RESPIRATORY_TRACT | Status: DC | PRN
  Administered 2019-02-23: 2.5 mg via RESPIRATORY_TRACT
  Filled 2019-02-23: qty 3

## 2019-02-23 MED ORDER — FLUTICASONE PROPIONATE 50 MCG/ACT NA SUSP
2.0000 | Freq: Every day | NASAL | Status: DC
  Administered 2019-02-23 – 2019-02-24 (×2): 2 via NASAL
  Filled 2019-02-23: qty 16

## 2019-02-23 MED ORDER — IPRATROPIUM-ALBUTEROL 0.5-2.5 (3) MG/3ML IN SOLN
RESPIRATORY_TRACT | Status: AC
  Administered 2019-02-23: 12:00:00
  Filled 2019-02-23: qty 3

## 2019-02-23 MED ORDER — TIOTROPIUM BROMIDE MONOHYDRATE 18 MCG IN CAPS
1.0000 | ORAL_CAPSULE | Freq: Every day | RESPIRATORY_TRACT | Status: DC
  Administered 2019-02-23 – 2019-02-24 (×2): 18 ug via RESPIRATORY_TRACT
  Filled 2019-02-23: qty 5

## 2019-02-23 MED ORDER — OXYCODONE-ACETAMINOPHEN 5-325 MG PO TABS
2.0000 | ORAL_TABLET | Freq: Four times a day (QID) | ORAL | Status: DC | PRN
  Administered 2019-02-23 – 2019-02-24 (×4): 2 via ORAL
  Filled 2019-02-23 (×4): qty 2

## 2019-02-23 MED ORDER — ONDANSETRON HCL 4 MG/2ML IJ SOLN: 4.0000 mg | Freq: Four times a day (QID) | INTRAMUSCULAR | Status: DC | PRN

## 2019-02-23 MED ORDER — DOCUSATE SODIUM 100 MG PO CAPS
100.0000 mg | ORAL_CAPSULE | Freq: Two times a day (BID) | ORAL | Status: DC
  Administered 2019-02-23 – 2019-02-24 (×3): 100 mg via ORAL
  Filled 2019-02-23 (×4): qty 1

## 2019-02-23 MED ORDER — ACETAMINOPHEN 650 MG RE SUPP: 650.0000 mg | Freq: Four times a day (QID) | RECTAL | Status: DC | PRN

## 2019-02-23 NOTE — Discharge Instructions (Addendum)
As we discussed, I believe that your chest discomfort and shortness of breath are related to both your COPD exacerbation and the anxiety that can result from having a hard time breathing.  Your work-up is reassuring today including your lab work, EKG, and chest x-ray.  We gave you treatments in the emergency department and started you on a prednisone taper.  Please continue using your medications at home and if you are not already taking a daily baby aspirin, we encourage you to do so.  Please follow-up with your primary care doctor with the next available follow-up appointment this week.  Return to the emergency department if you develop new or worsening symptoms that concern you.

## 2019-02-23 NOTE — Progress Notes (Signed)
PHARMACIST - PHYSICIAN COMMUNICATION  CONCERNING:  Enoxaparin (Lovenox) for DVT Prophylaxis    RECOMMENDATION: Patient was prescribed enoxaprin 40mg  q24 hours for VTE prophylaxis.   Filed Weights   02/22/19 1906 02/23/19 0501  Weight: 225 lb (102.1 kg) 247 lb 14.4 oz (112.4 kg)    Body mass index is 42.55 kg/m.  Estimated Creatinine Clearance: 121.6 mL/min (by C-G formula based on SCr of 0.77 mg/dL).   [facility] patient is candidate for enoxaparin 40mg  every 12 hour dosing due to BMI being >40.  DESCRIPTION: Pharmacy has adjusted enoxaparin dose per [facility] policy.  Patient is now receiving enoxaparin 40mg  every 12 hours.   Ronnald Ramp, PharmD, BCPS Clinical Pharmacist  02/23/2019 8:29 AM

## 2019-02-23 NOTE — ED Notes (Signed)
Resumed care from [place], [place]. Pt sitting in chair in room with no complaints at this time.

## 2019-02-23 NOTE — ED Provider Notes (Addendum)
[facility] Emergency Department Provider Note  ____________________________________________   First MD Initiated Contact with Patient 02/23/19 0113     (approximate)  I have reviewed the triage vital signs and the nursing notes.   HISTORY  Chief Complaint Chest Pain    HPI  The patient is a 53 year old man with severe COPD and asthma that is oxygen dependent at night although he reports he previously has been oxygen dependent 24 hours a day.  He presents for evaluation of a combination of symptoms that includes gradually worsening shortness of breath and wheezing over the last couple days with some generalized chest pressure.  He says that he also suffers from anxiety and his breathing seems to get worse when the weather gets worse (which it has been) which leads to some pain in his ankle, which leads to worsen anxiety, which leads to worsening breathing and chest pain.  He is currently breathing comfortably on 2 L of oxygen by nasal cannula.  He has not been on steroids for a few months.  He says his breathing treatments at home were not making it better and his symptoms are worse with exertion.  He has no history of coronary artery disease and he does not have a diagnosis of hypertension although he says his blood pressure goes up when he gets anxious and when he is in pain and short of breath.  He denies fever/chills, nasal congestion, runny nose, vomiting, abdominal pain, and dysuria.  He describes his symptoms as severe.   Past Medical History:  Diagnosis Date  . Asthma   . COPD (chronic obstructive pulmonary disease) [facility])     Patient Active Problem List   Diagnosis Date Noted  . Anxiety 05/27/2017  . Acute on chronic respiratory failure (HCC) 05/04/2017  . Acute respiratory failure (HCC) 12/31/2016  . COPD, severe (HCC) 12/12/2016  . Asthma exacerbation 12/06/2016  . COPD exacerbation (HCC) 11/13/2015  . Hypoxia 11/13/2015  . S/P ankle fusion  08/05/2012  . Obesity, unspecified 07/09/2012  . Post-traumatic arthritis of ankle, right 05/15/2012  . Chronic ankle pain 03/11/2012  . GERD (gastroesophageal reflux disease) 09/21/2011    Past Surgical History:  Procedure Laterality Date  . ankle fracture surgery    . HERNIA REPAIR    . KNEE ARTHROSCOPY    . TONSILLECTOMY      Prior to Admission medications   Medication Sig Start Date End Date Taking? Authorizing Provider  ADVAIR DISKUS 500-50 MCG/DOSE AEPB INHALE 1 PUFF BY MOUTH EVERY 12 HOURS 09/29/17   Merwyn Katos, MD  albuterol (PROVENTIL HFA;VENTOLIN HFA) 108 (90 Base) MCG/ACT inhaler Inhale 2 puffs into the lungs every 6 (six) hours as needed for wheezing or shortness of breath. 12/08/16   Houston Siren, MD  COMBIVENT RESPIMAT 20-100 MCG/ACT AERS respimat INHALE 2 PUFFS BY MOUTH FOUR TIMES DAILY 09/24/18   Merwyn Katos, MD  fluticasone [facility]) 50 MCG/ACT nasal spray INSTILL 2 SPRAYS INTO EACH NOSTRIL ONCE DAILY 06/08/18   Merwyn Katos, MD  ipratropium-albuterol (DUONEB) 0.5-2.5 (3) MG/3ML SOLN Take 3 mLs by nebulization every 6 (six) hours. DX: COPD DX Code: J44.9 08/06/18   Merwyn Katos, MD  naproxen (NAPROSYN) 500 MG tablet Take 1 tablet (500 mg total) by mouth 2 (two) times daily with a meal. 11/30/18   Jene Every, MD  predniSONE (DELTASONE) 10 MG tablet Take 6 tabs (60 mg) PO x 3 days, then take 4 tabs (40 mg) PO x 3 days,  then take 2 tabs (20 mg) PO x 3 days, then take 1 tab (10 mg) PO x 3 days, then take 1/2 tab (5 mg) PO x 4 days. 02/23/19   Loleta Rose, MD    Allergies Fentanyl and Prednisone  Family History  Problem Relation Age of Onset  . Alcohol abuse Mother   . ALS Father     Social History Social History   Tobacco Use  . Smoking status: Never Smoker  . Smokeless tobacco: Never Used  Substance Use Topics  . Alcohol use: No  . Drug use: No    Review of Systems Constitutional: No fever/chills Eyes: No visual changes. ENT: No  sore throat. Cardiovascular: Chest pressure for a couple of days Respiratory: Gradually worsening shortness of breath and wheezing for a couple of days as described above Gastrointestinal: No abdominal pain.  No nausea, no vomiting.  No diarrhea.  No constipation. Genitourinary: Negative for dysuria. Musculoskeletal: Negative for neck pain.  Negative for back pain. Integumentary: Negative for rash. Neurological: Negative for headaches, focal weakness or numbness.   ____________________________________________   PHYSICAL EXAM:  VITAL SIGNS: ED Triage Vitals  Enc Vitals Group     BP 02/22/19 1910 (!) 198/100     Pulse Rate 02/22/19 1910 82     Resp 02/22/19 1910 (!) 24     Temp 02/22/19 1910 97.9 F (36.6 C)     Temp Source 02/22/19 1910 Oral     SpO2 02/22/19 1910 94 %     Weight 02/22/19 1906 102.1 kg (225 lb)     Height 02/22/19 1906 1.626 m (5\' 4" )     Head Circumference --      Peak Flow --      Pain Score 02/22/19 1906 8     Pain Loc --      Pain Edu? --      Excl. in GC? --     Constitutional: Alert and oriented.  Generally well appearing and in no acute distress. Eyes: Conjunctivae are normal.  Head: Atraumatic. Nose: No congestion/rhinnorhea. Mouth/Throat: Mucous membranes are moist. Neck: No stridor.  No meningeal signs.   Cardiovascular: Normal rate, regular rhythm. Good peripheral circulation. Grossly normal heart sounds. Respiratory: No retractions or accessory muscle usage.  The patient has some end expiratory wheezing and diminished breath sounds throughout lung fields consistent with his history of both COPD and asthma. Gastrointestinal: Soft and nontender. No distention.  Musculoskeletal: No lower extremity tenderness nor edema. No gross deformities of extremities. Neurologic:  Normal speech and language. No gross focal neurologic deficits are appreciated.  Skin:  Skin is warm, dry and intact. No rash noted. Psychiatric: Mood and affect are anxious but  generally appropriate under the circumstances.  ____________________________________________   LABS (all labs ordered are listed, but only abnormal results are displayed)  Labs Reviewed  COMPREHENSIVE METABOLIC PANEL - Abnormal; Notable for the following components:      Result Value   Potassium 3.4 (*)    Glucose, Bld 151 (*)    All other components within normal limits  CBC WITH DIFFERENTIAL/PLATELET  TROPONIN I  TROPONIN I   ____________________________________________  EKG  ED ECG REPORT I, Loleta Rose, the attending physician, personally viewed and interpreted this ECG.  Date: 02/22/2019 EKG Time: 19: 08 Rate: 92 Rhythm: normal sinus rhythm QRS Axis: normal Intervals: normal ST/T Wave abnormalities: normal Narrative Interpretation: no evidence of acute ischemia  ____________________________________________  RADIOLOGY I, Loleta Rose, personally viewed and evaluated these images (plain  radiographs) as part of my medical decision making, as well as reviewing the written report by the radiologist.  ED MD interpretation: No evidence of acute abnormality on chest x-ray  Official radiology report(s): Dg Chest 2 View  Result Date: 02/22/2019 CLINICAL DATA:  LEFT-sided chest pain this morning. EXAM: CHEST - 2 VIEW COMPARISON:  Chest x-ray dated 05/04/2017. FINDINGS: Heart size is upper normal, stable. Lungs are clear. No pleural effusion or pneumothorax seen. No acute or suspicious osseous finding. IMPRESSION: No active cardiopulmonary disease. No evidence of pneumonia or pulmonary edema. Electronically Signed   By: Bary Richard M.D.   On: 02/22/2019 19:35    ____________________________________________   PROCEDURES   Procedure(s) performed (including Critical Care):  Procedures   ____________________________________________   INITIAL IMPRESSION / MDM / ASSESSMENT AND PLAN / ED COURSE  As part of my medical decision making, I reviewed the following data  within the electronic MEDICAL RECORD NUMBER Nursing notes reviewed and incorporated, Labs reviewed , EKG interpreted , Old chart reviewed, Radiograph reviewed  and Notes from prior ED visits  HEAR Score: 4    Differential diagnosis includes, but is not limited to, COPD exacerbation, ACS, pneumonia, PE.  I strongly suspect the patient's symptoms are all secondary to COPD exacerbation and that anxiety is also playing a component.  He has decreased lung sounds throughout but is not using accessory muscles and he has a well-documented history of severe COPD.  He has oxygen available at home as well as nebulizers and breathing treatments.  His EKG shows no sign of ischemia, lab work is all reassuring including an essentially normal comprehensive metabolic panel, normal troponin, normal CBC.  Although the patient's HEAR score is slightly elevated, I believe he is appropriate for discharge and outpatient follow-up because his symptoms are due to his COPD and anxiety.  I am giving him 3 duo nebs and Solu-Medrol 125 mg IV.  I will check a second troponin while he is getting his breathing treatments and steroids.  Assuming the second troponin is negative he agrees with the plan for discharge and outpatient follow-up.  I will also give a full dose aspirin.  He understands and agrees with the plan.  Clinical Course as of Feb 23 299  Tue Feb 23, 2019  0225 Repeat troponin is negative.  I will plan for discharge as previously described.  Blood pressure has improved since he has been in the emergency department.  Troponin I: <0.03 [CF]  0244 Unfortunately patient still feels short of breath and is now retracting in spite of the nebulizer treatments.  Still has diminished breath sounds throughout.  His oxygen saturation is 88 to 89% on 3 L of oxygen at rest.  I looked at his care management notes and this is significantly lower than his baseline as typically he is around 91% on room air and only drops to the 80s with  exertion.I have ordered magnesium 2 g IV and additional albuterol treatments and will contact the hospitalist for admission for COPD exacerbation.   [CF]  0245 I have paged the hospitalist to discuss.   [CF]  0300 I discussed the case by phone with Dr. Anne Hahn with the hospitalist service.  He will pass along the case to Dr. Sheryle Hail for admission.   [CF]    Clinical Course User Index [CF] Loleta Rose, MD    ____________________________________________  FINAL CLINICAL IMPRESSION(S) / ED DIAGNOSES  Final diagnoses:  COPD exacerbation (HCC)  Chest pain, unspecified type  Acute on  chronic respiratory failure with hypoxemia (HCC)     MEDICATIONS GIVEN DURING THIS VISIT:  Medications  magnesium sulfate IVPB 2 g 50 mL (2 g Intravenous New Bag/Given 02/23/19 0255)  albuterol (PROVENTIL) (2.5 MG/3ML) 0.083% nebulizer solution 2.5 mg (2.5 mg Nebulization Given 02/23/19 0255)  methylPREDNISolone sodium succinate (SOLU-MEDROL) 125 mg/2 mL injection 125 mg (125 mg Intravenous Given 02/23/19 0151)  ipratropium-albuterol (DUONEB) 0.5-2.5 (3) MG/3ML nebulizer solution 3 mL (3 mLs Nebulization Given 02/23/19 0142)  ipratropium-albuterol (DUONEB) 0.5-2.5 (3) MG/3ML nebulizer solution 3 mL (3 mLs Nebulization Given 02/23/19 0142)  ipratropium-albuterol (DUONEB) 0.5-2.5 (3) MG/3ML nebulizer solution 3 mL (3 mLs Nebulization Given 02/23/19 0142)  aspirin chewable tablet 324 mg (324 mg Oral Given 02/23/19 0152)     ED Discharge Orders         Ordered    predniSONE (DELTASONE) 10 MG tablet     02/23/19 0145           Note:  This document was prepared using Dragon voice recognition software and may include unintentional dictation errors.   Loleta Rose, MD 02/23/19 0370    Loleta Rose, MD 02/23/19 0300

## 2019-02-23 NOTE — Care Management Obs Status (Signed)
MEDICARE OBSERVATION STATUS NOTIFICATION   Patient Details  Name: Eric Lyons MRN: [id number] Date of Birth: [birth year]   Medicare Observation Status Notification Given:  Yes    Sherren Kerns, RN 02/23/2019, 4:17 PM

## 2019-02-23 NOTE — Progress Notes (Signed)
Advanced care plan. Purpose of the Encounter: CODE STATUS Parties in Attendance: Patient Patient's Decision Capacity: Good Subjective/Patient's story: Presented to emergency room for shortness of breath and wheezing Objective/Medical story Has COPD exacerbation needs nebulization therapy and Solu-Medrol Goals of care determination:  Advance care directives goals of care and treatment plan discussed For now patient wants everything done which includes CPR, intubation ventilator if the need arises CODE STATUS: Full code Time spent discussing advanced care planning: 16 minutes

## 2019-02-23 NOTE — Progress Notes (Signed)
[employer] [facility] [place] [facility]   PATIENT NAME: Eric Lyons    MR#:  [id number]  DATE OF BIRTH:  [birth year]  SUBJECTIVE:  CHIEF COMPLAINT:   Chief Complaint  Patient presents with  . Chest Pain  Patient seen and evaluated today No complaints of chest pain Has shortness of breath and wheezing No fever Uses BiPAP at bedtime  REVIEW OF SYSTEMS:    ROS  CONSTITUTIONAL: No documented fever. No fatigue, weakness. No weight gain, no weight loss.  EYES: No blurry or double vision.  ENT: No tinnitus. No postnasal drip. No redness of the oropharynx.  RESPIRATORY: Occasional cough, has wheeze, no hemoptysis. Has dyspnea.  CARDIOVASCULAR: No chest pain. No orthopnea. No palpitations. No syncope.  GASTROINTESTINAL: No nausea, no vomiting or diarrhea. No abdominal pain. No melena or hematochezia.  GENITOURINARY: No dysuria or hematuria.  ENDOCRINE: No polyuria or nocturia. No heat or cold intolerance.  HEMATOLOGY: No anemia. No bruising. No bleeding.  INTEGUMENTARY: No rashes. No lesions.  MUSCULOSKELETAL: No arthritis. No swelling. No gout.  NEUROLOGIC: No numbness, tingling, or ataxia. No seizure-type activity.  PSYCHIATRIC: No anxiety. No insomnia. No ADD.   DRUG ALLERGIES:   Allergies  Allergen Reactions  . Fentanyl Rash  . Prednisone Other (See Comments)    Irritable    VITALS:  Blood pressure (!) 147/77, pulse 89, temperature 98.4 F (36.9 C), temperature source Oral, resp. rate 20, height 5\' 4"  (1.626 m), weight 112.4 kg, SpO2 92 %.  PHYSICAL EXAMINATION:   Physical Exam  GENERAL:  53 y.o.-year-old patient lying in the bed with no acute distress.  EYES: Pupils equal, round, reactive to light and accommodation. No scleral icterus. Extraocular muscles intact.  HEENT: Head atraumatic, normocephalic. Oropharynx and nasopharynx clear.  NECK:  Supple, no jugular venous distention. No thyroid enlargement, no tenderness.  LUNGS: Decreased breath sounds  bilaterally, bilateral wheezing. No use of accessory muscles of respiration.  CARDIOVASCULAR: S1, S2 normal. No murmurs, rubs, or gallops.  ABDOMEN: Soft, nontender, nondistended. Bowel sounds present. No organomegaly or mass.  EXTREMITIES: No cyanosis, clubbing or edema b/l.    NEUROLOGIC: Cranial nerves II through XII are intact. No focal Motor or sensory deficits b/l.   PSYCHIATRIC: The patient is alert and oriented x 3.  SKIN: No obvious rash, lesion, or ulcer.   LABORATORY PANEL:   CBC Recent Labs  Lab 02/22/19 1909  WBC 8.8  HGB 15.3  HCT 49.3  PLT 263   ------------------------------------------------------------------------------------------------------------------ Chemistries  Recent Labs  Lab 02/22/19 1909  NA 140  K 3.4*  CL 99  CO2 32  GLUCOSE 151*  BUN 12  CREATININE 0.77  CALCIUM 9.1  AST 23  ALT 26  ALKPHOS 56  BILITOT 0.4   ------------------------------------------------------------------------------------------------------------------  Cardiac Enzymes Recent Labs  Lab 02/23/19 0153  TROPONINI <0.03   ------------------------------------------------------------------------------------------------------------------  RADIOLOGY:  Dg Chest 2 View  Result Date: 02/22/2019 CLINICAL DATA:  LEFT-sided chest pain this morning. EXAM: CHEST - 2 VIEW COMPARISON:  Chest x-ray dated 05/04/2017. FINDINGS: Heart size is upper normal, stable. Lungs are clear. No pleural effusion or pneumothorax seen. No acute or suspicious osseous finding. IMPRESSION: No active cardiopulmonary disease. No evidence of pneumonia or pulmonary edema. Electronically Signed   By: Bary Richard M.D.   On: 02/22/2019 19:35     ASSESSMENT AND PLAN:  53 year old male patient with history of COPD currently under hospitalist service for shortness of breath  -Acute on chronic respiratory failure with hypoxia Oxygen via  nasal cannula as needed BiPAP at bedtime Has a component of obesity  hypoventilation  -Acute COPD exacerbation IV Solu-Medrol and aggressive nebulization therapy Oral Zithromax antibiotics  -Atypical chest pain musculoskeletal etiology  -Obesity Weight loss reduction consult   All the records are reviewed and case discussed with Care Management/Social Worker. Management plans discussed with the patient, family and they are in agreement.  CODE STATUS: Full code  DVT Prophylaxis: SCDs  TOTAL TIME TAKING CARE OF THIS PATIENT: 45 minutes.   POSSIBLE D/C IN 1 DAYS, DEPENDING ON CLINICAL CONDITION.  Ihor Austin M.D on 02/23/2019 at 11:29 AM  Between 7am to 6pm - Pager - [phone]  After 6pm go to [email or web address] - password [email or web address] [facility]  [facility]  Office  [phone]  CC: Primary care physician; White, Arlyss Repress, NP  Note: This dictation was prepared with Dragon dictation along with smaller phrase technology. Any transcriptional errors that result from this process are unintentional.

## 2019-02-23 NOTE — Care Management Note (Signed)
Case Management Note  Patient Details  Name: TERICK HATHEWAY MRN: [id number] Date of Birth: 1966/04/09  Subjective/Objective:    Patient is from home alone.  He has been admitted with COPD exacerbation, CP.  He is current with his PCP.  Uses [facility] pharmacy and states he sometimes has difficulty affording his medications.  He is disabled and works part time.  Left message with financial counseling to stop by patient's room and open a Medicaid application.  No further needs identified at this time.                   Action/Plan:   Expected Discharge Date:                  Expected Discharge Plan:  Home w Home Health Services  In-House Referral:     Discharge planning Services  CM Consult  Post Acute Care Choice:    Choice offered to:     DME Arranged:    DME Agency:     HH Arranged:    HH Agency:     Status of Service:  Completed, signed off  If discussed at [employer] of Stay Meetings, dates discussed:    Additional Comments:  Sherren Kerns, RN 02/23/2019, 3:26 PM

## 2019-02-23 NOTE — H&P (Signed)
Eric Lyons is an 53 y.o. male.   Chief Complaint: Chest pain HPI: The patient with past medical history of asthma and COPD presents to the emergency department complaining of chest pain.  The patient reports that his nocturnal supplemental oxygen has not been alleviating his shortness of breath or chest pain tonight.  He also reports cough productive of scant white sputum as well as shortness of breath during the day for which she has had to use his supplemental oxygen occasionally.  The patient received multiple breathing treatments in the emergency department as well as Solu-Medrol and magnesium.  His work of breathing improved however his oxygen saturations remained 86% on room air which prompted the emergency department staff to call hospitalist service for admission.  Past Medical History:  Diagnosis Date  . Asthma   . COPD (chronic obstructive pulmonary disease) (HCC)     Past Surgical History:  Procedure Laterality Date  . ankle fracture surgery    . HERNIA REPAIR    . KNEE ARTHROSCOPY    . TONSILLECTOMY      Family History  Problem Relation Age of Onset  . Alcohol abuse Mother   . ALS Father    Social History:  reports that he has never smoked. He has never used smokeless tobacco. He reports that he does not drink alcohol or use drugs.  Allergies:  Allergies  Allergen Reactions  . Fentanyl Rash  . Prednisone Other (See Comments)    Irritable    Medications Prior to Admission  Medication Sig Dispense Refill  . albuterol (PROVENTIL HFA;VENTOLIN HFA) 108 (90 Base) MCG/ACT inhaler Inhale 2 puffs into the lungs every 6 (six) hours as needed for wheezing or shortness of breath. 1 Inhaler 2  . ciclopirox (PENLAC) 8 % solution Apply 1 application topically as needed.  1  . fluticasone (FLONASE) 50 MCG/ACT nasal spray INSTILL 2 SPRAYS INTO EACH NOSTRIL ONCE DAILY 16 g 3  . ipratropium-albuterol (DUONEB) 0.5-2.5 (3) MG/3ML SOLN Take 3 mLs by nebulization every 6 (six) hours.  DX: COPD DX Code: J44.9 360 mL 10  . naproxen (NAPROSYN) 500 MG tablet Take 1 tablet (500 mg total) by mouth 2 (two) times daily with a meal. 20 tablet 2  . tiotropium (SPIRIVA) 18 MCG inhalation capsule Place 1 capsule into inhaler and inhale 2 (two) times daily.    . COMBIVENT RESPIMAT 20-100 MCG/ACT AERS respimat INHALE 2 PUFFS BY MOUTH FOUR TIMES DAILY 4 g 3    Results for orders placed or performed during the hospital encounter of 02/23/19 (from the past 48 hour(s))  CBC with Differential     Status: None   Collection Time: 02/22/19  7:09 PM  Result Value Ref Range   WBC 8.8 4.0 - 10.5 K/uL   RBC 5.29 4.22 - 5.81 MIL/uL   Hemoglobin 15.3 13.0 - 17.0 g/dL   HCT 92.4 26.8 - 34.1 %   MCV 93.2 80.0 - 100.0 fL   MCH 28.9 26.0 - 34.0 pg   MCHC 31.0 30.0 - 36.0 g/dL   RDW 96.2 22.9 - 79.8 %   Platelets 263 150 - 400 K/uL   nRBC 0.0 0.0 - 0.2 %   Neutrophils Relative % 71 %   Neutro Abs 6.3 1.7 - 7.7 K/uL   Lymphocytes Relative 18 %   Lymphs Abs 1.6 0.7 - 4.0 K/uL   Monocytes Relative 7 %   Monocytes Absolute 0.7 0.1 - 1.0 K/uL   Eosinophils Relative 3 %  Eosinophils Absolute 0.2 0.0 - 0.5 K/uL   Basophils Relative 1 %   Basophils Absolute 0.0 0.0 - 0.1 K/uL   Immature Granulocytes 0 %   Abs Immature Granulocytes 0.03 0.00 - 0.07 K/uL    Comment: Performed at [facility], [address] [place], [place] [address]  Comprehensive metabolic panel     Status: Abnormal   Collection Time: 02/22/19  7:09 PM  Result Value Ref Range   Sodium 140 135 - 145 mmol/L   Potassium 3.4 (L) 3.5 - 5.1 mmol/L   Chloride 99 98 - 111 mmol/L   CO2 32 22 - 32 mmol/L   Glucose, Bld 151 (H) 70 - 99 mg/dL   BUN 12 6 - 20 mg/dL   Creatinine, Ser 2.72 0.61 - 1.24 mg/dL   Calcium 9.1 8.9 - 53.6 mg/dL   Total Protein 6.8 6.5 - 8.1 g/dL   Albumin 4.0 3.5 - 5.0 g/dL   AST 23 15 - 41 U/L   ALT 26 0 - 44 U/L   Alkaline Phosphatase 56 38 - 126 U/L   Total Bilirubin 0.4 0.3 - 1.2 mg/dL   GFR  calc non Af Amer >60 >60 mL/min   GFR calc Af Amer >60 >60 mL/min   Anion gap 9 5 - 15    Comment: Performed at [facility], [address] [place], [place] [address]  Troponin I - ONCE - STAT     Status: None   Collection Time: 02/22/19  7:09 PM  Result Value Ref Range   Troponin I <0.03 <0.03 ng/mL    Comment: Performed at [facility], [address] [place], [place] [address]  Troponin I - Once-Timed     Status: None   Collection Time: 02/23/19  1:53 AM  Result Value Ref Range   Troponin I <0.03 <0.03 ng/mL    Comment: Performed at [facility], [address], [place], [place] [address]   Dg Chest 2 View  Result Date: 02/22/2019 CLINICAL DATA:  LEFT-sided chest pain this morning. EXAM: CHEST - 2 VIEW COMPARISON:  Chest x-ray dated 05/04/2017. FINDINGS: Heart size is upper normal, stable. Lungs are clear. No pleural effusion or pneumothorax seen. No acute or suspicious osseous finding. IMPRESSION: No active cardiopulmonary disease. No evidence of pneumonia or pulmonary edema. Electronically Signed   By: Bary Richard M.D.   On: 02/22/2019 19:35    Review of Systems  Constitutional: Negative for chills and fever.  HENT: Negative for sore throat and tinnitus.   Eyes: Negative for blurred vision and redness.  Respiratory: Positive for shortness of breath. Negative for cough.   Cardiovascular: Positive for chest pain. Negative for palpitations, orthopnea and PND.  Gastrointestinal: Negative for abdominal pain, diarrhea, nausea and vomiting.  Genitourinary: Negative for dysuria, frequency and urgency.  Musculoskeletal: Negative for joint pain and myalgias.  Skin: Negative for rash.       No lesions  Neurological: Negative for speech change, focal weakness and weakness.  Endo/Heme/Allergies: Does not bruise/bleed easily.       No temperature intolerance  Psychiatric/Behavioral: Negative for depression and suicidal ideas.    Blood pressure (!)  147/89, pulse 83, temperature 97.9 F (36.6 C), temperature source Oral, resp. rate (!) 22, height 5\' 4"  (1.626 m), weight 102.1 kg, SpO2 91 %. Physical Exam  Vitals reviewed. Constitutional: He is oriented to person, place, and time. He appears well-developed and well-nourished. No distress.  HENT:  Head: Normocephalic.  Mouth/Throat: Oropharynx  is clear and moist.  Eyes: Pupils are equal, round, and reactive to light. Conjunctivae and EOM are normal. No scleral icterus.  Neck: Normal range of motion. Neck supple. No JVD present. No tracheal deviation present. No thyromegaly present.  Cardiovascular: Normal rate, regular rhythm and normal heart sounds. Exam reveals no gallop and no friction rub.  No murmur heard. Respiratory: Effort normal and breath sounds normal. No respiratory distress.  GI: Soft. Bowel sounds are normal. He exhibits no distension. There is no abdominal tenderness.  Genitourinary:    Genitourinary Comments: Deferred   Musculoskeletal: Normal range of motion.        General: No edema.  Lymphadenopathy:    He has no cervical adenopathy.  Neurological: He is alert and oriented to person, place, and time. No cranial nerve deficit.  Skin: Skin is warm and dry. No rash noted. No erythema.  Psychiatric: He has a normal mood and affect. His behavior is normal. Judgment and thought content normal.     Assessment/Plan This is a 53 year old male admitted for respiratory failure. 1.  Respiratory failure: Acute on chronic; with hypoxemia.  Continue scheduled breathing treatments.  Supplemental O2 as needed.  The patient also likely has a component of sleep apnea or obesity hypoventilation syndrome.  He may benefit from CPAP nightly will need testing as an outpatient 2.  COPD: With exacerbation.  Steroids as well as azithromycin for anti-inflammatory effect.  Continue Spiriva. 3.  Chest pain: Noncardiac.  Consistent with chest wall muscle strain secondary to increased work of  breathing. 4.  Morbid obesity: BMI is 42.5; encouraged healthy diet and exercise 5.  DVT prophylaxis: Lovenox 6.  GI prophylaxis: None The patient is a full code.  Time spent on admission orders and patient care approximately 45 minutes  Arnaldo Nataliamond,  Teigen Bellin S, MD 02/23/2019, 3:47 AM

## 2019-02-23 NOTE — ED Notes (Signed)
Nurse in to discharge pt. Pt's pulse O2 on 3L [place] is coming up as 89%. Pt stating he is still having difficulty taking a deep breath. Nurse put pt up to 4L and went and spoke with Dr. York Cerise about pt's breathing.

## 2019-02-23 NOTE — ED Notes (Signed)
ED TO INPATIENT HANDOFF REPORT  Name/Age/Gender Eric Lyons 53 y.o. male  Code Status Code Status History    Date Active Date Inactive Code Status Order ID Comments User Context   05/04/2017 2035 05/07/2017 1722 Full Code [id number]  Gracelyn Nurse, MD Inpatient   12/31/2016 1736 01/13/2017 2152 Full Code [id number]  Nelda Bucks, MD ED   12/06/2016 2023 12/08/2016 1715 Full Code [id number]  Houston Siren, MD Inpatient   11/13/2015 1458 11/15/2015 1746 Full Code [id number]  Eric Pollack, MD Inpatient      Home/SNF/Other Home  Chief Complaint Chest pain/sob  Level of Care/Admitting Diagnosis ED Disposition    ED Disposition Condition Comment   Admit  Hospital Area: [facility] [[id number]]  Level of Care: Med-Surg [16]  Diagnosis: Acute on chronic respiratory failure with hypoxemia [facility]) [[id number]]  Admitting Physician: Arnaldo Natal [[id number]]  Attending Physician: Arnaldo Natal [[id number]]  PT Class (Do Not Modify): Observation [104]  PT Acc Code (Do Not Modify): Observation [10022]       Medical History Past Medical History:  Diagnosis Date  . Asthma   . COPD (chronic obstructive pulmonary disease) (HCC)     Allergies Allergies  Allergen Reactions  . Fentanyl Rash  . Prednisone Other (See Comments)    Irritable    IV Location/Drains/Wounds Patient Lines/Drains/Airways Status   Active Line/Drains/Airways    Name:   Placement date:   Placement time:   Site:   Days:   Peripheral IV 02/23/19 Left Antecubital   02/23/19    0150    Antecubital   less than 1          Labs/Imaging Results for orders placed or performed during the hospital encounter of 02/23/19 (from the past 48 hour(s))  CBC with Differential     Status: None   Collection Time: 02/22/19  7:09 PM  Result Value Ref Range   WBC 8.8 4.0 - 10.5 K/uL   RBC 5.29 4.22 - 5.81 MIL/uL   Hemoglobin 15.3 13.0 - 17.0 g/dL   HCT 68.0 88.1 - 10.3 %   MCV 93.2 80.0 - 100.0 fL    MCH 28.9 26.0 - 34.0 pg   MCHC 31.0 30.0 - 36.0 g/dL   RDW 15.9 45.8 - 59.2 %   Platelets 263 150 - 400 K/uL   nRBC 0.0 0.0 - 0.2 %   Neutrophils Relative % 71 %   Neutro Abs 6.3 1.7 - 7.7 K/uL   Lymphocytes Relative 18 %   Lymphs Abs 1.6 0.7 - 4.0 K/uL   Monocytes Relative 7 %   Monocytes Absolute 0.7 0.1 - 1.0 K/uL   Eosinophils Relative 3 %   Eosinophils Absolute 0.2 0.0 - 0.5 K/uL   Basophils Relative 1 %   Basophils Absolute 0.0 0.0 - 0.1 K/uL   Immature Granulocytes 0 %   Abs Immature Granulocytes 0.03 0.00 - 0.07 K/uL    Comment: Performed at [facility], [address] [place], [place] [address]  Comprehensive metabolic panel     Status: Abnormal   Collection Time: 02/22/19  7:09 PM  Result Value Ref Range   Sodium 140 135 - 145 mmol/L   Potassium 3.4 (L) 3.5 - 5.1 mmol/L   Chloride 99 98 - 111 mmol/L   CO2 32 22 - 32 mmol/L   Glucose, Bld 151 (H) 70 - 99 mg/dL   BUN 12 6 - 20 mg/dL   Creatinine, Ser 2.86 0.61 - 1.24  mg/dL   Calcium 9.1 8.9 - 59.7 mg/dL   Total Protein 6.8 6.5 - 8.1 g/dL   Albumin 4.0 3.5 - 5.0 g/dL   AST 23 15 - 41 U/L   ALT 26 0 - 44 U/L   Alkaline Phosphatase 56 38 - 126 U/L   Total Bilirubin 0.4 0.3 - 1.2 mg/dL   GFR calc non Af Amer >60 >60 mL/min   GFR calc Af Amer >60 >60 mL/min   Anion gap 9 5 - 15    Comment: Performed at [facility], [address] [place], [place] [address]  Troponin I - ONCE - STAT     Status: None   Collection Time: 02/22/19  7:09 PM  Result Value Ref Range   Troponin I <0.03 <0.03 ng/mL    Comment: Performed at [facility], [address] [place], [place] [address]  Troponin I - Once-Timed     Status: None   Collection Time: 02/23/19  1:53 AM  Result Value Ref Range   Troponin I <0.03 <0.03 ng/mL    Comment: Performed at [facility], [address], [place], [place] [address]   Dg Chest 2 View  Result Date: 02/22/2019 CLINICAL DATA:  LEFT-sided chest pain this  morning. EXAM: CHEST - 2 VIEW COMPARISON:  Chest x-ray dated 05/04/2017. FINDINGS: Heart size is upper normal, stable. Lungs are clear. No pleural effusion or pneumothorax seen. No acute or suspicious osseous finding. IMPRESSION: No active cardiopulmonary disease. No evidence of pneumonia or pulmonary edema. Electronically Signed   By: Bary Richard M.D.   On: 02/22/2019 19:35    Pending Labs [employer] (From admission, onward)    Start     Ordered   Signed and Held  Creatinine, serum  (enoxaparin (LOVENOX)    CrCl >/= 30 ml/min)  Weekly,   R    Comments:  while on enoxaparin therapy    Signed and Held          Vitals/Pain Today's Vitals   02/23/19 0300 02/23/19 0325 02/23/19 0330 02/23/19 0415  BP: (!) 159/88  (!) 167/76   Pulse: 79  85 88  Resp: 17  20 18   Temp:      TempSrc:      SpO2: 97%  94% 93%  Weight:      Height:      PainSc:  6       Isolation Precautions No active isolations  Medications Medications  albuterol (PROVENTIL) (2.5 MG/3ML) 0.083% nebulizer solution 2.5 mg (2.5 mg Nebulization Given 02/23/19 0255)  methylPREDNISolone sodium succinate (SOLU-MEDROL) 125 mg/2 mL injection 125 mg (125 mg Intravenous Given 02/23/19 0151)  ipratropium-albuterol (DUONEB) 0.5-2.5 (3) MG/3ML nebulizer solution 3 mL (3 mLs Nebulization Given 02/23/19 0142)  ipratropium-albuterol (DUONEB) 0.5-2.5 (3) MG/3ML nebulizer solution 3 mL (3 mLs Nebulization Given 02/23/19 0142)  ipratropium-albuterol (DUONEB) 0.5-2.5 (3) MG/3ML nebulizer solution 3 mL (3 mLs Nebulization Given 02/23/19 0142)  aspirin chewable tablet 324 mg (324 mg Oral Given 02/23/19 0152)  magnesium sulfate IVPB 2 g 50 mL (0 g Intravenous Stopped 02/23/19 0430)    Mobility walks

## 2019-02-24 LAB — POTASSIUM: Potassium: 4.4 mmol/L (ref 3.5–5.1)

## 2019-02-24 MED ORDER — AZITHROMYCIN 250 MG PO TABS: ORAL_TABLET | ORAL | 0 refills | Status: AC

## 2019-02-24 MED ORDER — PREDNISONE 10 MG PO TABS: 10.0000 mg | ORAL_TABLET | Freq: Every day | ORAL | 0 refills | Status: DC

## 2019-02-24 NOTE — Progress Notes (Signed)
paitent alert and oriented, vss, no complaints of pain.  Escorted out of hospital via wheelchair by nursing staff.  D/c telemetry and PIV.

## 2019-02-24 NOTE — Discharge Summary (Signed)
[employer] - [place] at [facility]   PATIENT NAME: Eric Lyons    MR#:  [id number]  DATE OF BIRTH:  04-12-1966  DATE OF ADMISSION:  02/23/2019 ADMITTING PHYSICIAN: Arnaldo Natal, MD  DATE OF DISCHARGE: 02/24/2019  PRIMARY CARE PHYSICIAN: Titus Mould, NP   ADMISSION DIAGNOSIS:  COPD exacerbation (HCC) [J44.1] Acute on chronic respiratory failure with hypoxemia (HCC) [J96.21] Chest pain, unspecified type [R07.9]  DISCHARGE DIAGNOSIS:  Active Problems:   Acute on chronic respiratory failure with hypoxemia (HCC) Acute COPD exacerbation Atypical chest pain Obesity hypoventilation  SECONDARY DIAGNOSIS:   Past Medical History:  Diagnosis Date  . Asthma   . COPD (chronic obstructive pulmonary disease) (HCC)      ADMITTING HISTORY The patient with past medical history of asthma and COPD presents to the emergency department complaining of chest pain.  The patient reports that his nocturnal supplemental oxygen has not been alleviating his shortness of breath or chest pain tonight.  He also reports cough productive of scant white sputum as well as shortness of breath during the day for which she has had to use his supplemental oxygen occasionally.  The patient received multiple breathing treatments in the emergency department as well as Solu-Medrol and magnesium.  His work of breathing improved however his oxygen saturations remained 86% on room air which prompted the emergency department staff to call hospitalist service for admission.  HOSPITAL COURSE:  Patient was admitted to medical floor with cardiac monitoring.  Patient received oxygen by nasal cannula and CPAP at bedtime.  Was started on IV Solu-Medrol and aggressive nebulization therapy.  Patient received oral Zithromax antibiotic.  Chest pain was musculoskeletal in etiology.  Weight loss reduction and counseling given to the patient.  Shortness of breath improved.  Cough decreased.  Wheezing  resolved.  Patient will be discharged home on oral tapering dose of steroids and Zithromax antibiotic.  CONSULTS OBTAINED:    DRUG ALLERGIES:   Allergies  Allergen Reactions  . Fentanyl Rash  . Prednisone Other (See Comments)    Irritable    DISCHARGE MEDICATIONS:   Allergies as of 02/24/2019      Reactions   Fentanyl Rash   Prednisone Other (See Comments)   Irritable      Medication List    STOP taking these medications   predniSONE 10 MG (21) Tbpk tablet Commonly known as:  STERAPRED UNI-PAK 21 TAB Replaced by:  predniSONE 10 MG tablet     TAKE these medications   albuterol 108 (90 Base) MCG/ACT inhaler Commonly known as:  PROVENTIL HFA;VENTOLIN HFA Inhale 2 puffs into the lungs every 6 (six) hours as needed for wheezing or shortness of breath.   azithromycin 250 MG tablet Commonly known as:  ZITHROMAX Z-PAK Take 2 tablets (500 mg) on  Day 1,  followed by 1 tablet (250 mg) once daily on Days 2 through 5.   ciclopirox 8 % solution Commonly known as:  PENLAC Apply 1 application topically as needed.   fluticasone 50 MCG/ACT nasal spray Commonly known as:  FLONASE INSTILL 2 SPRAYS INTO EACH NOSTRIL ONCE DAILY   ipratropium-albuterol 0.5-2.5 (3) MG/3ML Soln Commonly known as:  DUONEB Take 3 mLs by nebulization every 6 (six) hours. DX: COPD DX Code: J44.9   COMBIVENT RESPIMAT 20-100 MCG/ACT Aers respimat Generic drug:  Ipratropium-Albuterol INHALE 2 PUFFS BY MOUTH FOUR TIMES DAILY   naproxen 500 MG tablet Commonly known as:  NAPROSYN Take 1 tablet (500 mg total) by mouth 2 (two)  times daily with a meal.   predniSONE 10 MG tablet Commonly known as:  DELTASONE Take 1 tablet (10 mg total) by mouth daily. Label  & dispense according to the schedule below.  6 tablets day one, then 5 table day 2, then 4 tablets day 3, then 3 tablets day 4, 2 tablets day 5, then 1 tablet day 6, then stop Replaces:  predniSONE 10 MG (21) Tbpk tablet   tiotropium 18 MCG inhalation  capsule Commonly known as:  SPIRIVA Place 1 capsule into inhaler and inhale 2 (two) times daily.       Today  Patient seen today No shortness of breath No wheezing No chest pain Hemodynamically stable  VITAL SIGNS:  Blood pressure (!) 158/96, pulse 87, temperature 97.8 F (36.6 C), temperature source Oral, resp. rate 20, height  (1.626 m), weight 111.7 kg, SpO2 96 %.  I/O:    Intake/Output Summary (Last 24 hours) at 02/24/2019 1300 Last data filed at 02/24/2019 0700 Gross per 24 hour  Intake 360 ml  Output 2200 ml  Net -1840 ml    PHYSICAL EXAMINATION:  Physical Exam  GENERAL:  53 y.o.-year-old patient lying in the bed with no acute distress.  LUNGS: Normal breath sounds bilaterally, no wheezing, rales,rhonchi or crepitation. No use of accessory muscles of respiration.  CARDIOVASCULAR: S1, S2 normal. No murmurs, rubs, or gallops.  ABDOMEN: Soft, non-tender, non-distended. Bowel sounds present. No organomegaly or mass.  NEUROLOGIC: Moves all 4 extremities. PSYCHIATRIC: The patient is alert and oriented x 3.  SKIN: No obvious rash, lesion, or ulcer.   DATA REVIEW:   CBC Recent Labs  Lab 02/22/19 1909  WBC 8.8  HGB 15.3  HCT 49.3  PLT 263    Chemistries  Recent Labs  Lab 02/22/19 1909 02/24/19 0611  NA 140  --   K 3.4* 4.4  CL 99  --   CO2 32  --   GLUCOSE 151*  --   BUN 12  --   CREATININE 0.77  --   CALCIUM 9.1  --   AST 23  --   ALT 26  --   ALKPHOS 56  --   BILITOT 0.4  --     Cardiac Enzymes Recent Labs  Lab 02/23/19 0153  TROPONINI <0.03    Microbiology Results  Results for orders placed or performed during the hospital encounter of 02/23/19  MRSA PCR Screening     Status: Abnormal   Collection Time: 02/23/19  5:41 AM  Result Value Ref Range Status   MRSA by PCR POSITIVE (A) NEGATIVE Final    Comment:        The GeneXpert MRSA Assay (FDA approved for NASAL specimens only), is one component of a comprehensive MRSA  colonization surveillance program. It is not intended to diagnose MRSA infection nor to guide or monitor treatment for MRSA infections. RESULT CALLED TO, READ BACK BY AND VERIFIED WITH: SHERRY [facility] 02/23/2019 AT 1610 MU Performed at [facility], [address] [place], [place] [address]     RADIOLOGY:  Dg Chest 2 View  Result Date: 02/22/2019 CLINICAL DATA:  LEFT-sided chest pain this morning. EXAM: CHEST - 2 VIEW COMPARISON:  Chest x-ray dated 05/04/2017. FINDINGS: Heart size is upper normal, stable. Lungs are clear. No pleural effusion or pneumothorax seen. No acute or suspicious osseous finding. IMPRESSION: No active cardiopulmonary disease. No evidence of pneumonia or pulmonary edema. Electronically Signed   By: Bary Richard M.D.   On: 02/22/2019  19:35    Follow up with PCP in 1 week.  Management plans discussed with the patient, family and they are in agreement.  CODE STATUS: Full code    Code Status Orders  (From admission, onward)         Start     Ordered   02/23/19 0457  Full code  Continuous     02/23/19 0456        Code Status History    Date Active Date Inactive Code Status Order ID Comments User Context   05/04/2017 2035 05/07/2017 1722 Full Code [id number]  Gracelyn Nurse, MD Inpatient   12/31/2016 1736 01/13/2017 2152 Full Code [id number]  Nelda Bucks, MD ED   12/06/2016 2023 12/08/2016 1715 Full Code [id number]  Houston Siren, MD Inpatient   11/13/2015 1458 11/15/2015 1746 Full Code [id number]  Shaune Pollack, MD Inpatient      TOTAL TIME TAKING CARE OF THIS PATIENT ON DAY OF DISCHARGE: more than 35 minutes.   Ihor Austin M.D on 02/24/2019 at 1:00 PM  Between 7am to 6pm - Pager - [phone]  After 6pm go to [email or web address] - password [email or web address] [facility]  [facility]  Office  [phone]  CC: Primary care physician; White, Arlyss Repress, NP  Note: This dictation was prepared with Dragon dictation along with smaller phrase  technology. Any transcriptional errors that result from this process are unintentional.

## 2019-05-18 ENCOUNTER — Telehealth: Payer: Self-pay

## 2019-05-18 NOTE — Telephone Encounter (Signed)
Received paper refill authorization request from [facility] [address] for duoneb, stating it needed a dx code attached. Spoke to pharmacy tech that we have not seen this patient since 2018 and did not request this refill. Also the last prescription that we did give (08/06/2018) did have COPD J44.9 on the prescription. This patient will need to be seen before we approve any further refills.

## 2019-07-05 ENCOUNTER — Other Ambulatory Visit: Payer: Self-pay

## 2019-07-05 ENCOUNTER — Emergency Department
Admission: EM | Admit: 2019-07-05 | Discharge: 2019-07-06 | Disposition: A | Discharge status: Other Acute Inpatient Hospital | Payer: Medicare Other | Attending: Emergency Medicine | Admitting: Emergency Medicine

## 2019-07-05 ENCOUNTER — Emergency Department: Payer: Medicare Other

## 2019-07-05 DIAGNOSIS — J45909 Unspecified asthma, uncomplicated: Secondary | ICD-10-CM | POA: Insufficient documentation

## 2019-07-05 DIAGNOSIS — Y939 Activity, unspecified: Secondary | ICD-10-CM | POA: Diagnosis not present

## 2019-07-05 DIAGNOSIS — W230XXA Caught, crushed, jammed, or pinched between moving objects, initial encounter: Secondary | ICD-10-CM | POA: Diagnosis not present

## 2019-07-05 DIAGNOSIS — T1490XA Injury, unspecified, initial encounter: Secondary | ICD-10-CM

## 2019-07-05 DIAGNOSIS — S61219A Laceration without foreign body of unspecified finger without damage to nail, initial encounter: Secondary | ICD-10-CM

## 2019-07-05 DIAGNOSIS — S61401A Unspecified open wound of right hand, initial encounter: Secondary | ICD-10-CM

## 2019-07-05 DIAGNOSIS — J449 Chronic obstructive pulmonary disease, unspecified: Secondary | ICD-10-CM | POA: Insufficient documentation

## 2019-07-05 DIAGNOSIS — S66821A Laceration of other specified muscles, fascia and tendons at wrist and hand level, right hand, initial encounter: Secondary | ICD-10-CM | POA: Diagnosis not present

## 2019-07-05 DIAGNOSIS — Z20828 Contact with and (suspected) exposure to other viral communicable diseases: Secondary | ICD-10-CM | POA: Insufficient documentation

## 2019-07-05 DIAGNOSIS — S61217A Laceration without foreign body of left little finger without damage to nail, initial encounter: Secondary | ICD-10-CM | POA: Diagnosis present

## 2019-07-05 DIAGNOSIS — Y929 Unspecified place or not applicable: Secondary | ICD-10-CM | POA: Insufficient documentation

## 2019-07-05 DIAGNOSIS — Y999 Unspecified external cause status: Secondary | ICD-10-CM | POA: Diagnosis not present

## 2019-07-05 LAB — COMPREHENSIVE METABOLIC PANEL
ALT: 31 U/L (ref 0–44)
AST: 27 U/L (ref 15–41)
Albumin: 4.3 g/dL (ref 3.5–5.0)
Alkaline Phosphatase: 57 U/L (ref 38–126)
Anion gap: 10 (ref 5–15)
BUN: 19 mg/dL (ref 6–20)
CO2: 28 mmol/L (ref 22–32)
Calcium: 9.3 mg/dL (ref 8.9–10.3)
Chloride: 101 mmol/L (ref 98–111)
Creatinine, Ser: 0.72 mg/dL (ref 0.61–1.24)
GFR calc Af Amer: >60 mL/min (ref >60)
GFR calc non Af Amer: >60 mL/min (ref >60)
Glucose, Bld: 101 mg/dL — ABNORMAL HIGH (ref 70–99)
Potassium: 4.4 mmol/L (ref 3.5–5.1)
Sodium: 139 mmol/L (ref 135–145)
Total Bilirubin: 0.5 mg/dL (ref 0.3–1.2)
Total Protein: 7.7 g/dL (ref 6.5–8.1)

## 2019-07-05 LAB — SARS CORONAVIRUS 2 BY RT PCR (HOSPITAL ORDER, PERFORMED IN ~~LOC~~ HOSPITAL LAB): SARS Coronavirus 2: NEGATIVE

## 2019-07-05 LAB — CBC WITH DIFFERENTIAL/PLATELET
Abs Immature Granulocytes: 0.05 10*3/uL (ref 0.00–0.07)
Basophils Absolute: 0 10*3/uL (ref 0.0–0.1)
Basophils Relative: 0 %
Eosinophils Absolute: 0.3 10*3/uL (ref 0.0–0.5)
Eosinophils Relative: 4 %
HCT: 49 % (ref 39.0–52.0)
Hemoglobin: 15.6 g/dL (ref 13.0–17.0)
Immature Granulocytes: 1 %
Lymphocytes Relative: 21 %
Lymphs Abs: 2.1 10*3/uL (ref 0.7–4.0)
MCH: 29.8 pg (ref 26.0–34.0)
MCHC: 31.8 g/dL (ref 30.0–36.0)
MCV: 93.5 fL (ref 80.0–100.0)
Monocytes Absolute: 0.8 10*3/uL (ref 0.1–1.0)
Monocytes Relative: 8 %
Neutro Abs: 6.4 10*3/uL (ref 1.7–7.7)
Neutrophils Relative %: 66 %
Platelets: 304 10*3/uL (ref 150–400)
RBC: 5.24 MIL/uL (ref 4.22–5.81)
RDW: 13.2 % (ref 11.5–15.5)
WBC: 9.7 10*3/uL (ref 4.0–10.5)
nRBC: 0 % (ref 0.0–0.2)

## 2019-07-05 MED ORDER — CEFAZOLIN SODIUM-DEXTROSE 1-4 GM/50ML-% IV SOLN: 1.0000 g | Freq: Once | INTRAVENOUS | Status: DC

## 2019-07-05 MED ORDER — HYDROMORPHONE HCL 1 MG/ML IJ SOLN
1.0000 mg | Freq: Once | INTRAMUSCULAR | Status: AC
  Administered 2019-07-05: 1 mg via INTRAVENOUS
  Filled 2019-07-05: qty 1

## 2019-07-05 MED ORDER — HYDROMORPHONE HCL 1 MG/ML IJ SOLN
1.0000 mg | Freq: Once | INTRAMUSCULAR | Status: AC
  Administered 2019-07-05: 23:00:00 1 mg via INTRAVENOUS
  Filled 2019-07-05: qty 1

## 2019-07-05 MED ORDER — CEFAZOLIN SODIUM-DEXTROSE 1-4 GM/50ML-% IV SOLN
1.0000 g | Freq: Once | INTRAVENOUS | Status: AC
  Administered 2019-07-05: 1 g via INTRAVENOUS
  Filled 2019-07-05 (×2): qty 50

## 2019-07-05 NOTE — ED Provider Notes (Addendum)
[facility] Emergency Department Provider Note       Time seen: ----------------------------------------- 8:12 PM on 07/05/2019 -----------------------------------------   I have reviewed the triage vital signs and the nursing notes.  HISTORY   Chief Complaint Finger Injury and Laceration    HPI Eric Lyons is a 53 y.o. male with a history of asthma, COPD who presents to the ED for complex right hand injury.  Patient states his pinky finger on his right hand was caught in a condenser fan.  This reportedly moves at over thousand RPMs.  Patient states he has normal sensation of the pinky finger but cannot extend it at all.  He also sustained lacerations around the pinky finger on that same hand.  The event occurred just prior to arrival.  Pain is 5 out of 10.  Past Medical History:  Diagnosis Date  . Asthma   . COPD (chronic obstructive pulmonary disease) [facility](HCC)     Patient Active Problem List   Diagnosis Date Noted  . Acute on chronic respiratory failure with hypoxemia (HCC) 02/23/2019  . Anxiety 05/27/2017  . Acute on chronic respiratory failure (HCC) 05/04/2017  . Acute respiratory failure (HCC) 12/31/2016  . COPD, severe (HCC) 12/12/2016  . Asthma exacerbation 12/06/2016  . COPD exacerbation (HCC) 11/13/2015  . Hypoxia 11/13/2015  . S/P ankle fusion 08/05/2012  . Obesity, unspecified 07/09/2012  . Post-traumatic arthritis of ankle, right 05/15/2012  . Chronic ankle pain 03/11/2012  . GERD (gastroesophageal reflux disease) 09/21/2011    Past Surgical History:  Procedure Laterality Date  . ankle fracture surgery    . HERNIA REPAIR    . KNEE ARTHROSCOPY    . TONSILLECTOMY      Allergies Fentanyl and Prednisone  Social History Social History   Tobacco Use  . Smoking status: Never Smoker  . Smokeless tobacco: Never Used  Substance Use Topics  . Alcohol use: No  . Drug use: No   Review of Systems Constitutional: Negative for  fever. Cardiovascular: Negative for chest pain. Respiratory: Negative for shortness of breath. Gastrointestinal: Negative for abdominal pain Musculoskeletal: Positive for right hand pain Skin: Positive for right hand and fifth digit lacerations Neurological: Negative for headaches, focal weakness or numbness.  All systems negative/normal/unremarkable except as stated in the HPI  ____________________________________________   PHYSICAL EXAM:  VITAL SIGNS: ED Triage Vitals  Enc Vitals Group     BP 07/05/19 1747 (!) 181/90     Pulse Rate 07/05/19 1747 93     Resp 07/05/19 1747 18     Temp 07/05/19 1747 98.9 F (37.2 C)     Temp Source 07/05/19 1747 Oral     SpO2 07/05/19 1747 95 %     Weight 07/05/19 1748 245 lb (111.1 kg)     Height 07/05/19 1748 5\' 3"  (1.6 m)     Head Circumference --      Peak Flow --      Pain Score 07/05/19 1747 5     Pain Loc --      Pain Edu? --      Excl. in GC? --    Constitutional: Alert and oriented.  Mild distress from pain Cardiovascular: Normal rate, regular rhythm. No murmurs, rubs, or gallops. Respiratory: Normal respiratory effort without tachypnea nor retractions. Breath sounds are clear and equal bilaterally. No wheezes/rales/rhonchi. Musculoskeletal: Patient has extensive lacerations along the lateral/palmar and dorsal/medial aspect of his right fifth digit.  Lacerations extend deeply into the hand over the medial  and lateral aspect of the fifth metacarpal.  Obvious extensor tendon laceration with unopposed flexion of the fifth digit.  He has normal sensory function with good capillary refill at this time Neurologic: Inability to extend the right fifth digit with unopposed flexion.  Normal sensory function is noted. Skin: Extensive lacerations around the right fifth digit are noted ____________________________________________  ED COURSE:  As part of my medical decision making, I reviewed the following data within the electronic medical  record:  History obtained from family if available, nursing notes, old chart and ekg, as well as notes from prior ED visits. Patient presented for right hand injury, we will assess with labs and imaging as indicated at this time.   Procedures  SYE SCHROEPFER was evaluated in Emergency Department on 07/05/2019 for the symptoms described in the history of present illness. He was evaluated in the context of the global COVID-19 pandemic, which necessitated consideration that the patient might be at risk for infection with the SARS-CoV-2 virus that causes COVID-19. Institutional protocols and algorithms that pertain to the evaluation of patients at risk for COVID-19 are in a state of rapid change based on information released by regulatory bodies including the CDC and federal and state organizations. These policies and algorithms were followed during the patient's care in the ED.  ____________________________________________   LABS (pertinent positives/negatives)  Labs Reviewed  CBC WITH DIFFERENTIAL/PLATELET  COMPREHENSIVE METABOLIC PANEL    RADIOLOGY Images were viewed by me Right hand x-ray IMPRESSION: 1. Comminuted fracture of the distal fifth metacarpal bone with mild impaction and palmar angulation without significant displacement. 2. Probable dislocation at the fifth proximal interphalangeal joint. 3. Radiopaque foreign body within the soft tissues adjacent to the third proximal interphalangeal joint.  ____________________________________________   DIFFERENTIAL DIAGNOSIS   Laceration, tendon injury, fracture  FINAL ASSESSMENT AND PLAN  Fifth metacarpal fracture, complex finger laceration involving the extensor tendon   Plan: The patient had presented for right fifth digit injury. Patient's labs are still pending at this time. Patient's imaging is dictated as above.  Patient will require hand surgery for repair.  He was given IV Ancef 1 g here.  We will discuss with the [facility]  transfer center for hand surgery.  He has been splinted in extension for the time being.   Laurence Aly, MD    Note: This note was generated in part or whole with voice recognition software. Voice recognition is usually quite accurate but there are transcription errors that can and very often do occur. I apologize for any typographical errors that were not detected and corrected.     Earleen Newport, MD 07/05/19 2018    Earleen Newport, MD 07/05/19 2028

## 2019-07-05 NOTE — ED Notes (Signed)
EMTALA and Medical Necessity documentation reviewed at this time and noted to be complete per policy. 

## 2019-07-05 NOTE — ED Provider Notes (Signed)
Patient accepted in transfer to [facility] by Christella Scheuermann, MD 07/05/19 [phone]

## 2019-07-05 NOTE — Progress Notes (Signed)
  Subjective:  Called by Dr. Lenise Arena regarding this 53 year old right-hand-dominant male who injured his right small finger in a condenser fan at work today.  Dr. Jimmye Norman reports extensive lacerations of the right small finger with flexor tendon injury.    Objective:   VITALS:   Vitals:   07/05/19 1747 07/05/19 1748  BP: (!) 181/90   Pulse: 93   Resp: 18   Temp: 98.9 F (37.2 C)   TempSrc: Oral   SpO2: 95%   Weight:  111.1 kg  Height:  5\' 3"  (1.6 m)    LABS  No results found for this or any previous visit (from the past 24 hour(s)).  Dg Hand Complete Right  Result Date: 07/05/2019 CLINICAL DATA:  Laceration to the right hand. EXAM: RIGHT HAND - COMPLETE 3+ VIEW COMPARISON:  None. FINDINGS: Comminuted fracture of the distal fifth metacarpal bone with mild impaction and palmar angulation without significant displacement. Probable dislocation at the fifth proximal interphalangeal joint. Radiopaque foreign body within the soft tissues adjacent to the third proximal interphalangeal joint. Soft tissue swelling. IMPRESSION: 1. Comminuted fracture of the distal fifth metacarpal bone with mild impaction and palmar angulation without significant displacement. 2. Probable dislocation at the fifth proximal interphalangeal joint. 3. Radiopaque foreign body within the soft tissues adjacent to the third proximal interphalangeal joint. Electronically Signed   By: Fidela Salisbury M.D.   On: 07/05/2019 20:01    Assessment/Plan:     Active Problems:   * No active hospital problems. *  I have reviewed the patient's right hand x-rays which shows a comminuted fifth metacarpal fracture and possible dislocation of the right fifth DIP joint.  The lacerations of the right small finger involve a flexor tendon injury per Dr. Jimmye Norman.  I am recommending transfer this patient to an institution with a hand specialist who can provide surgical treatment for this open tendon injury/fracture  acutely.    Thornton Park , MD 07/05/2019, 8:17 PM

## 2019-07-05 NOTE — ED Notes (Signed)
Report given to Daniel RN

## 2019-07-05 NOTE — ED Triage Notes (Signed)
Laceration to right hand, pinky with condenser fan. Reports tetanus shot UTD.

## 2019-07-06 MED ORDER — HYDROMORPHONE HCL 1 MG/ML IJ SOLN
1.0000 mg | Freq: Once | INTRAMUSCULAR | Status: AC
  Administered 2019-07-06: 1 mg via INTRAVENOUS
  Filled 2019-07-06: qty 1

## 2019-08-24 ENCOUNTER — Other Ambulatory Visit: Payer: Self-pay | Admitting: Pulmonary Disease

## 2020-04-06 ENCOUNTER — Encounter: Payer: Self-pay | Admitting: Internal Medicine

## 2020-04-06 NOTE — Progress Notes (Signed)
Received 2 letters that were mailed to patient back in the mail due to patient no longer at that address. Patient was last seen in 2018. Will need to verify address if he returns. Nothing further needed at this time.

## 2020-06-30 DIAGNOSIS — Z0100 Encounter for examination of eyes and vision without abnormal findings: Secondary | ICD-10-CM | POA: Diagnosis not present

## 2020-07-08 DIAGNOSIS — J449 Chronic obstructive pulmonary disease, unspecified: Secondary | ICD-10-CM | POA: Diagnosis not present

## 2020-07-12 ENCOUNTER — Other Ambulatory Visit: Payer: Self-pay

## 2020-07-12 ENCOUNTER — Encounter: Payer: Self-pay | Admitting: Emergency Medicine

## 2020-07-12 ENCOUNTER — Emergency Department: Payer: Medicare HMO

## 2020-07-12 DIAGNOSIS — W010XXA Fall on same level from slipping, tripping and stumbling without subsequent striking against object, initial encounter: Secondary | ICD-10-CM | POA: Diagnosis not present

## 2020-07-12 DIAGNOSIS — J449 Chronic obstructive pulmonary disease, unspecified: Secondary | ICD-10-CM | POA: Diagnosis not present

## 2020-07-12 DIAGNOSIS — Y929 Unspecified place or not applicable: Secondary | ICD-10-CM | POA: Diagnosis not present

## 2020-07-12 DIAGNOSIS — Y939 Activity, unspecified: Secondary | ICD-10-CM | POA: Diagnosis not present

## 2020-07-12 DIAGNOSIS — S80912A Unspecified superficial injury of left knee, initial encounter: Secondary | ICD-10-CM | POA: Diagnosis present

## 2020-07-12 DIAGNOSIS — Z79899 Other long term (current) drug therapy: Secondary | ICD-10-CM | POA: Diagnosis not present

## 2020-07-12 DIAGNOSIS — S8392XA Sprain of unspecified site of left knee, initial encounter: Secondary | ICD-10-CM | POA: Diagnosis not present

## 2020-07-12 DIAGNOSIS — Y999 Unspecified external cause status: Secondary | ICD-10-CM | POA: Diagnosis not present

## 2020-07-12 DIAGNOSIS — M25462 Effusion, left knee: Secondary | ICD-10-CM | POA: Diagnosis not present

## 2020-07-12 NOTE — ED Triage Notes (Signed)
Pt to triage via w/c with no distress noted; st x 5 days having left knee pain; st he slipped in rain and having pain since

## 2020-07-13 ENCOUNTER — Emergency Department
Admission: EM | Admit: 2020-07-13 | Discharge: 2020-07-13 | Disposition: A | Discharge status: Home / Self Care | Payer: Medicare HMO | Attending: Emergency Medicine | Admitting: Emergency Medicine

## 2020-07-13 DIAGNOSIS — S8392XA Sprain of unspecified site of left knee, initial encounter: Secondary | ICD-10-CM

## 2020-07-13 MED ORDER — OXYCODONE HCL 5 MG PO TABS: 5.0000 mg | ORAL_TABLET | Freq: Three times a day (TID) | ORAL | 0 refills | Status: AC | PRN

## 2020-07-13 MED ORDER — OXYCODONE HCL 5 MG PO TABS
5.0000 mg | ORAL_TABLET | Freq: Once | ORAL | Status: AC
  Administered 2020-07-13: 5 mg via ORAL
  Filled 2020-07-13: qty 1

## 2020-07-13 NOTE — ED Notes (Signed)
Pt ambulates with Dr. Fuller Plan, pt able to ambulate on own with limp from left leg pain

## 2020-07-13 NOTE — ED Notes (Signed)
Pt sitting on side of bed, pt reports that he tripped and fell several days ago and since has experienced pain on let knee. States he heard a pop when he fell, reports taking tylenol with no relief at home and states he came today due to fear he may have "tore something."

## 2020-07-13 NOTE — Discharge Instructions (Addendum)
Discuss with your primary care doctor or call the orthopedic doctor about potentially getting an MRI to make sure there is no evidence of meniscus or ACL injury given your continued discomfort especially with twisting motion.  Take the oxycodone to help with your pain.  Do not drive, drink alcohol or work while on oxycodone.  Return to the ER if you develop any other concerns    IMPRESSION: 1. Mild degenerative change.  No fracture or dislocation. 2. Small joint effusion.

## 2020-07-13 NOTE — ED Provider Notes (Signed)
[facility] Emergency Department Provider Note  ____________________________________________   First MD Initiated Contact with Patient 07/13/20 [phone]     (approximate)  I have reviewed the triage vital signs and the nursing notes.   HISTORY  Chief Complaint Knee Pain    HPI Eric Lyons is a 54 y.o. male with COPD and asthma on chronic oxygen who comes in with left knee pain.  Patient reports that 1 week ago he tripped and fell on a pallet and landed on his knee.  He states that he has been taking Tylenol and ibuprofen without any relief that he is worried he could have torn something.  He states the pain is moderate, constant, worse with walking, better at rest.  He still able to ambulate.  Patient dates the pain is worse with a twisting motion.      Past Medical History:  Diagnosis Date  . Asthma   . COPD (chronic obstructive pulmonary disease) [facility])     Patient Active Problem List   Diagnosis Date Noted  . Acute on chronic respiratory failure with hypoxemia (HCC) 02/23/2019  . Anxiety 05/27/2017  . Acute on chronic respiratory failure (HCC) 05/04/2017  . Acute respiratory failure (HCC) 12/31/2016  . COPD, severe (HCC) 12/12/2016  . Asthma exacerbation 12/06/2016  . COPD exacerbation (HCC) 11/13/2015  . Hypoxia 11/13/2015  . S/P ankle fusion 08/05/2012  . Obesity, unspecified 07/09/2012  . Post-traumatic arthritis of ankle, right 05/15/2012  . Chronic ankle pain 03/11/2012  . GERD (gastroesophageal reflux disease) 09/21/2011    Past Surgical History:  Procedure Laterality Date  . ankle fracture surgery    . HERNIA REPAIR    . KNEE ARTHROSCOPY    . TONSILLECTOMY      Prior to Admission medications   Medication Sig Start Date End Date Taking? Authorizing Provider  albuterol (PROVENTIL HFA;VENTOLIN HFA) 108 (90 Base) MCG/ACT inhaler Inhale 2 puffs into the lungs every 6 (six) hours as needed for wheezing or shortness of breath.  12/08/16   Houston Siren, MD  ciclopirox (PENLAC) 8 % solution Apply 1 application topically as needed. 11/02/18   [provider]  COMBIVENT RESPIMAT 20-100 MCG/ACT AERS respimat INHALE 2 PUFFS BY MOUTH FOUR TIMES DAILY 09/24/18   Merwyn Katos, MD  fluticasone [facility]) 50 MCG/ACT nasal spray INSTILL 2 SPRAYS INTO EACH NOSTRIL ONCE DAILY 06/08/18   Merwyn Katos, MD  ipratropium-albuterol (DUONEB) 0.5-2.5 (3) MG/3ML SOLN Take 3 mLs by nebulization every 6 (six) hours. DX: COPD DX Code: J44.9 08/06/18   Merwyn Katos, MD  naproxen (NAPROSYN) 500 MG tablet Take 1 tablet (500 mg total) by mouth 2 (two) times daily with a meal. 11/30/18   Jene Every, MD  predniSONE (DELTASONE) 10 MG tablet Take 1 tablet (10 mg total) by mouth daily. Label  & dispense according to the schedule below.  6 tablets day one, then 5 table day 2, then 4 tablets day 3, then 3 tablets day 4, 2 tablets day 5, then 1 tablet day 6, then stop 02/24/19   Ihor Austin, MD    Allergies Fentanyl and Prednisone  Family History  Problem Relation Age of Onset  . Alcohol abuse Mother   . ALS Father     Social History Social History   Tobacco Use  . Smoking status: Never Smoker  . Smokeless tobacco: Never Used  Substance Use Topics  . Alcohol use: No  . Drug use: No  Review of Systems Constitutional: No fever/chills Eyes: No visual changes. ENT: No sore throat. Cardiovascular: Denies chest pain. Respiratory: Denies shortness of breath. Gastrointestinal: No abdominal pain.  No nausea, no vomiting.  No diarrhea.  No constipation. Genitourinary: Negative for dysuria. Musculoskeletal: Negative for back pain.  Knee pain Skin: Negative for rash. Neurological: Negative for headaches, focal weakness or numbness. All other ROS negative ____________________________________________   PHYSICAL EXAM:  VITAL SIGNS: ED Triage Vitals  Enc Vitals Group     BP 07/12/20 2319 124/69     Pulse Rate  07/12/20 2319 87     Resp 07/12/20 2319 18     Temp 07/12/20 2319 98.1 F (36.7 C)     Temp Source 07/12/20 2319 Oral     SpO2 07/12/20 2319 93 %     Weight 07/12/20 2328 245 lb (111.1 kg)     Height 07/12/20 2328 5\' 5"  (1.651 m)     Head Circumference --      Peak Flow --      Pain Score 07/12/20 2328 10     Pain Loc --      Pain Edu? --      Excl. in GC? --     Constitutional: Alert and oriented. Well appearing and in no acute distress. Eyes: Conjunctivae are normal. EOMI. Head: Atraumatic. Nose: No congestion/rhinnorhea. Mouth/Throat: Mucous membranes are moist.   Neck: No stridor. Trachea Midline. FROM Cardiovascular: Normal rate, regular rhythm. Grossly normal heart sounds.  Good peripheral circulation. Respiratory: Normal respiratory effort.  No retractions.  On chronic oxygen Gastrointestinal: Soft and nontender. No distention. No abdominal bruits.  Musculoskeletal: Left knee pain, mostly on the lateral aspect of the knee, mild patella tenderness as well.  Full range of motion, 2+ distal pulse.  Patient is able to ambulate in front of me.  Neurologic:  Normal speech and language. No gross focal neurologic deficits are appreciated.  Skin:  Skin is warm, dry and intact. No rash noted. Psychiatric: Mood and affect are normal. Speech and behavior are normal. GU: Deferred   ____________________________________________   RADIOLOGY 07/14/20, personally viewed and evaluated these images (plain radiographs) as part of my medical decision making, as well as reviewing the written report by the radiologist.  ED MD interpretation: No breaks  Official radiology report(s): DG Knee Complete 4 Views Left  Result Date: 07/13/2020 CLINICAL DATA:  Left knee pain for 5 days. Slipped in the rain with pain since that time. EXAM: LEFT KNEE - COMPLETE 4+ VIEW COMPARISON:  None. FINDINGS: No fracture or dislocation. Mild medial tibiofemoral joint space narrowing and peripheral spurring.  There is a small knee joint effusion. No erosion or bony destruction. IMPRESSION: 1. Mild degenerative change.  No fracture or dislocation. 2. Small joint effusion. Electronically Signed   By: 07/15/2020 M.D.   On: 07/13/2020 00:00    ____________________________________________   PROCEDURES  Procedure(s) performed (including Critical Care):  Procedures   ____________________________________________   INITIAL IMPRESSION / ASSESSMENT AND PLAN / ED COURSE  Eric Lyons was evaluated in Emergency Department on 07/13/2020 for the symptoms described in the history of present illness. He was evaluated in the context of the global COVID-19 pandemic, which necessitated consideration that the patient might be at risk for infection with the SARS-CoV-2 virus that causes COVID-19. Institutional protocols and algorithms that pertain to the evaluation of patients at risk for COVID-19 are in a state of rapid change based on information released by regulatory bodies  including the CDC and federal and state organizations. These policies and algorithms were followed during the patient's care in the ED.    Patient is a well-appearing 54 year old who comes in with left knee pain in the setting of hearing a pop and falling over a week ago.  X-rays ordered evaluate for fracture.  There is no warmth or erythema over the joint to suggest septic joint he does have full range of motion.  However he does have pain with some twisting of the knee.  I have lower suspicion for tibial plateau fracture missed on x-ray is not significantly tender stable to bear weight.  I suspect that there is more likely the potential for meniscal injury, ACL, PCL injury.  2+ distal pulse no swelling of the leg to suggest DVT.  I reviewed the database has not had any oxycodone filled for over 1 year.  We will give a few oxycodone to get him to orthopedic follow-up.  He actually has a primary care doctor appointment on Friday.   Discussed with him that he may need an MRI to further delineate why he is continued to have pain.  Patient understands not to drive on the oxycodone, and the increased risk of falls.  Patient states he has been on it previously and it does not cause any sleepiness or any other issues.  Patient states he has a ride home for today.   I discussed the provisional nature of ED diagnosis, the treatment so far, the ongoing plan of care, follow up appointments and return precautions with the patient and any family or support people present. They expressed understanding and agreed with the plan, discharged home.   ____________________________________________   FINAL CLINICAL IMPRESSION(S) / ED DIAGNOSES   Final diagnoses:  Sprain of left knee, unspecified ligament, initial encounter      MEDICATIONS GIVEN DURING THIS VISIT:  Medications  oxyCODONE (Oxy IR/ROXICODONE) immediate release tablet 5 mg (5 mg Oral Given 07/13/20 0300)     ED Discharge Orders    None       Note:  This document was prepared using Dragon voice recognition software and may include unintentional dictation errors.   Concha Se, MD 07/13/20 [phone]

## 2020-07-14 DIAGNOSIS — Z91018 Allergy to other foods: Secondary | ICD-10-CM | POA: Diagnosis not present

## 2020-07-14 DIAGNOSIS — B351 Tinea unguium: Secondary | ICD-10-CM | POA: Diagnosis not present

## 2020-07-14 DIAGNOSIS — M25562 Pain in left knee: Secondary | ICD-10-CM | POA: Diagnosis not present

## 2020-07-14 DIAGNOSIS — F419 Anxiety disorder, unspecified: Secondary | ICD-10-CM | POA: Diagnosis not present

## 2020-07-19 DIAGNOSIS — J45909 Unspecified asthma, uncomplicated: Secondary | ICD-10-CM | POA: Diagnosis not present

## 2020-07-19 DIAGNOSIS — J449 Chronic obstructive pulmonary disease, unspecified: Secondary | ICD-10-CM | POA: Diagnosis not present

## 2020-07-19 DIAGNOSIS — G4733 Obstructive sleep apnea (adult) (pediatric): Secondary | ICD-10-CM | POA: Diagnosis not present

## 2020-07-28 DIAGNOSIS — S83242A Other tear of medial meniscus, current injury, left knee, initial encounter: Secondary | ICD-10-CM | POA: Diagnosis not present

## 2020-07-28 DIAGNOSIS — M25462 Effusion, left knee: Secondary | ICD-10-CM | POA: Diagnosis not present

## 2020-07-28 DIAGNOSIS — X58XXXA Exposure to other specified factors, initial encounter: Secondary | ICD-10-CM | POA: Diagnosis not present

## 2020-08-02 DIAGNOSIS — M21171 Varus deformity, not elsewhere classified, right ankle: Secondary | ICD-10-CM | POA: Diagnosis not present

## 2020-08-02 DIAGNOSIS — M19071 Primary osteoarthritis, right ankle and foot: Secondary | ICD-10-CM | POA: Diagnosis not present

## 2020-08-02 DIAGNOSIS — M25571 Pain in right ankle and joints of right foot: Secondary | ICD-10-CM | POA: Diagnosis not present

## 2020-08-02 DIAGNOSIS — M19171 Post-traumatic osteoarthritis, right ankle and foot: Secondary | ICD-10-CM | POA: Diagnosis not present

## 2020-08-08 DIAGNOSIS — J449 Chronic obstructive pulmonary disease, unspecified: Secondary | ICD-10-CM | POA: Diagnosis not present

## 2020-08-12 ENCOUNTER — Encounter: Payer: Self-pay | Admitting: Emergency Medicine

## 2020-08-12 ENCOUNTER — Other Ambulatory Visit: Payer: Self-pay

## 2020-08-12 DIAGNOSIS — Z91010 Allergy to peanuts: Secondary | ICD-10-CM | POA: Insufficient documentation

## 2020-08-12 DIAGNOSIS — L03311 Cellulitis of abdominal wall: Secondary | ICD-10-CM | POA: Insufficient documentation

## 2020-08-12 DIAGNOSIS — W57XXXA Bitten or stung by nonvenomous insect and other nonvenomous arthropods, initial encounter: Secondary | ICD-10-CM | POA: Diagnosis not present

## 2020-08-12 DIAGNOSIS — S30861A Insect bite (nonvenomous) of abdominal wall, initial encounter: Secondary | ICD-10-CM | POA: Diagnosis not present

## 2020-08-12 DIAGNOSIS — Y999 Unspecified external cause status: Secondary | ICD-10-CM | POA: Diagnosis not present

## 2020-08-12 DIAGNOSIS — Y929 Unspecified place or not applicable: Secondary | ICD-10-CM | POA: Diagnosis not present

## 2020-08-12 DIAGNOSIS — J449 Chronic obstructive pulmonary disease, unspecified: Secondary | ICD-10-CM | POA: Diagnosis not present

## 2020-08-12 DIAGNOSIS — Y939 Activity, unspecified: Secondary | ICD-10-CM | POA: Insufficient documentation

## 2020-08-12 DIAGNOSIS — J45909 Unspecified asthma, uncomplicated: Secondary | ICD-10-CM | POA: Diagnosis not present

## 2020-08-12 DIAGNOSIS — Z79899 Other long term (current) drug therapy: Secondary | ICD-10-CM | POA: Insufficient documentation

## 2020-08-12 NOTE — ED Triage Notes (Signed)
Patient states that he noticed a bug bite to his abdomen this afternoon.

## 2020-08-13 ENCOUNTER — Emergency Department
Admission: EM | Admit: 2020-08-13 | Discharge: 2020-08-13 | Disposition: A | Discharge status: Home / Self Care | Payer: Medicare HMO | Attending: Emergency Medicine | Admitting: Emergency Medicine

## 2020-08-13 DIAGNOSIS — L03311 Cellulitis of abdominal wall: Secondary | ICD-10-CM

## 2020-08-13 DIAGNOSIS — S30861A Insect bite (nonvenomous) of abdominal wall, initial encounter: Secondary | ICD-10-CM

## 2020-08-13 DIAGNOSIS — W57XXXA Bitten or stung by nonvenomous insect and other nonvenomous arthropods, initial encounter: Secondary | ICD-10-CM

## 2020-08-13 MED ORDER — SULFAMETHOXAZOLE-TRIMETHOPRIM 800-160 MG PO TABS
1.0000 | ORAL_TABLET | Freq: Once | ORAL | Status: AC
  Administered 2020-08-13: 1 via ORAL
  Filled 2020-08-13: qty 1

## 2020-08-13 MED ORDER — SULFAMETHOXAZOLE-TRIMETHOPRIM 800-160 MG PO TABS: 1.0000 | ORAL_TABLET | Freq: Two times a day (BID) | ORAL | 0 refills | Status: AC

## 2020-08-13 NOTE — Discharge Instructions (Signed)

## 2020-08-13 NOTE — ED Provider Notes (Signed)
[facility] Emergency Department Provider Note  ____________________________________________  Time seen: Approximately 6:19 AM  I have reviewed the triage vital signs and the nursing notes.   HISTORY  Chief Complaint Insect Bite   HPI Eric Lyons is a 54 y.o. male with history of COPD, chronic respiratory failure on supplemental oxygen, asthma, obesity who presents for evaluation of an insect bite.  Patient noticed the bite yesterday.  He thinks it was a spider.   He noticed some redness around it.  No fever or chills, no pus, no abscess, no pain.   Past Medical History:  Diagnosis Date  . Asthma   . COPD (chronic obstructive pulmonary disease) [facility])     Patient Active Problem List   Diagnosis Date Noted  . Acute on chronic respiratory failure with hypoxemia (HCC) 02/23/2019  . Anxiety 05/27/2017  . Acute on chronic respiratory failure (HCC) 05/04/2017  . Acute respiratory failure (HCC) 12/31/2016  . COPD, severe (HCC) 12/12/2016  . Asthma exacerbation 12/06/2016  . COPD exacerbation (HCC) 11/13/2015  . Hypoxia 11/13/2015  . S/P ankle fusion 08/05/2012  . Obesity, unspecified 07/09/2012  . Post-traumatic arthritis of ankle, right 05/15/2012  . Chronic ankle pain 03/11/2012  . GERD (gastroesophageal reflux disease) 09/21/2011    Past Surgical History:  Procedure Laterality Date  . ankle fracture surgery    . HERNIA REPAIR    . KNEE ARTHROSCOPY    . TONSILLECTOMY      Prior to Admission medications   Medication Sig Start Date End Date Taking? Authorizing Provider  albuterol (PROVENTIL HFA;VENTOLIN HFA) 108 (90 Base) MCG/ACT inhaler Inhale 2 puffs into the lungs every 6 (six) hours as needed for wheezing or shortness of breath. 12/08/16   Houston Siren, MD  ciclopirox (PENLAC) 8 % solution Apply 1 application topically as needed. 11/02/18   [provider]  COMBIVENT RESPIMAT 20-100 MCG/ACT AERS respimat INHALE 2 PUFFS BY  MOUTH FOUR TIMES DAILY 09/24/18   Merwyn Katos, MD  fluticasone [facility] 50 MCG/ACT nasal spray INSTILL 2 SPRAYS INTO EACH NOSTRIL ONCE DAILY 06/08/18   Merwyn Katos, MD  ipratropium-albuterol (DUONEB) 0.5-2.5 (3) MG/3ML SOLN Take 3 mLs by nebulization every 6 (six) hours. DX: COPD DX Code: J44.9 08/06/18   Merwyn Katos, MD  naproxen (NAPROSYN) 500 MG tablet Take 1 tablet (500 mg total) by mouth 2 (two) times daily with a meal. 11/30/18   Jene Every, MD  predniSONE (DELTASONE) 10 MG tablet Take 1 tablet (10 mg total) by mouth daily. Label  & dispense according to the schedule below.  6 tablets day one, then 5 table day 2, then 4 tablets day 3, then 3 tablets day 4, 2 tablets day 5, then 1 tablet day 6, then stop 02/24/19   Ihor Austin, MD  sulfamethoxazole-trimethoprim (BACTRIM DS) 800-160 MG tablet Take 1 tablet by mouth 2 (two) times daily for 5 days. 08/13/20 08/18/20  Nita Sickle, MD    Allergies Peanut-containing drug products, Fentanyl, and Prednisone  Family History  Problem Relation Age of Onset  . Alcohol abuse Mother   . ALS Father     Social History Social History   Tobacco Use  . Smoking status: Never Smoker  . Smokeless tobacco: Never Used  Substance Use Topics  . Alcohol use: No  . Drug use: No    Review of Systems  Constitutional: Negative for fever. Eyes: Negative for visual changes. ENT: Negative for sore throat. Neck: No neck pain  Cardiovascular: Negative for chest pain. Respiratory: Negative for shortness of breath. Gastrointestinal: Negative for abdominal pain, vomiting or diarrhea. Genitourinary: Negative for dysuria. Musculoskeletal: Negative for back pain. Skin: Negative for rash. + insect bite Neurological: Negative for headaches, weakness or numbness. Psych: No SI or HI  ____________________________________________   PHYSICAL EXAM:  VITAL SIGNS: ED Triage Vitals  Enc Vitals Group     BP 08/12/20 2059 140/72     Pulse  Rate 08/12/20 2059 94     Resp 08/12/20 2059 20     Temp 08/12/20 2059 98.4 F (36.9 C)     Temp Source 08/12/20 2059 Oral     SpO2 08/12/20 2102 97 %     Weight 08/12/20 2101 240 lb (108.9 kg)     Height 08/12/20 2101 5\' 5"  (1.651 m)     Head Circumference --      Peak Flow --      Pain Score 08/12/20 2338 8     Pain Loc --      Pain Edu? --      Excl. in GC? --     Constitutional: Alert and oriented. Well appearing and in no apparent distress. HEENT:      Head: Normocephalic and atraumatic.         Eyes: Conjunctivae are normal. Sclera is non-icteric.       Mouth/Throat: Mucous membranes are moist.       Neck: Supple with no signs of meningismus. Cardiovascular: Regular rate and rhythm. 2339 Respiratory: Normal respiratory effort.  Gastrointestinal: Soft, non tender. There is an insect bite with small amount of surrounding erythema and warmth, no fluctuance, no pus Musculoskeletal: No edema, cyanosis, or erythema of extremities. Neurologic: Normal speech and language. Face is symmetric. Moving all extremities. No gross focal neurologic deficits are appreciated. Skin: Skin is warm, dry and intact. No rash noted. Psychiatric: Mood and affect are normal. Speech and behavior are normal.  ____________________________________________   LABS (all labs ordered are listed, but only abnormal results are displayed)  Labs Reviewed - No data to display ____________________________________________  EKG  none  ____________________________________________  RADIOLOGY  none  ____________________________________________   PROCEDURES  Procedure(s) performed: None Procedures Critical Care performed:  None ____________________________________________   INITIAL IMPRESSION / ASSESSMENT AND PLAN / ED COURSE  54 y.o. male with history of COPD, chronic respiratory failure on supplemental oxygen, asthma, obesity who presents for evaluation of an insect bite.  Patient has what he seems  to be like a spider bite on his abdominal wall with no signs of abscess or pus.  There is small amount of erythema and warmth surrounding the bite.  No systemic symptoms.  No fever.  Will start patient on Bactrim.  Discussed wound care and close observation for any signs of worsening infection or abscess development.  Patient be discharged home with follow-up with PCP.  Old medical records reviewed.      _____________________________________________ Please note:  Patient was evaluated in Emergency Department today for the symptoms described in the history of present illness. Patient was evaluated in the context of the global COVID-19 pandemic, which necessitated consideration that the patient might be at risk for infection with the SARS-CoV-2 virus that causes COVID-19. Institutional protocols and algorithms that pertain to the evaluation of patients at risk for COVID-19 are in a state of rapid change based on information released by regulatory bodies including the CDC and federal and state organizations. These policies and algorithms were followed during the patient's care  in the ED.  Some ED evaluations and interventions may be delayed as a result of limited staffing during the pandemic.   [place] Controlled Substance Database was reviewed by me. ____________________________________________   FINAL CLINICAL IMPRESSION(S) / ED DIAGNOSES   Final diagnoses:  Insect bite of abdominal wall, initial encounter  Cellulitis of abdominal wall      NEW MEDICATIONS STARTED DURING THIS VISIT:  ED Discharge Orders         Ordered    sulfamethoxazole-trimethoprim (BACTRIM DS) 800-160 MG tablet  2 times daily     Discontinue  Reprint     08/13/20 3846           Note:  This document was prepared using Dragon voice recognition software and may include unintentional dictation errors.    Don Perking, [place], MD 08/13/20 [phone]

## 2020-08-15 DIAGNOSIS — F419 Anxiety disorder, unspecified: Secondary | ICD-10-CM | POA: Diagnosis not present

## 2020-09-08 DIAGNOSIS — J449 Chronic obstructive pulmonary disease, unspecified: Secondary | ICD-10-CM | POA: Diagnosis not present

## 2020-09-13 DIAGNOSIS — M19171 Post-traumatic osteoarthritis, right ankle and foot: Secondary | ICD-10-CM | POA: Diagnosis not present

## 2020-09-13 DIAGNOSIS — Z8709 Personal history of other diseases of the respiratory system: Secondary | ICD-10-CM | POA: Diagnosis not present

## 2020-09-13 DIAGNOSIS — Z981 Arthrodesis status: Secondary | ICD-10-CM | POA: Diagnosis not present

## 2020-09-13 DIAGNOSIS — Z01818 Encounter for other preprocedural examination: Secondary | ICD-10-CM | POA: Diagnosis not present

## 2020-09-13 DIAGNOSIS — M19071 Primary osteoarthritis, right ankle and foot: Secondary | ICD-10-CM | POA: Diagnosis not present

## 2020-09-13 DIAGNOSIS — J441 Chronic obstructive pulmonary disease with (acute) exacerbation: Secondary | ICD-10-CM | POA: Diagnosis not present

## 2020-09-13 DIAGNOSIS — F419 Anxiety disorder, unspecified: Secondary | ICD-10-CM | POA: Diagnosis not present

## 2020-09-13 DIAGNOSIS — J449 Chronic obstructive pulmonary disease, unspecified: Secondary | ICD-10-CM | POA: Diagnosis not present

## 2020-09-15 DIAGNOSIS — Z6841 Body Mass Index (BMI) 40.0 and over, adult: Secondary | ICD-10-CM | POA: Diagnosis not present

## 2020-09-15 DIAGNOSIS — Z981 Arthrodesis status: Secondary | ICD-10-CM | POA: Diagnosis not present

## 2020-09-15 DIAGNOSIS — G8929 Other chronic pain: Secondary | ICD-10-CM | POA: Diagnosis not present

## 2020-09-15 DIAGNOSIS — K219 Gastro-esophageal reflux disease without esophagitis: Secondary | ICD-10-CM | POA: Diagnosis not present

## 2020-09-15 DIAGNOSIS — J441 Chronic obstructive pulmonary disease with (acute) exacerbation: Secondary | ICD-10-CM | POA: Diagnosis not present

## 2020-09-15 DIAGNOSIS — Z7951 Long term (current) use of inhaled steroids: Secondary | ICD-10-CM | POA: Diagnosis not present

## 2020-09-15 DIAGNOSIS — J449 Chronic obstructive pulmonary disease, unspecified: Secondary | ICD-10-CM | POA: Diagnosis not present

## 2020-09-15 DIAGNOSIS — G4733 Obstructive sleep apnea (adult) (pediatric): Secondary | ICD-10-CM | POA: Diagnosis not present

## 2020-09-15 DIAGNOSIS — G8918 Other acute postprocedural pain: Secondary | ICD-10-CM | POA: Diagnosis not present

## 2020-09-15 DIAGNOSIS — M19071 Primary osteoarthritis, right ankle and foot: Secondary | ICD-10-CM | POA: Diagnosis not present

## 2020-09-15 DIAGNOSIS — E669 Obesity, unspecified: Secondary | ICD-10-CM | POA: Diagnosis not present

## 2020-09-20 DIAGNOSIS — T887XXA Unspecified adverse effect of drug or medicament, initial encounter: Secondary | ICD-10-CM | POA: Diagnosis not present

## 2020-09-20 DIAGNOSIS — T50904A Poisoning by unspecified drugs, medicaments and biological substances, undetermined, initial encounter: Secondary | ICD-10-CM | POA: Diagnosis not present

## 2020-09-20 DIAGNOSIS — I10 Essential (primary) hypertension: Secondary | ICD-10-CM | POA: Diagnosis not present

## 2020-09-21 ENCOUNTER — Emergency Department: Payer: Medicare HMO

## 2020-09-21 ENCOUNTER — Other Ambulatory Visit: Payer: Self-pay

## 2020-09-21 ENCOUNTER — Encounter: Payer: Self-pay | Admitting: Internal Medicine

## 2020-09-21 ENCOUNTER — Inpatient Hospital Stay
Admission: EM | Admit: 2020-09-21 | Discharge: 2020-09-23 | DRG: 190 | Disposition: A | Discharge status: Home / Self Care | Payer: Medicare HMO | Attending: Internal Medicine | Admitting: Internal Medicine

## 2020-09-21 ENCOUNTER — Observation Stay: Payer: Medicare HMO

## 2020-09-21 DIAGNOSIS — Z283 Underimmunization status: Secondary | ICD-10-CM | POA: Diagnosis not present

## 2020-09-21 DIAGNOSIS — R0789 Other chest pain: Secondary | ICD-10-CM

## 2020-09-21 DIAGNOSIS — F419 Anxiety disorder, unspecified: Secondary | ICD-10-CM | POA: Diagnosis present

## 2020-09-21 DIAGNOSIS — K760 Fatty (change of) liver, not elsewhere classified: Secondary | ICD-10-CM | POA: Diagnosis not present

## 2020-09-21 DIAGNOSIS — G92 Toxic encephalopathy: Secondary | ICD-10-CM | POA: Diagnosis present

## 2020-09-21 DIAGNOSIS — F17290 Nicotine dependence, other tobacco product, uncomplicated: Secondary | ICD-10-CM | POA: Diagnosis present

## 2020-09-21 DIAGNOSIS — Z981 Arthrodesis status: Secondary | ICD-10-CM | POA: Diagnosis not present

## 2020-09-21 DIAGNOSIS — M25571 Pain in right ankle and joints of right foot: Secondary | ICD-10-CM | POA: Diagnosis present

## 2020-09-21 DIAGNOSIS — I10 Essential (primary) hypertension: Secondary | ICD-10-CM | POA: Diagnosis not present

## 2020-09-21 DIAGNOSIS — J929 Pleural plaque without asbestos: Secondary | ICD-10-CM | POA: Diagnosis not present

## 2020-09-21 DIAGNOSIS — J4541 Moderate persistent asthma with (acute) exacerbation: Secondary | ICD-10-CM | POA: Diagnosis not present

## 2020-09-21 DIAGNOSIS — J9611 Chronic respiratory failure with hypoxia: Secondary | ICD-10-CM | POA: Diagnosis not present

## 2020-09-21 DIAGNOSIS — R6510 Systemic inflammatory response syndrome (SIRS) of non-infectious origin without acute organ dysfunction: Secondary | ICD-10-CM | POA: Diagnosis present

## 2020-09-21 DIAGNOSIS — T50904A Poisoning by unspecified drugs, medicaments and biological substances, undetermined, initial encounter: Secondary | ICD-10-CM | POA: Diagnosis not present

## 2020-09-21 DIAGNOSIS — Z888 Allergy status to other drugs, medicaments and biological substances status: Secondary | ICD-10-CM

## 2020-09-21 DIAGNOSIS — R600 Localized edema: Secondary | ICD-10-CM | POA: Diagnosis not present

## 2020-09-21 DIAGNOSIS — J441 Chronic obstructive pulmonary disease with (acute) exacerbation: Principal | ICD-10-CM | POA: Diagnosis present

## 2020-09-21 DIAGNOSIS — L539 Erythematous condition, unspecified: Secondary | ICD-10-CM | POA: Diagnosis not present

## 2020-09-21 DIAGNOSIS — Z20822 Contact with and (suspected) exposure to covid-19: Secondary | ICD-10-CM | POA: Diagnosis present

## 2020-09-21 DIAGNOSIS — Z91010 Allergy to peanuts: Secondary | ICD-10-CM | POA: Diagnosis not present

## 2020-09-21 DIAGNOSIS — J9621 Acute and chronic respiratory failure with hypoxia: Secondary | ICD-10-CM

## 2020-09-21 DIAGNOSIS — D72829 Elevated white blood cell count, unspecified: Secondary | ICD-10-CM | POA: Diagnosis present

## 2020-09-21 DIAGNOSIS — Z79899 Other long term (current) drug therapy: Secondary | ICD-10-CM | POA: Diagnosis not present

## 2020-09-21 DIAGNOSIS — J45901 Unspecified asthma with (acute) exacerbation: Secondary | ICD-10-CM | POA: Diagnosis present

## 2020-09-21 DIAGNOSIS — T50901A Poisoning by unspecified drugs, medicaments and biological substances, accidental (unintentional), initial encounter: Secondary | ICD-10-CM | POA: Diagnosis present

## 2020-09-21 DIAGNOSIS — J449 Chronic obstructive pulmonary disease, unspecified: Secondary | ICD-10-CM | POA: Diagnosis not present

## 2020-09-21 DIAGNOSIS — R109 Unspecified abdominal pain: Secondary | ICD-10-CM | POA: Diagnosis present

## 2020-09-21 DIAGNOSIS — R410 Disorientation, unspecified: Secondary | ICD-10-CM | POA: Diagnosis not present

## 2020-09-21 DIAGNOSIS — F1410 Cocaine abuse, uncomplicated: Secondary | ICD-10-CM | POA: Diagnosis present

## 2020-09-21 DIAGNOSIS — Z9981 Dependence on supplemental oxygen: Secondary | ICD-10-CM | POA: Diagnosis not present

## 2020-09-21 DIAGNOSIS — T405X1A Poisoning by cocaine, accidental (unintentional), initial encounter: Secondary | ICD-10-CM | POA: Diagnosis not present

## 2020-09-21 DIAGNOSIS — R112 Nausea with vomiting, unspecified: Secondary | ICD-10-CM | POA: Diagnosis present

## 2020-09-21 DIAGNOSIS — Z811 Family history of alcohol abuse and dependence: Secondary | ICD-10-CM | POA: Diagnosis not present

## 2020-09-21 DIAGNOSIS — Z9889 Other specified postprocedural states: Secondary | ICD-10-CM | POA: Diagnosis not present

## 2020-09-21 DIAGNOSIS — Z6841 Body Mass Index (BMI) 40.0 and over, adult: Secondary | ICD-10-CM | POA: Diagnosis not present

## 2020-09-21 DIAGNOSIS — J90 Pleural effusion, not elsewhere classified: Secondary | ICD-10-CM | POA: Diagnosis not present

## 2020-09-21 DIAGNOSIS — G929 Unspecified toxic encephalopathy: Secondary | ICD-10-CM | POA: Diagnosis present

## 2020-09-21 DIAGNOSIS — R0602 Shortness of breath: Secondary | ICD-10-CM | POA: Diagnosis not present

## 2020-09-21 LAB — COMPREHENSIVE METABOLIC PANEL
ALT: 27 U/L (ref 0–44)
AST: 24 U/L (ref 15–41)
Albumin: 4.4 g/dL (ref 3.5–5.0)
Alkaline Phosphatase: 76 U/L (ref 38–126)
Anion gap: 13 (ref 5–15)
BUN: 14 mg/dL (ref 6–20)
CO2: 28 mmol/L (ref 22–32)
Calcium: 9.5 mg/dL (ref 8.9–10.3)
Chloride: 93 mmol/L — ABNORMAL LOW (ref 98–111)
Creatinine, Ser: 0.9 mg/dL (ref 0.61–1.24)
GFR calc Af Amer: >60 mL/min (ref >60)
GFR calc non Af Amer: >60 mL/min (ref >60)
Glucose, Bld: 180 mg/dL — ABNORMAL HIGH (ref 70–99)
Potassium: 4.1 mmol/L (ref 3.5–5.1)
Sodium: 134 mmol/L — ABNORMAL LOW (ref 135–145)
Total Bilirubin: 1.6 mg/dL — ABNORMAL HIGH (ref 0.3–1.2)
Total Protein: 8.3 g/dL — ABNORMAL HIGH (ref 6.5–8.1)

## 2020-09-21 LAB — RESPIRATORY PANEL BY RT PCR (FLU A&B, COVID)
Influenza A by PCR: NEGATIVE
Influenza B by PCR: NEGATIVE
SARS Coronavirus 2 by RT PCR: NEGATIVE

## 2020-09-21 LAB — SALICYLATE LEVEL: Salicylate Lvl: <7 mg/dL — ABNORMAL LOW (ref 7.0–30.0)

## 2020-09-21 LAB — BLOOD GAS, VENOUS
Acid-Base Excess: 5.9 mmol/L — ABNORMAL HIGH (ref 0.0–2.0)
Bicarbonate: 30.6 mmol/L — ABNORMAL HIGH (ref 20.0–28.0)
O2 Saturation: 92 %
Patient temperature: 37
pCO2, Ven: 43 mmHg — ABNORMAL LOW (ref 44.0–60.0)
pH, Ven: 7.46 — ABNORMAL HIGH (ref 7.250–7.430)
pO2, Ven: 60 mmHg — ABNORMAL HIGH (ref 32.0–45.0)

## 2020-09-21 LAB — CBC
HCT: 45.7 % (ref 39.0–52.0)
Hemoglobin: 15.1 g/dL (ref 13.0–17.0)
MCH: 29.8 pg (ref 26.0–34.0)
MCHC: 33 g/dL (ref 30.0–36.0)
MCV: 90.3 fL (ref 80.0–100.0)
Platelets: 335 10*3/uL (ref 150–400)
RBC: 5.06 MIL/uL (ref 4.22–5.81)
RDW: 13.4 % (ref 11.5–15.5)
WBC: 16.2 10*3/uL — ABNORMAL HIGH (ref 4.0–10.5)
nRBC: 0 % (ref 0.0–0.2)

## 2020-09-21 LAB — ACETAMINOPHEN LEVEL: Acetaminophen (Tylenol), Serum: <10 ug/mL — ABNORMAL LOW (ref 10–30)

## 2020-09-21 LAB — ETHANOL: Alcohol, Ethyl (B): <10 mg/dL (ref <10)

## 2020-09-21 LAB — LACTIC ACID, PLASMA
Lactic Acid, Venous: 1.1 mmol/L (ref 0.5–1.9)
Lactic Acid, Venous: 1.3 mmol/L (ref 0.5–1.9)

## 2020-09-21 LAB — CK: Total CK: 184 U/L (ref 49–397)

## 2020-09-21 LAB — TROPONIN I (HIGH SENSITIVITY)
Troponin I (High Sensitivity): 6 ng/L (ref <18)
Troponin I (High Sensitivity): 6 ng/L

## 2020-09-21 LAB — LIPASE, BLOOD: Lipase: 22 U/L (ref 11–51)

## 2020-09-21 MED ORDER — IPRATROPIUM-ALBUTEROL 0.5-2.5 (3) MG/3ML IN SOLN
3.0000 mL | Freq: Once | RESPIRATORY_TRACT | Status: AC
  Administered 2020-09-21: 3 mL via RESPIRATORY_TRACT
  Filled 2020-09-21: qty 3

## 2020-09-21 MED ORDER — LORAZEPAM 1 MG PO TABS: 1.0000 mg | ORAL_TABLET | Freq: Once | ORAL | Status: DC

## 2020-09-21 MED ORDER — DOXYCYCLINE HYCLATE 100 MG PO TABS
100.0000 mg | ORAL_TABLET | Freq: Once | ORAL | Status: DC
  Filled 2020-09-21: qty 1

## 2020-09-21 MED ORDER — SODIUM CHLORIDE 0.9 % IV SOLN: INTRAVENOUS | Status: DC

## 2020-09-21 MED ORDER — NICOTINE 21 MG/24HR TD PT24
21.0000 mg | MEDICATED_PATCH | Freq: Every day | TRANSDERMAL | Status: DC
  Administered 2020-09-21: 21 mg via TRANSDERMAL
  Filled 2020-09-21 (×3): qty 1

## 2020-09-21 MED ORDER — LORAZEPAM 2 MG/ML IJ SOLN
2.0000 mg | INTRAMUSCULAR | Status: DC | PRN
  Administered 2020-09-21 – 2020-09-22 (×2): 2 mg via INTRAVENOUS
  Filled 2020-09-21 (×2): qty 1

## 2020-09-21 MED ORDER — LORAZEPAM 2 MG/ML IJ SOLN
2.0000 mg | Freq: Once | INTRAMUSCULAR | Status: AC
  Administered 2020-09-21: 2 mg via INTRAVENOUS
  Filled 2020-09-21: qty 1

## 2020-09-21 MED ORDER — DOXYCYCLINE HYCLATE 100 MG PO CAPS: 100.0000 mg | ORAL_CAPSULE | Freq: Two times a day (BID) | ORAL | 0 refills | Status: DC

## 2020-09-21 MED ORDER — IPRATROPIUM-ALBUTEROL 0.5-2.5 (3) MG/3ML IN SOLN
3.0000 mL | Freq: Once | RESPIRATORY_TRACT | Status: DC
  Filled 2020-09-21: qty 3

## 2020-09-21 MED ORDER — ACETAMINOPHEN 325 MG PO TABS
650.0000 mg | ORAL_TABLET | Freq: Four times a day (QID) | ORAL | Status: DC | PRN
  Administered 2020-09-22 (×3): 650 mg via ORAL
  Filled 2020-09-21 (×3): qty 2

## 2020-09-21 MED ORDER — ONDANSETRON HCL 4 MG/2ML IJ SOLN: 4.0000 mg | Freq: Three times a day (TID) | INTRAMUSCULAR | Status: DC | PRN

## 2020-09-21 MED ORDER — IPRATROPIUM-ALBUTEROL 0.5-2.5 (3) MG/3ML IN SOLN
3.0000 mL | RESPIRATORY_TRACT | Status: DC
  Administered 2020-09-21 – 2020-09-22 (×3): 3 mL via RESPIRATORY_TRACT
  Filled 2020-09-21 (×3): qty 3

## 2020-09-21 MED ORDER — SODIUM CHLORIDE 0.9 % IV BOLUS
500.0000 mL | Freq: Once | INTRAVENOUS | Status: AC
  Administered 2020-09-21: 500 mL via INTRAVENOUS

## 2020-09-21 MED ORDER — METHYLPREDNISOLONE SODIUM SUCC 125 MG IJ SOLR
125.0000 mg | Freq: Once | INTRAMUSCULAR | Status: AC
  Administered 2020-09-21: 125 mg via INTRAVENOUS
  Filled 2020-09-21: qty 2

## 2020-09-21 MED ORDER — SODIUM CHLORIDE 0.9 % IV BOLUS
1000.0000 mL | Freq: Once | INTRAVENOUS | Status: AC
  Administered 2020-09-21: 1000 mL via INTRAVENOUS

## 2020-09-21 MED ORDER — ENOXAPARIN SODIUM 40 MG/0.4ML ~~LOC~~ SOLN
40.0000 mg | Freq: Two times a day (BID) | SUBCUTANEOUS | Status: DC
  Administered 2020-09-21 – 2020-09-23 (×4): 40 mg via SUBCUTANEOUS
  Filled 2020-09-21 (×4): qty 0.4

## 2020-09-21 MED ORDER — ALBUTEROL SULFATE (2.5 MG/3ML) 0.083% IN NEBU
2.5000 mg | INHALATION_SOLUTION | RESPIRATORY_TRACT | Status: DC | PRN
  Administered 2020-09-22: 2.5 mg via RESPIRATORY_TRACT
  Filled 2020-09-21: qty 3

## 2020-09-21 MED ORDER — DM-GUAIFENESIN ER 30-600 MG PO TB12: 1.0000 | ORAL_TABLET | Freq: Two times a day (BID) | ORAL | Status: DC | PRN

## 2020-09-21 MED ORDER — IOHEXOL 300 MG/ML  SOLN
125.0000 mL | Freq: Once | INTRAMUSCULAR | Status: AC | PRN
  Administered 2020-09-21: 125 mL via INTRAVENOUS

## 2020-09-21 MED ORDER — LORAZEPAM 2 MG/ML IJ SOLN: 1.0000 mg | Freq: Once | INTRAMUSCULAR | Status: DC

## 2020-09-21 MED ORDER — SERTRALINE HCL 50 MG PO TABS
50.0000 mg | ORAL_TABLET | Freq: Every day | ORAL | Status: DC
  Administered 2020-09-21 – 2020-09-23 (×3): 50 mg via ORAL
  Filled 2020-09-21 (×3): qty 1

## 2020-09-21 NOTE — ED Notes (Signed)
Patient gone to CT. 

## 2020-09-21 NOTE — ED Notes (Signed)
Patient becoming agitated and feels we are trying to kill him, he says stick me with what you want to stick me with, you just want to kill me anyway. "I am going to die" patient will not keep nasal cannula on. MD notified

## 2020-09-21 NOTE — ED Triage Notes (Addendum)
Pt states he smoked heroin today states more then his normal amount. Here to be checked for possible overdose, pt denies any other drug use. Denies any etoh, denies any SI. Pt co chest pain and shob at this time. Pt restless in triage and repeating same questions.

## 2020-09-21 NOTE — ED Provider Notes (Signed)
[facility] Emergency Department Provider Note  ____________________________________________   First MD Initiated Contact with Patient 09/21/20 [phone]     (approximate)  I have reviewed the triage vital signs and the nursing notes.   HISTORY  Chief Complaint Drug Overdose    HPI Eric Lyons is a 54 y.o. male  Here with multiple complaints. Patient tells me that he smoked crack cocaine last night. He states he smoked "more than usual" and is here for evaluation. After smoking, he felt SOB with some chest tightness that is now resolved. Chest pain was aching, transient, and diffuse. He states he now feels "okay." He has a RLE cast in place and states his leg has been painful but it is improving from recent surgery. Denies any leg swelling, popliteal tenderness, or h/o DVT/PE and he has been up, moving. No fever, chills. Denies any intentional SI, HI, AVH.         Past Medical History:  Diagnosis Date  . Asthma   . COPD (chronic obstructive pulmonary disease) [facility])     Patient Active Problem List   Diagnosis Date Noted  . Drug overdose 09/21/2020  . Toxic encephalopathy 09/21/2020  . SIRS (systemic inflammatory response syndrome) (HCC) 09/21/2020  . Acute on chronic respiratory failure with hypoxemia (HCC) 02/23/2019  . Anxiety 05/27/2017  . Acute on chronic respiratory failure (HCC) 05/04/2017  . Acute respiratory failure (HCC) 12/31/2016  . COPD, severe (HCC) 12/12/2016  . Asthma exacerbation 12/06/2016  . COPD exacerbation (HCC) 11/13/2015  . Hypoxia 11/13/2015  . S/P ankle fusion 08/05/2012  . Obesity, unspecified 07/09/2012  . Post-traumatic arthritis of ankle, right 05/15/2012  . Chronic ankle pain 03/11/2012  . GERD (gastroesophageal reflux disease) 09/21/2011    Past Surgical History:  Procedure Laterality Date  . ankle fracture surgery    . HERNIA REPAIR    . KNEE ARTHROSCOPY    . TONSILLECTOMY      Prior to Admission  medications   Medication Sig Start Date End Date Taking? Authorizing Provider  sertraline (ZOLOFT) 50 MG tablet Take 50 mg by mouth daily. 09/10/20  Yes [provider]  SPIRIVA HANDIHALER 18 MCG inhalation capsule Place 1 capsule into inhaler and inhale daily. 07/07/20  Yes [provider]  terbinafine (LAMISIL) 250 MG tablet Take 250 mg by mouth daily. 07/16/20  Yes [provider]  albuterol (PROVENTIL HFA;VENTOLIN HFA) 108 (90 Base) MCG/ACT inhaler Inhale 2 puffs into the lungs every 6 (six) hours as needed for wheezing or shortness of breath. 12/08/16   Houston Siren, MD  COMBIVENT RESPIMAT 20-100 MCG/ACT AERS respimat INHALE 2 PUFFS BY MOUTH FOUR TIMES DAILY Patient not taking: Reported on 09/21/2020 09/24/18   Merwyn Katos, MD  doxycycline (VIBRAMYCIN) 100 MG capsule Take 1 capsule (100 mg total) by mouth 2 (two) times daily for 7 days. 09/21/20 09/28/20  Shaune Pollack, MD  fluticasone (FLONASE) 50 MCG/ACT nasal spray INSTILL 2 SPRAYS INTO EACH NOSTRIL ONCE DAILY 06/08/18   Merwyn Katos, MD  ipratropium-albuterol (DUONEB) 0.5-2.5 (3) MG/3ML SOLN Take 3 mLs by nebulization every 6 (six) hours. DX: COPD DX Code: J44.9 Patient not taking: Reported on 09/21/2020 08/06/18   Merwyn Katos, MD  naproxen (NAPROSYN) 500 MG tablet Take 1 tablet (500 mg total) by mouth 2 (two) times daily with a meal. Patient not taking: Reported on 09/21/2020 11/30/18   Jene Every, MD  predniSONE (DELTASONE) 10 MG tablet Take 1 tablet (10 mg total) by  mouth daily. Label  & dispense according to the schedule below.  6 tablets day one, then 5 table day 2, then 4 tablets day 3, then 3 tablets day 4, 2 tablets day 5, then 1 tablet day 6, then stop Patient not taking: Reported on 09/21/2020 02/24/19   Ihor Austin, MD    Allergies Peanut-containing drug products, Fentanyl, and Prednisone  Family History  Problem Relation Age of Onset  . Alcohol abuse Mother   . ALS Father      Social History Social History   Tobacco Use  . Smoking status: Never Smoker  . Smokeless tobacco: Never Used  Substance Use Topics  . Alcohol use: No  . Drug use: No    Review of Systems  Review of Systems  Constitutional: Positive for fatigue. Negative for chills and fever.  HENT: Negative for sore throat.   Respiratory: Positive for chest tightness and shortness of breath.   Cardiovascular: Negative for chest pain.  Gastrointestinal: Negative for abdominal pain.  Genitourinary: Negative for flank pain.  Musculoskeletal: Positive for arthralgias. Negative for neck pain.  Skin: Negative for rash and wound.  Allergic/Immunologic: Negative for immunocompromised state.  Neurological: Negative for weakness and numbness.  Hematological: Does not bruise/bleed easily.     ____________________________________________  PHYSICAL EXAM:      VITAL SIGNS: ED Triage Vitals  Enc Vitals Group     BP 09/21/20 0013 (!) 133/95     Pulse Rate 09/21/20 0013 97     Resp 09/21/20 0013 (!) 22     Temp 09/21/20 0013 98.5 F (36.9 C)     Temp Source 09/21/20 0013 Oral     SpO2 09/21/20 0013 96 %     Weight 09/21/20 0015 285 lb (129.3 kg)     Height --      Head Circumference --      Peak Flow --      Pain Score 09/21/20 0014 6     Pain Loc --      Pain Edu? --      Excl. in GC? --      Physical Exam Vitals and nursing note reviewed.  Constitutional:      General: He is not in acute distress.    Appearance: He is well-developed.  HENT:     Head: Normocephalic and atraumatic.  Eyes:     Conjunctiva/sclera: Conjunctivae normal.  Cardiovascular:     Rate and Rhythm: Normal rate and regular rhythm.     Heart sounds: Normal heart sounds. No murmur heard.  No friction rub.  Pulmonary:     Effort: Pulmonary effort is normal. No respiratory distress.     Breath sounds: Wheezing present. No rales.  Abdominal:     General: There is no distension.     Palpations: Abdomen is soft.      Tenderness: There is no abdominal tenderness.  Musculoskeletal:     Cervical back: Neck supple.     Comments: RLE cast in place. Mild skin breakdown along superior aspect of cast with surrounding redness. No drainage or foul odor noted.  Skin:    General: Skin is warm.     Capillary Refill: Capillary refill takes less than 2 seconds.  Neurological:     Mental Status: He is alert and oriented to person, place, and time.     Motor: No abnormal muscle tone.       ____________________________________________   LABS (all labs ordered are listed, but only abnormal results are displayed)  Labs Reviewed  CBC - Abnormal; Notable for the following components:      Result Value   WBC 16.2 (*)    All other components within normal limits  COMPREHENSIVE METABOLIC PANEL - Abnormal; Notable for the following components:   Sodium 134 (*)    Chloride 93 (*)    Glucose, Bld 180 (*)    Total Protein 8.3 (*)    Total Bilirubin 1.6 (*)    All other components within normal limits  BLOOD GAS, VENOUS - Abnormal; Notable for the following components:   pH, Ven 7.46 (*)    pCO2, Ven 43 (*)    pO2, Ven 60.0 (*)    Bicarbonate 30.6 (*)    Acid-Base Excess 5.9 (*)    All other components within normal limits  CULTURE, BLOOD (ROUTINE X 2)  CULTURE, BLOOD (ROUTINE X 2)  RESPIRATORY PANEL BY RT PCR (FLU A&B, COVID)  ETHANOL  CK  LACTIC ACID, PLASMA  URINE DRUG SCREEN, QUALITATIVE ([facility] ONLY)  LACTIC ACID, PLASMA  ACETAMINOPHEN LEVEL  SALICYLATE LEVEL  TROPONIN I (HIGH SENSITIVITY)  TROPONIN I (HIGH SENSITIVITY)    ____________________________________________  EKG: Normal sinus rhythm, VR 92. PR 156, QRS 82, QTc 437. No acute ST elevations or depressions. No ischemia or infarct. ________________________________________  RADIOLOGY All imaging, including plain films, CT scans, and ultrasounds, independently reviewed by me, and interpretations confirmed via formal radiology reads.  ED  MD interpretation:   CXR: Reviewed by me, negative CT head: No acute abnormality X-ray ankle right: Postsurgical changes, no acute abnormality  Official radiology report(s): DG Ankle Complete Right  Result Date: 09/21/2020 CLINICAL DATA:  54 year old male with red and painful right ankle status post recent surgery. EXAM: RIGHT ANKLE - COMPLETE 3+ VIEW COMPARISON:  11/30/2018 FINDINGS: Overlying casting material limits fine osseous and soft tissue detail. Postsurgical changes after calcaneotalar and calcanealotibial fusion with multiple calcaneal fixation screws in place. Unchanged appearance of previously visualized anterior tibiotalar plate and screw apparatus. Chronic fracture deformity of the distal tibia. There is generalized right lower extremity edema, similar to comparison. No radiopaque foreign bodies. IMPRESSION: Postsurgical changes after calcaneo-talar and calcaneo-tibial fixation without complicating features. Overlying casting material limits fine osseous and soft tissue detail. Electronically Signed   By: Marliss Coots MD   On: 09/21/2020 13:45   CT Head Wo Contrast  Result Date: 09/21/2020 CLINICAL DATA:  Delirium.  Heroin use. EXAM: CT HEAD WITHOUT CONTRAST TECHNIQUE: Contiguous axial images were obtained from the base of the skull through the vertex without intravenous contrast. COMPARISON:  None. FINDINGS: Brain: No evidence of acute large vascular territory infarction, hemorrhage, hydrocephalus, extra-axial collection or mass lesion/mass effect. Evaluation of the posterior fossa is limited by streak artifact. Vascular: No hyperdense vessel or unexpected calcification. Skull: Normal. Negative for fracture or focal lesion. Sinuses/Orbits: No acute finding. Other: No mastoid effusion. IMPRESSION: No evidence of acute intracranial abnormality. Electronically Signed   By: Feliberto Harts MD   On: 09/21/2020 13:48   DG Chest Portable 1 View  Result Date: 09/21/2020 CLINICAL DATA:   Shortness of breath EXAM: PORTABLE CHEST 1 VIEW COMPARISON:  02/22/2019 FINDINGS: The heart size and mediastinal contours are stable. Chronic mildly prominent interstitial markings. No focal airspace consolidation, pleural effusion, or pneumothorax. The visualized skeletal structures are unremarkable. IMPRESSION: No active disease.  Chronic bronchitic type lung changes. Electronically Signed   By: Duanne Guess D.O.   On: 09/21/2020 10:05    ____________________________________________  PROCEDURES   Procedure(s) performed (  including Critical Care):  Procedures  ____________________________________________  INITIAL IMPRESSION / MDM / ASSESSMENT AND PLAN / ED COURSE  As part of my medical decision making, I reviewed the following data within the electronic MEDICAL RECORD NUMBER Nursing notes reviewed and incorporated, Old chart reviewed, Notes from prior ED visits, and [place] Controlled Substance Database       *PATRICE MOATES was evaluated in Emergency Department on 09/21/2020 for the symptoms described in the history of present illness. He was evaluated in the context of the global COVID-19 pandemic, which necessitated consideration that the patient might be at risk for infection with the SARS-CoV-2 virus that causes COVID-19. Institutional protocols and algorithms that pertain to the evaluation of patients at risk for COVID-19 are in a state of rapid change based on information released by regulatory bodies including the CDC and federal and state organizations. These policies and algorithms were followed during the patient's care in the ED.  Some ED evaluations and interventions may be delayed as a result of limited staffing during the pandemic.*  Clinical Course as of Sep 21 1414  Thu Sep 21, 2020  0954 54 yo M here with accidental drug overdose. Unclear whether he used cocaine or heroin per history, but states it was cocaine on my interview. Regardless, he is now >8 hr out and is stable  clinically. He has a mild, likely reactive leukocytosis. CMP is at baseline with exception of mild TBili elevation likely related to dehydration. No abd pain, no RUQ TTP. Trop neg x 2 and EKG is nonischemic. He is s/p recent surgery on 9/17 but has no leg swelling, no popliteal TTP or signs of DVT/PE. He is not tachycardic or hypoxic.   [CI]  1005 Repeat HR, O2 sats wnl for patient. Mild tachypnea noted. Will give duoneb.   [CI]  1350 While awaiting transport, patient became increasingly confused, altered. Unclear whether this is 2/2 polysubstance abuse vs his COPD. No retention noted on VBG. EtOH negative, denies regular EtOH use though w/d is also a consideration given the timing, Pachycardia, hypertension. CXR w/o focal PNA. CT head negative. XR ankle obtained 2/2 his concern that something was 'stuck' in cast - no apparent FB noted.    [CI]  1352 Will admit for persistent increased WOB, tachypnea, likely COPD exacerbation and AMS, possibly from polysubstance use.   [CI]    Clinical Course User Index [CI] Shaune Pollack, MD     Medical Decision Making:  As above. While awaiting transport, patient became increasingly altered. See above. Admit for likely toxic encephalopathy and COPD. As mentioned, mild erythema of R leg above ankle noted but no leukocytosis, fever, lactic acidosis, or signs of infection.  ____________________________________________  FINAL CLINICAL IMPRESSION(S) / ED DIAGNOSES  Final diagnoses:  Accidental overdose, initial encounter  Erythema  Chronic obstructive pulmonary disease, unspecified COPD type (HCC)     MEDICATIONS GIVEN DURING THIS VISIT:  Medications  methylPREDNISolone sodium succinate (SOLU-MEDROL) 125 mg/2 mL injection 125 mg (has no administration in time range)  ipratropium-albuterol (DUONEB) 0.5-2.5 (3) MG/3ML nebulizer solution 3 mL (has no administration in time range)  ipratropium-albuterol (DUONEB) 0.5-2.5 (3) MG/3ML nebulizer solution 3 mL  (has no administration in time range)  ipratropium-albuterol (DUONEB) 0.5-2.5 (3) MG/3ML nebulizer solution 3 mL (has no administration in time range)  albuterol (PROVENTIL) (2.5 MG/3ML) 0.083% nebulizer solution 2.5 mg (has no administration in time range)  dextromethorphan-guaiFENesin (MUCINEX DM) 30-600 MG per 12 hr tablet 1 tablet (has no administration in time range)  nicotine (NICODERM CQ - dosed in mg/24 hours) patch 21 mg (has no administration in time range)  ondansetron (ZOFRAN) injection 4 mg (has no administration in time range)  acetaminophen (TYLENOL) tablet 650 mg (has no administration in time range)  0.9 %  sodium chloride infusion (has no administration in time range)  ipratropium-albuterol (DUONEB) 0.5-2.5 (3) MG/3ML nebulizer solution 3 mL (3 mLs Nebulization Given 09/21/20 1009)  LORazepam (ATIVAN) injection 2 mg (2 mg Intravenous Given 09/21/20 1231)  sodium chloride 0.9 % bolus 1,000 mL (1,000 mLs Intravenous New Bag/Given 09/21/20 1233)  ipratropium-albuterol (DUONEB) 0.5-2.5 (3) MG/3ML nebulizer solution 3 mL (3 mLs Nebulization Given 09/21/20 1232)     ED Discharge Orders         Ordered    doxycycline (VIBRAMYCIN) 100 MG capsule  2 times daily        09/21/20 1009           Note:  This document was prepared using Dragon voice recognition software and may include unintentional dictation errors.   Shaune PollackIsaacs, Innocence Schlotzhauer, MD 09/21/20 1415

## 2020-09-21 NOTE — H&P (Signed)
History and Physical    JAYSHUN GALENTINE ZOX:[id number] DOB: 06/22/1966 DOA: 09/21/2020  Referring MD/NP/PA:   PCP: [facility]   Patient coming from:  The patient is coming from home.  At baseline, pt is independent for most of ADL.        Chief Complaint: drug overdose.   HPI: ABDULWAHAB DEMELO is a 54 y.o. male with medical history significant of COPD on 3 L oxygen at home, asthma, anxiety, drug abuse, who presents with drug overdose.   Pt states that he smoked crack cocaine last night. He states he smoked "more than usual". He has shortness of breath and mild cough. He had chest tightness which has resolved currently. No fever or chills. Pt states that he is on 3L oxygen at home.  He came in without oxygen. Patient was found to have oxygen desaturation to 87-88% on 2 L home level oxygen, which improved to 97% on 3 L oxygen. Patient states that he has nausea, vomiting and some abdominal pain.  He had 1 loose stool bowel movement earlier.  Denies symptoms of UTI. Pt has confusion. Per RN'note, "patient has a bizarre smile on face.  Patient is orientated to place and person, but not to the time. Denies suicidal or homicidal ideations. Patient has RLE cast in place and states his ankle has been painful, but it is improving from recent ankle fusion surgery.     D Course: pt was found to have WBC 16.2, lactic acid 1.1, troponin level 6 --> 6, Tylenol level less than 10, salicylate level less than 7, CK 184, alcohol level less than 10, negative Covid PCR, electrolytes renal function okay, temperature normal, soft blood pressure, heart rate 93, 83, RR 23, chest x-ray negative.  CT head negative for acute intracranial abnormalities. Pt is placed on progressive bed for observation.   Review of Systems: Could not be reviewed accurately due to altered mental status  Allergy:  Allergies  Allergen Reactions  . Peanut-Containing Drug Products   . Fentanyl Rash  . Prednisone Other (See  Comments)    Irritable    Past Medical History:  Diagnosis Date  . Asthma   . COPD (chronic obstructive pulmonary disease) (HCC)     Past Surgical History:  Procedure Laterality Date  . ankle fracture surgery    . HERNIA REPAIR    . KNEE ARTHROSCOPY    . TONSILLECTOMY      Social History:  reports that he has never smoked. He has never used smokeless tobacco. He reports current drug use. He reports that he does not drink alcohol.  Family History:  Family History  Problem Relation Age of Onset  . Alcohol abuse Mother   . ALS Father      Prior to Admission medications   Medication Sig Start Date End Date Taking? Authorizing Provider  sertraline (ZOLOFT) 50 MG tablet Take 50 mg by mouth daily. 09/10/20  Yes [provider]  SPIRIVA HANDIHALER 18 MCG inhalation capsule Place 1 capsule into inhaler and inhale daily. 07/07/20  Yes [provider]  terbinafine (LAMISIL) 250 MG tablet Take 250 mg by mouth daily. 07/16/20  Yes [provider]  albuterol (PROVENTIL HFA;VENTOLIN HFA) 108 (90 Base) MCG/ACT inhaler Inhale 2 puffs into the lungs every 6 (six) hours as needed for wheezing or shortness of breath. 12/08/16   Sainani, Rolly Pancake, MD  COMBIVENT RESPIMAT 20-100 MCG/ACT AERS respimat INHALE 2 PUFFS BY MOUTH FOUR TIMES DAILY Patient not taking:  Reported on 09/21/2020 09/24/18   Merwyn Katos, MD  doxycycline (VIBRAMYCIN) 100 MG capsule Take 1 capsule (100 mg total) by mouth 2 (two) times daily for 7 days. 09/21/20 09/28/20  Shaune Pollack, MD  fluticasone (FLONASE) 50 MCG/ACT nasal spray INSTILL 2 SPRAYS INTO EACH NOSTRIL ONCE DAILY 06/08/18   Merwyn Katos, MD  ipratropium-albuterol (DUONEB) 0.5-2.5 (3) MG/3ML SOLN Take 3 mLs by nebulization every 6 (six) hours. DX: COPD DX Code: J44.9 Patient not taking: Reported on 09/21/2020 08/06/18   Merwyn Katos, MD  naproxen (NAPROSYN) 500 MG tablet Take 1 tablet (500 mg total) by mouth 2 (two) times daily with a  meal. Patient not taking: Reported on 09/21/2020 11/30/18   Jene Every, MD  predniSONE (DELTASONE) 10 MG tablet Take 1 tablet (10 mg total) by mouth daily. Label  & dispense according to the schedule below.  6 tablets day one, then 5 table day 2, then 4 tablets day 3, then 3 tablets day 4, 2 tablets day 5, then 1 tablet day 6, then stop Patient not taking: Reported on 09/21/2020 02/24/19   Ihor Austin, MD    Physical Exam: Vitals:   09/21/20 1000 09/21/20 1030 09/21/20 1230 09/21/20 1300  BP: (!) 182/96 (!) 189/112 (!) 152/88 95/83  Pulse: 88 88 93 83  Resp: (!) 22 20 16  (!) 23  Temp:      TempSrc:      SpO2: 93% 90% 92% 97%  Weight:       General: Not in acute distress HEENT:       Eyes: PERRL, EOMI, no scleral icterus.       ENT: No discharge from the ears and nose, no pharynx injection, no tonsillar enlargement.        Neck: No JVD, no bruit, no mass felt. Heme: No neck lymph node enlargement. Cardiac: S1/S2, RRR, No murmurs, No gallops or rubs. Respiratory: has mild wheezing bilaterally GI: diistended, with diffused tenderness, no organomegaly, BS present. GU: No hematuria Ext: No pitting leg edema bilaterally. Has right ankle cast in place  Musculoskeletal: No joint deformities, No joint redness or warmth, no limitation of ROM in spin. Skin: No rashes.  Neuro: confused, oriented to place and person, not to time, cranial nerves II-XII grossly intact, moves all extremities normally.  Psych: Patient is not psychotic, no suicidal or hemocidal ideation.  Labs on Admission: I have personally reviewed following labs and imaging studies  CBC: Recent Labs  Lab 09/21/20 0019  WBC 16.2*  HGB 15.1  HCT 45.7  MCV 90.3  PLT 335   Basic Metabolic Panel: Recent Labs  Lab 09/21/20 0019  NA 134*  K 4.1  CL 93*  CO2 28  GLUCOSE 180*  BUN 14  CREATININE 0.90  CALCIUM 9.5   GFR: Estimated Creatinine Clearance: 116.2 mL/min (by C-G formula based on SCr of 0.9  mg/dL). Liver Function Tests: Recent Labs  Lab 09/21/20 0019  AST 24  ALT 27  ALKPHOS 76  BILITOT 1.6*  PROT 8.3*  ALBUMIN 4.4   Recent Labs  Lab 09/21/20 0219  LIPASE 22   No results for input(s): AMMONIA in the last 168 hours. Coagulation Profile: No results for input(s): INR, PROTIME in the last 168 hours. Cardiac Enzymes: Recent Labs  Lab 09/21/20 1213  CKTOTAL 184   BNP (last 3 results) No results for input(s): PROBNP in the last 8760 hours. HbA1C: No results for input(s): HGBA1C in the last 72 hours. CBG: No results for  input(s): GLUCAP in the last 168 hours. Lipid Profile: No results for input(s): CHOL, HDL, LDLCALC, TRIG, CHOLHDL, LDLDIRECT in the last 72 hours. Thyroid Function Tests: No results for input(s): TSH, T4TOTAL, FREET4, T3FREE, THYROIDAB in the last 72 hours. Anemia Panel: No results for input(s): VITAMINB12, FOLATE, FERRITIN, TIBC, IRON, RETICCTPCT in the last 72 hours. Urine analysis: No results found for: COLORURINE, APPEARANCEUR, LABSPEC, PHURINE, GLUCOSEU, HGBUR, BILIRUBINUR, KETONESUR, PROTEINUR, UROBILINOGEN, NITRITE, LEUKOCYTESUR Sepsis Labs: @LABRCNTIP (procalcitonin:4,lacticidven:4) ) Recent Results (from the past 240 hour(s))  Respiratory Panel by RT PCR (Flu A&B, Covid) - Nasopharyngeal Swab     Status: None   Collection Time: 09/21/20  4:14 PM   Specimen: Nasopharyngeal Swab  Result Value Ref Range Status   SARS Coronavirus 2 by RT PCR NEGATIVE NEGATIVE Final    Comment: (NOTE) SARS-CoV-2 target nucleic acids are NOT DETECTED.  The SARS-CoV-2 RNA is generally detectable in upper respiratoy specimens during the acute phase of infection. The lowest concentration of SARS-CoV-2 viral copies this assay can detect is 131 copies/mL. A negative result does not preclude SARS-Cov-2 infection and should not be used as the sole basis for treatment or other patient management decisions. A negative result may occur with  improper specimen  collection/handling, submission of specimen other than nasopharyngeal swab, presence of viral mutation(s) within the areas targeted by this assay, and inadequate number of viral copies (<131 copies/mL). A negative result must be combined with clinical observations, patient history, and epidemiological information. The expected result is Negative.  Fact Sheet for Patients:  09/23/20  Fact Sheet for Healthcare Providers:  https://www.moore.com/  This test is no t yet approved or cleared by the https://www.young.biz/ FDA and  has been authorized for detection and/or diagnosis of SARS-CoV-2 by FDA under an Emergency Use Authorization (EUA). This EUA will remain  in effect (meaning this test can be used) for the duration of the COVID-19 declaration under Section 564(b)(1) of the Act, 21 U.S.C. section 360bbb-3(b)(1), unless the authorization is terminated or revoked sooner.     Influenza A by PCR NEGATIVE NEGATIVE Final   Influenza B by PCR NEGATIVE NEGATIVE Final    Comment: (NOTE) The Xpert Xpress SARS-CoV-2/FLU/RSV assay is intended as an aid in  the diagnosis of influenza from Nasopharyngeal swab specimens and  should not be used as a sole basis for treatment. Nasal washings and  aspirates are unacceptable for Xpert Xpress SARS-CoV-2/FLU/RSV  testing.  Fact Sheet for Patients: [place]  Fact Sheet for Healthcare Providers: https://www.moore.com/  This test is not yet approved or cleared by the https://www.young.biz/ FDA and  has been authorized for detection and/or diagnosis of SARS-CoV-2 by  FDA under an Emergency Use Authorization (EUA). This EUA will remain  in effect (meaning this test can be used) for the duration of the  Covid-19 declaration under Section 564(b)(1) of the Act, 21  U.S.C. section 360bbb-3(b)(1), unless the authorization is  terminated or revoked. Performed at [facility], [address] [place], [place]      Radiological Exams on Admission: DG Ankle Complete Right  Result Date: 09/21/2020 CLINICAL DATA:  54 year old male with red and painful right ankle status post recent surgery. EXAM: RIGHT ANKLE - COMPLETE 3+ VIEW COMPARISON:  11/30/2018 FINDINGS: Overlying casting material limits fine osseous and soft tissue detail. Postsurgical changes after calcaneotalar and calcanealotibial fusion with multiple calcaneal fixation screws in place. Unchanged appearance of previously visualized anterior tibiotalar plate and screw apparatus. Chronic fracture deformity of the distal tibia.  There is generalized right lower extremity edema, similar to comparison. No radiopaque foreign bodies. IMPRESSION: Postsurgical changes after calcaneo-talar and calcaneo-tibial fixation without complicating features. Overlying casting material limits fine osseous and soft tissue detail. Electronically Signed   By: Marliss Coots MD   On: 09/21/2020 13:45   CT Head Wo Contrast  Result Date: 09/21/2020 CLINICAL DATA:  Delirium.  Heroin use. EXAM: CT HEAD WITHOUT CONTRAST TECHNIQUE: Contiguous axial images were obtained from the base of the skull through the vertex without intravenous contrast. COMPARISON:  None. FINDINGS: Brain: No evidence of acute large vascular territory infarction, hemorrhage, hydrocephalus, extra-axial collection or mass lesion/mass effect. Evaluation of the posterior fossa is limited by streak artifact. Vascular: No hyperdense vessel or unexpected calcification. Skull: Normal. Negative for fracture or focal lesion. Sinuses/Orbits: No acute finding. Other: No mastoid effusion. IMPRESSION: No evidence of acute intracranial abnormality. Electronically Signed   By: Feliberto Harts MD   On: 09/21/2020 13:48   CT ABDOMEN PELVIS W CONTRAST  Result Date: 09/21/2020 CLINICAL DATA:  Abdominal pain and fever.  Leukocytosis. EXAM: CT ABDOMEN AND PELVIS WITH  CONTRAST TECHNIQUE: Multidetector CT imaging of the abdomen and pelvis was performed using the standard protocol following bolus administration of intravenous contrast. CONTRAST:  OMNIPAQUE IOHEXOL 300 MG/ML  SOLN COMPARISON:  CT dated December 31, 2016. FINDINGS: Lower chest: There is a 6 mm pulmonary nodule in the left lower lobe (axial series 4, image 7). This is stable since 2014 and is consistent with a benign process.The heart size is normal. Hepatobiliary: There is decreased hepatic attenuation suggestive of hepatic steatosis. Normal gallbladder.There is no biliary ductal dilation. Pancreas: Normal contours without ductal dilatation. No peripancreatic fluid collection. Spleen: The spleen is enlarged measuring 12.7 cm craniocaudad Adrenals/Urinary Tract: --Adrenal glands: Unremarkable. --Right kidney/ureter: No hydronephrosis or radiopaque kidney stones. --Left kidney/ureter: No hydronephrosis or radiopaque kidney stones. --Urinary bladder: Unremarkable. Stomach/Bowel: --Stomach/Duodenum: No hiatal hernia or other gastric abnormality. Normal duodenal course and caliber. --Small bowel: Unremarkable. --Colon: Rectosigmoid diverticulosis without acute inflammation. --Appendix: Normal. Vascular/Lymphatic: Normal course and caliber of the major abdominal vessels. --No retroperitoneal lymphadenopathy. --No mesenteric lymphadenopathy. --No pelvic or inguinal lymphadenopathy. Reproductive: Unremarkable Other: No ascites or free air. The abdominal wall is normal. Musculoskeletal. No acute displaced fractures. IMPRESSION: 1. No acute abdominopelvic abnormality. 2. Hepatic steatosis. 3. Splenomegaly. 4. Rectosigmoid diverticulosis without acute inflammation. Electronically Signed   By: Katherine Mantle M.D.   On: 09/21/2020 17:02   DG Chest Portable 1 View  Result Date: 09/21/2020 CLINICAL DATA:  Shortness of breath EXAM: PORTABLE CHEST 1 VIEW COMPARISON:  02/22/2019 FINDINGS: The heart size and mediastinal  contours are stable. Chronic mildly prominent interstitial markings. No focal airspace consolidation, pleural effusion, or pneumothorax. The visualized skeletal structures are unremarkable. IMPRESSION: No active disease.  Chronic bronchitic type lung changes. Electronically Signed   By: Duanne Guess D.O.   On: 09/21/2020 10:05     EKG: Independently reviewed.  Sinus rhythm, QTC 437, poor R wave progression, no ischemic change.   Assessment/Plan Principal Problem:   Drug overdose Active Problems:   COPD exacerbation (HCC)   Asthma exacerbation   S/P ankle fusion   Toxic encephalopathy   SIRS (systemic inflammatory response syndrome) (HCC)   Abdominal pain   Drug overdose and Toxic encephalopathy: CT head is negative for acute intracranial abnormality.-Placed on progressive bed for observation -Placed on progressive bed for observation -Check UDS -Frequent neuro check -Check ammonia level -As needed Ativan for agitation  COPD exacerbation  and Asthma exacerbation: Patient is wheezing on auscultation.  Chest x-ray negative. -Bronchodilators, as needed Mucinex -Patient received 1 dose of Solu-Medrol 125 mg  S/P ankle fusion-right ankle -has cast in place -As needed Tylenol for pain  SIRS (systemic inflammatory response syndrome) [facility](HCC): Patient meets criteria for SIRS with leukocytosis with WBC 16.2, heart rate 93, RR 23.  Lactic acid is 1.1.  No source of infection identified. -Follow-up blood culture and a urinalysis -IV fluid: 1 L normal saline, followed by 100 cc/h  Abdominal pain: Etiology is not clear. -Check lipase --> 22 -Follow-up CT abdomen/pelvis --> results are negative for acute findings      DVT ppx: SQ Lovenox Code Status: Full code Family Communication: not done, no family member is at bed side.     Disposition Plan:  Anticipate discharge back to previous environment Consults called:  none Admission status:   progressive unit for obs   Status is:  Observation  The patient remains OBS appropriate and will d/c before 2 midnights.  Dispo: The patient is from: Home              Anticipated d/c is to: Home              Anticipated d/c date is: 1 day              Patient currently is not medically stable to d/c.          Date of Service 09/21/2020    Lorretta HarpXilin Maurene Hollin [facility]   If 7PM-7AM, please contact night-coverage [email or web address] 09/21/2020, 6:05 PM

## 2020-09-21 NOTE — ED Notes (Signed)
Patient refused both nebulizer and doxycycline, he said "im not confused or anything, I would just rather not take those medications". Patient has a bizarre smile on face and says "yes ma'am"

## 2020-09-21 NOTE — ED Notes (Signed)
Pt placed on external catheter at this time by this RN. Pt chux changed and patient clean/dry at this time. Pt given warm blankets at this time, pt comfortable at this time, no further needs noted.

## 2020-09-22 ENCOUNTER — Inpatient Hospital Stay: Payer: Medicare HMO

## 2020-09-22 ENCOUNTER — Encounter: Payer: Self-pay | Admitting: Internal Medicine

## 2020-09-22 DIAGNOSIS — J9611 Chronic respiratory failure with hypoxia: Secondary | ICD-10-CM | POA: Diagnosis present

## 2020-09-22 DIAGNOSIS — G92 Toxic encephalopathy: Secondary | ICD-10-CM | POA: Diagnosis present

## 2020-09-22 DIAGNOSIS — J929 Pleural plaque without asbestos: Secondary | ICD-10-CM | POA: Diagnosis not present

## 2020-09-22 DIAGNOSIS — J45901 Unspecified asthma with (acute) exacerbation: Secondary | ICD-10-CM | POA: Diagnosis present

## 2020-09-22 DIAGNOSIS — Z283 Underimmunization status: Secondary | ICD-10-CM | POA: Diagnosis not present

## 2020-09-22 DIAGNOSIS — I10 Essential (primary) hypertension: Secondary | ICD-10-CM | POA: Diagnosis not present

## 2020-09-22 DIAGNOSIS — R109 Unspecified abdominal pain: Secondary | ICD-10-CM | POA: Diagnosis present

## 2020-09-22 DIAGNOSIS — R6510 Systemic inflammatory response syndrome (SIRS) of non-infectious origin without acute organ dysfunction: Secondary | ICD-10-CM | POA: Diagnosis not present

## 2020-09-22 DIAGNOSIS — J449 Chronic obstructive pulmonary disease, unspecified: Secondary | ICD-10-CM | POA: Diagnosis not present

## 2020-09-22 DIAGNOSIS — F17290 Nicotine dependence, other tobacco product, uncomplicated: Secondary | ICD-10-CM | POA: Diagnosis present

## 2020-09-22 DIAGNOSIS — Z811 Family history of alcohol abuse and dependence: Secondary | ICD-10-CM | POA: Diagnosis not present

## 2020-09-22 DIAGNOSIS — J441 Chronic obstructive pulmonary disease with (acute) exacerbation: Secondary | ICD-10-CM | POA: Diagnosis present

## 2020-09-22 DIAGNOSIS — T405X1A Poisoning by cocaine, accidental (unintentional), initial encounter: Secondary | ICD-10-CM | POA: Diagnosis present

## 2020-09-22 DIAGNOSIS — R112 Nausea with vomiting, unspecified: Secondary | ICD-10-CM | POA: Diagnosis present

## 2020-09-22 DIAGNOSIS — Z79899 Other long term (current) drug therapy: Secondary | ICD-10-CM | POA: Diagnosis not present

## 2020-09-22 DIAGNOSIS — Z6841 Body Mass Index (BMI) 40.0 and over, adult: Secondary | ICD-10-CM | POA: Diagnosis not present

## 2020-09-22 DIAGNOSIS — T50901A Poisoning by unspecified drugs, medicaments and biological substances, accidental (unintentional), initial encounter: Secondary | ICD-10-CM | POA: Diagnosis present

## 2020-09-22 DIAGNOSIS — F419 Anxiety disorder, unspecified: Secondary | ICD-10-CM | POA: Diagnosis present

## 2020-09-22 DIAGNOSIS — J4541 Moderate persistent asthma with (acute) exacerbation: Secondary | ICD-10-CM | POA: Diagnosis not present

## 2020-09-22 DIAGNOSIS — F1410 Cocaine abuse, uncomplicated: Secondary | ICD-10-CM | POA: Diagnosis present

## 2020-09-22 DIAGNOSIS — Z91010 Allergy to peanuts: Secondary | ICD-10-CM | POA: Diagnosis not present

## 2020-09-22 DIAGNOSIS — Z20822 Contact with and (suspected) exposure to covid-19: Secondary | ICD-10-CM | POA: Diagnosis present

## 2020-09-22 DIAGNOSIS — Z981 Arthrodesis status: Secondary | ICD-10-CM | POA: Diagnosis not present

## 2020-09-22 DIAGNOSIS — Z9981 Dependence on supplemental oxygen: Secondary | ICD-10-CM | POA: Diagnosis not present

## 2020-09-22 DIAGNOSIS — D72829 Elevated white blood cell count, unspecified: Secondary | ICD-10-CM | POA: Diagnosis present

## 2020-09-22 DIAGNOSIS — M25571 Pain in right ankle and joints of right foot: Secondary | ICD-10-CM | POA: Diagnosis present

## 2020-09-22 DIAGNOSIS — Z888 Allergy status to other drugs, medicaments and biological substances status: Secondary | ICD-10-CM | POA: Diagnosis not present

## 2020-09-22 LAB — URINE DRUG SCREEN, QUALITATIVE (ARMC ONLY)
Amphetamines, Ur Screen: NOT DETECTED
Barbiturates, Ur Screen: NOT DETECTED
Benzodiazepine, Ur Scrn: POSITIVE — AB
Cannabinoid 50 Ng, Ur ~~LOC~~: POSITIVE — AB
Cocaine Metabolite,Ur ~~LOC~~: POSITIVE — AB
MDMA (Ecstasy)Ur Screen: NOT DETECTED
Methadone Scn, Ur: NOT DETECTED
Opiate, Ur Screen: NOT DETECTED
Phencyclidine (PCP) Ur S: NOT DETECTED
Tricyclic, Ur Screen: NOT DETECTED

## 2020-09-22 LAB — CBC
HCT: 42 % (ref 39.0–52.0)
Hemoglobin: 13.7 g/dL (ref 13.0–17.0)
MCH: 30.2 pg (ref 26.0–34.0)
MCHC: 32.6 g/dL (ref 30.0–36.0)
MCV: 92.5 fL (ref 80.0–100.0)
Platelets: 293 10*3/uL (ref 150–400)
RBC: 4.54 MIL/uL (ref 4.22–5.81)
RDW: 13.2 % (ref 11.5–15.5)
WBC: 11 10*3/uL — ABNORMAL HIGH (ref 4.0–10.5)
nRBC: 0 % (ref 0.0–0.2)

## 2020-09-22 LAB — BASIC METABOLIC PANEL
Anion gap: 10 (ref 5–15)
BUN: 16 mg/dL (ref 6–20)
CO2: 28 mmol/L (ref 22–32)
Calcium: 8.6 mg/dL — ABNORMAL LOW (ref 8.9–10.3)
Chloride: 99 mmol/L (ref 98–111)
Creatinine, Ser: 0.63 mg/dL (ref 0.61–1.24)
GFR calc Af Amer: >60 mL/min (ref >60)
GFR calc non Af Amer: >60 mL/min (ref >60)
Glucose, Bld: 196 mg/dL — ABNORMAL HIGH (ref 70–99)
Potassium: 4.4 mmol/L (ref 3.5–5.1)
Sodium: 137 mmol/L (ref 135–145)

## 2020-09-22 LAB — URINALYSIS, COMPLETE (UACMP) WITH MICROSCOPIC
Bacteria, UA: NONE SEEN
Bilirubin Urine: NEGATIVE
Glucose, UA: 50 mg/dL — AB
Hgb urine dipstick: NEGATIVE
Ketones, ur: 20 mg/dL — AB
Leukocytes,Ua: NEGATIVE
Nitrite: NEGATIVE
Protein, ur: NEGATIVE mg/dL
Specific Gravity, Urine: 1.042 — ABNORMAL HIGH (ref 1.005–1.030)
Squamous Epithelial / HPF: NONE SEEN (ref 0–5)
WBC, UA: NONE SEEN WBC/hpf (ref 0–5)
pH: 6 (ref 5.0–8.0)

## 2020-09-22 LAB — HIV ANTIBODY (ROUTINE TESTING W REFLEX): HIV Screen 4th Generation wRfx: NONREACTIVE

## 2020-09-22 LAB — FIBRIN DERIVATIVES D-DIMER (ARMC ONLY): Fibrin derivatives D-dimer (ARMC): 1266.52 ng/mL (FEU) — ABNORMAL HIGH (ref 0.00–499.00)

## 2020-09-22 LAB — TROPONIN I (HIGH SENSITIVITY): Troponin I (High Sensitivity): 6 ng/L (ref <18)

## 2020-09-22 LAB — BRAIN NATRIURETIC PEPTIDE: B Natriuretic Peptide: 77.7 pg/mL (ref 0.0–100.0)

## 2020-09-22 LAB — AMMONIA: Ammonia: 29 umol/L (ref 9–35)

## 2020-09-22 LAB — MAGNESIUM: Magnesium: 1.9 mg/dL (ref 1.7–2.4)

## 2020-09-22 MED ORDER — IOHEXOL 350 MG/ML SOLN
100.0000 mL | Freq: Once | INTRAVENOUS | Status: AC | PRN
  Administered 2020-09-22: 100 mL via INTRAVENOUS

## 2020-09-22 MED ORDER — LORAZEPAM 2 MG/ML IJ SOLN
2.0000 mg | Freq: Once | INTRAMUSCULAR | Status: AC
  Administered 2020-09-22: 2 mg via INTRAVENOUS
  Filled 2020-09-22: qty 1

## 2020-09-22 MED ORDER — DM-GUAIFENESIN ER 30-600 MG PO TB12
1.0000 | ORAL_TABLET | Freq: Two times a day (BID) | ORAL | Status: DC
  Administered 2020-09-22 – 2020-09-23 (×3): 1 via ORAL
  Filled 2020-09-22 (×3): qty 1

## 2020-09-22 MED ORDER — IPRATROPIUM-ALBUTEROL 0.5-2.5 (3) MG/3ML IN SOLN
3.0000 mL | Freq: Four times a day (QID) | RESPIRATORY_TRACT | Status: DC
  Administered 2020-09-22 – 2020-09-23 (×4): 3 mL via RESPIRATORY_TRACT
  Filled 2020-09-22 (×4): qty 3

## 2020-09-22 MED ORDER — METHYLPREDNISOLONE SODIUM SUCC 125 MG IJ SOLR
60.0000 mg | Freq: Two times a day (BID) | INTRAMUSCULAR | Status: DC
  Administered 2020-09-22 – 2020-09-23 (×3): 60 mg via INTRAVENOUS
  Filled 2020-09-22 (×4): qty 2

## 2020-09-22 NOTE — Plan of Care (Signed)

## 2020-09-22 NOTE — Hospital Course (Signed)
Eric Lyons is a 54 y.o. male with medical history significant of COPD on 3-4 L/min oxygen at home in addition to BiPAP at night and as needed throughout the day, asthma, anxiety, active drug abuse, who presented to the ED on 09/21/20 with SOB, cough and chest tightness in the setting of smoking crack cocaine the night before, "more than usual".  No fever/chills.   Patient has RLE cast in place and states his ankle has been painful, but it is improving from recent ankle fusion surgery.    Evaluation in the ED - afebrile, HR 90's, RR 20's, soft BP.  Labs notable for leukocytosis 16.2k, normal lactic acid, normal troponin x 2, negative Covid-19 PCR.  Chest xray negative.  Head CT negative.    Admitted to hospitalist service for further management of COPD/asthma exacerbation brought on by his substance abuse.

## 2020-09-22 NOTE — Progress Notes (Signed)
PROGRESS NOTE    SENDER RUEB   ZOX:[id number]  DOB: January 15, 1966  PCP: [facility]    DOA: 09/21/2020 LOS: 0   Brief Narrative   Eric Lyons is a 54 y.o. male with medical history significant of COPD on 3-4 L/min oxygen at home in addition to BiPAP at night and as needed throughout the day, asthma, anxiety, active drug abuse, who presented to the ED on 09/21/20 with SOB, cough and chest tightness in the setting of smoking crack cocaine the night before, "more than usual".  No fever/chills.   Patient has RLE cast in place and states his ankle has been painful, but it is improving from recent ankle fusion surgery.    Evaluation in the ED - afebrile, HR 90's, RR 20's, soft BP.  Labs notable for leukocytosis 16.2k, normal lactic acid, normal troponin x 2, negative Covid-19 PCR.  Chest xray negative.  Head CT negative.    Admitted to hospitalist service for further management of COPD/asthma exacerbation brought on by his substance abuse.     Assessment & Plan   Principal Problem:   COPD with acute exacerbation (HCC) Active Problems:   Asthma exacerbation   Toxic encephalopathy   S/P ankle fusion   Drug overdose   SIRS (systemic inflammatory response syndrome) (HCC)   Abdominal pain   COPD with acute exacerbation / Asthma with acute exacerbation - present on admission with shortness of breath, cough and wheezing.  Suspect triggered by patient's smoking crack cocaine.  Treated with high dose IV steroids and bronchodilators in the ED.   9/24: still with poor aeration and wheezing --Resume IV steroids - Solumedrol 60 mg IV BID --Schedule Duonebs q6h --PRN albuterol HFA --Mucinex BID --follow cultures  Chronic respiratory failure with hypoxia - baseline oxygen requirement is 3-4 L/min, BiPAP at night and as needed during the day.   --Supplemental o2, maintain O2 sat> 88% --BiPAP hs/prn   SIRS - presented with leukocytosis, HR>90, RR>20, normal lactic acid.   No source of infection was identified.  SIRS secondary to COPD/asthma exacerbation.  Monitor CBC and vitals.  Follow culture.   Acute toxic encephalopathy - present on admission, due to crack cocaine use.  Resolved.  Monitor.   Right ankle pain s/p recent fusion surgery - remains in hard cast, nonweightbearing.  Xray on admission negative for fracture. As needed analgesics.  PT evaluation.     Substance abuse - counseled on necessity of cessation for his overall health.   UDS on admission positive for benzo's, cocaine and cannabis.  Abdominal pain - nonspecific, reported on admission, resolved.  Monitor.   Morbid Obesity: Body mass index is 40.77 kg/m.  Complicates overall care and prognosis. Counseled on lifestyle modifications for weight loss and overall health.   Unvaccinated for Covid-19 - counseled extensively on importance especially given his underlying lung disease and oxygen dependence.  Patient believes vaccines are killing people and causing severe side effects.  Educated on safety data and risk to his health and life if he contracted Covid-19.  He stated would think about it but "it's a touchy subject".   DVT prophylaxis: enoxaparin (LOVENOX) injection 40 mg Start: 09/21/20 2200   Diet:  Diet Orders (From admission, onward)    Start     Ordered   09/21/20 2002  Diet regular Room service appropriate? Yes; Fluid consistency: Thin  Diet effective now       Question Answer Comment  Room service appropriate? Yes   Fluid  consistency: Thin      09/21/20 2001            Code Status: Full Code    Subjective 09/22/20    Patient seen this AM and reports breathing somewhat better.  Asks for BiPAP, uses at night and as needed daytime and says he'd be wearing it now.  States not vaccinated for Covid-19 because the shots are killing people, causing facial paralysis etc.   Complains of some right ankle pain since he fell and is supposed to be non-weightbearing.  Reviewed xray  done in the ED which showed no complications.   Disposition Plan & Communication   Status is: Inpatient  Remains inpatient appropriate because:IV treatments appropriate due to intensity of illness or inability to take PO   Dispo: The patient is from: Home              Anticipated d/c is to: Home              Anticipated d/c date is: 1 day              Patient currently is not medically stable to d/c.        Family Communication: none at bedside, will attempt to call     Consults, Procedures, Significant Events   Consultants:   None  Procedures:   None  Antimicrobials:  Anti-infectives (From admission, onward)   Start     Dose/Rate Route Frequency Ordered Stop   09/21/20 1000  doxycycline (VIBRA-TABS) tablet 100 mg  Status:  Discontinued        100 mg Oral  Once 09/21/20 0946 09/21/20 1307   09/21/20 0000  doxycycline (VIBRAMYCIN) 100 MG capsule        100 mg Oral 2 times daily 09/21/20 1009 09/28/20 2359         Objective   Vitals:   09/22/20 0812 09/22/20 0939 09/22/20 1103 09/22/20 1354  BP:   134/83   Pulse: 82  87 95  Resp: 14  18   Temp:   98.5 F (36.9 C)   TempSrc:   Oral   SpO2: 94% 91% 96% 91%  Weight:      Height:        Intake/Output Summary (Last 24 hours) at 09/22/2020 1430 Last data filed at 09/22/2020 0935 Gross per 24 hour  Intake 360 ml  Output 0 ml  Net 360 ml   Filed Weights   09/21/20 0015 09/22/20 0601  Weight: 129.3 kg 104.4 kg    Physical Exam:  General exam: awake, alert, no acute distress Respiratory system: poor aeration, diminished bases, scattered expiratory wheezes, normal respiratory effort on 3 L/min oxygen by nasal cannula. Cardiovascular system: normal S1/S2, RRR, no pedal edema.   Gastrointestinal system: protuberant/distended, nontender, +bowel sounds. Central nervous system: A&O x3. no gross focal neurologic deficits, normal speech Extremities: RLE in hard cast, distal sensation intact Psychiatry: normal  mood, congruent affect  Labs   Data Reviewed: I have personally reviewed following labs and imaging studies  CBC: Recent Labs  Lab 09/21/20 0019 09/22/20 0329  WBC 16.2* 11.0*  HGB 15.1 13.7  HCT 45.7 42.0  MCV 90.3 92.5  PLT 335 293   Basic Metabolic Panel: Recent Labs  Lab 09/21/20 0019 09/22/20 0329  NA 134* 137  K 4.1 4.4  CL 93* 99  CO2 28 28  GLUCOSE 180* 196*  BUN 14 16  CREATININE 0.90 0.63  CALCIUM 9.5 8.6*   GFR: Estimated Creatinine  Clearance: 112 mL/min (by C-G formula based on SCr of 0.63 mg/dL). Liver Function Tests: Recent Labs  Lab 09/21/20 0019  AST 24  ALT 27  ALKPHOS 76  BILITOT 1.6*  PROT 8.3*  ALBUMIN 4.4   Recent Labs  Lab 09/21/20 0219  LIPASE 22   Recent Labs  Lab 09/22/20 0329  AMMONIA 29   Coagulation Profile: No results for input(s): INR, PROTIME in the last 168 hours. Cardiac Enzymes: Recent Labs  Lab 09/21/20 1213  CKTOTAL 184   BNP (last 3 results) No results for input(s): PROBNP in the last 8760 hours. HbA1C: No results for input(s): HGBA1C in the last 72 hours. CBG: No results for input(s): GLUCAP in the last 168 hours. Lipid Profile: No results for input(s): CHOL, HDL, LDLCALC, TRIG, CHOLHDL, LDLDIRECT in the last 72 hours. Thyroid Function Tests: No results for input(s): TSH, T4TOTAL, FREET4, T3FREE, THYROIDAB in the last 72 hours. Anemia Panel: No results for input(s): VITAMINB12, FOLATE, FERRITIN, TIBC, IRON, RETICCTPCT in the last 72 hours. Sepsis Labs: Recent Labs  Lab 09/21/20 1213 09/21/20 1614  LATICACIDVEN 1.1 1.3    Recent Results (from the past 240 hour(s))  Blood culture (routine x 2)     Status: None (Preliminary result)   Collection Time: 09/21/20 12:13 PM   Specimen: BLOOD  Result Value Ref Range Status   Specimen Description BLOOD L AC  Final   Special Requests   Final    BOTTLES DRAWN AEROBIC AND ANAEROBIC Blood Culture adequate volume   Culture   Final    NO GROWTH < 24  HOURS Performed at [facility], [address] [place], [place] [address]    Report Status PENDING  Incomplete  Blood culture (routine x 2)     Status: None (Preliminary result)   Collection Time: 09/21/20 12:13 PM   Specimen: BLOOD  Result Value Ref Range Status   Specimen Description BLOOD [facility] AC  Final   Special Requests   Final    BOTTLES DRAWN AEROBIC AND ANAEROBIC Blood Culture adequate volume   Culture   Final    NO GROWTH < 24 HOURS Performed at [facility], [address] [place], [place] [address]    Report Status PENDING  Incomplete  Respiratory Panel by RT PCR (Flu A&B, Covid) - Nasopharyngeal Swab     Status: None   Collection Time: 09/21/20  4:14 PM   Specimen: Nasopharyngeal Swab  Result Value Ref Range Status   SARS Coronavirus 2 by RT PCR NEGATIVE NEGATIVE Final    Comment: (NOTE) SARS-CoV-2 target nucleic acids are NOT DETECTED.  The SARS-CoV-2 RNA is generally detectable in upper respiratoy specimens during the acute phase of infection. The lowest concentration of SARS-CoV-2 viral copies this assay can detect is 131 copies/mL. A negative result does not preclude SARS-Cov-2 infection and should not be used as the sole basis for treatment or other patient management decisions. A negative result may occur with  improper specimen collection/handling, submission of specimen other than nasopharyngeal swab, presence of viral mutation(s) within the areas targeted by this assay, and inadequate number of viral copies (<131 copies/mL). A negative result must be combined with clinical observations, patient history, and epidemiological information. The expected result is Negative.  Fact Sheet for Patients:  https://www.moore.com/  Fact Sheet for Healthcare Providers:  https://www.young.biz/  This test is no t yet approved or cleared by the [place] FDA and  has been authorized for detection and/or  diagnosis of SARS-CoV-2 by  FDA under an Emergency Use Authorization (EUA). This EUA will remain  in effect (meaning this test can be used) for the duration of the COVID-19 declaration under Section 564(b)(1) of the Act, 21 U.S.C. section 360bbb-3(b)(1), unless the authorization is terminated or revoked sooner.     Influenza A by PCR NEGATIVE NEGATIVE Final   Influenza B by PCR NEGATIVE NEGATIVE Final    Comment: (NOTE) The Xpert Xpress SARS-CoV-2/FLU/RSV assay is intended as an aid in  the diagnosis of influenza from Nasopharyngeal swab specimens and  should not be used as a sole basis for treatment. Nasal washings and  aspirates are unacceptable for Xpert Xpress SARS-CoV-2/FLU/RSV  testing.  Fact Sheet for Patients: https://www.moore.com/  Fact Sheet for Healthcare Providers: https://www.young.biz/  This test is not yet approved or cleared by the [place] FDA and  has been authorized for detection and/or diagnosis of SARS-CoV-2 by  FDA under an Emergency Use Authorization (EUA). This EUA will remain  in effect (meaning this test can be used) for the duration of the  Covid-19 declaration under Section 564(b)(1) of the Act, 21  U.S.C. section 360bbb-3(b)(1), unless the authorization is  terminated or revoked. Performed at [facility], [address], [place], [place] [address]       Imaging Studies   DG Ankle Complete Right  Result Date: 09/21/2020 CLINICAL DATA:  54 year old male with red and painful right ankle status post recent surgery. EXAM: RIGHT ANKLE - COMPLETE 3+ VIEW COMPARISON:  11/30/2018 FINDINGS: Overlying casting material limits fine osseous and soft tissue detail. Postsurgical changes after calcaneotalar and calcanealotibial fusion with multiple calcaneal fixation screws in place. Unchanged appearance of previously visualized anterior tibiotalar plate and screw apparatus. Chronic fracture deformity of the  distal tibia. There is generalized right lower extremity edema, similar to comparison. No radiopaque foreign bodies. IMPRESSION: Postsurgical changes after calcaneo-talar and calcaneo-tibial fixation without complicating features. Overlying casting material limits fine osseous and soft tissue detail. Electronically Signed   By: Marliss Coots MD   On: 09/21/2020 13:45   CT Head Wo Contrast  Result Date: 09/21/2020 CLINICAL DATA:  Delirium.  Heroin use. EXAM: CT HEAD WITHOUT CONTRAST TECHNIQUE: Contiguous axial images were obtained from the base of the skull through the vertex without intravenous contrast. COMPARISON:  None. FINDINGS: Brain: No evidence of acute large vascular territory infarction, hemorrhage, hydrocephalus, extra-axial collection or mass lesion/mass effect. Evaluation of the posterior fossa is limited by streak artifact. Vascular: No hyperdense vessel or unexpected calcification. Skull: Normal. Negative for fracture or focal lesion. Sinuses/Orbits: No acute finding. Other: No mastoid effusion. IMPRESSION: No evidence of acute intracranial abnormality. Electronically Signed   By: Feliberto Harts MD   On: 09/21/2020 13:48   CT ABDOMEN PELVIS W CONTRAST  Result Date: 09/21/2020 CLINICAL DATA:  Abdominal pain and fever.  Leukocytosis. EXAM: CT ABDOMEN AND PELVIS WITH CONTRAST TECHNIQUE: Multidetector CT imaging of the abdomen and pelvis was performed using the standard protocol following bolus administration of intravenous contrast. CONTRAST:  OMNIPAQUE IOHEXOL 300 MG/ML  SOLN COMPARISON:  CT dated December 31, 2016. FINDINGS: Lower chest: There is a 6 mm pulmonary nodule in the left lower lobe (axial series 4, image 7). This is stable since 2014 and is consistent with a benign process.The heart size is normal. Hepatobiliary: There is decreased hepatic attenuation suggestive of hepatic steatosis. Normal gallbladder.There is no biliary ductal dilation. Pancreas: Normal contours without  ductal dilatation. No peripancreatic fluid collection. Spleen: The spleen is enlarged measuring 12.7  cm craniocaudad Adrenals/Urinary Tract: --Adrenal glands: Unremarkable. --Right kidney/ureter: No hydronephrosis or radiopaque kidney stones. --Left kidney/ureter: No hydronephrosis or radiopaque kidney stones. --Urinary bladder: Unremarkable. Stomach/Bowel: --Stomach/Duodenum: No hiatal hernia or other gastric abnormality. Normal duodenal course and caliber. --Small bowel: Unremarkable. --Colon: Rectosigmoid diverticulosis without acute inflammation. --Appendix: Normal. Vascular/Lymphatic: Normal course and caliber of the major abdominal vessels. --No retroperitoneal lymphadenopathy. --No mesenteric lymphadenopathy. --No pelvic or inguinal lymphadenopathy. Reproductive: Unremarkable Other: No ascites or free air. The abdominal wall is normal. Musculoskeletal. No acute displaced fractures. IMPRESSION: 1. No acute abdominopelvic abnormality. 2. Hepatic steatosis. 3. Splenomegaly. 4. Rectosigmoid diverticulosis without acute inflammation. Electronically Signed   By: Katherine Mantle M.D.   On: 09/21/2020 17:02   DG Chest Portable 1 View  Result Date: 09/21/2020 CLINICAL DATA:  Shortness of breath EXAM: PORTABLE CHEST 1 VIEW COMPARISON:  02/22/2019 FINDINGS: The heart size and mediastinal contours are stable. Chronic mildly prominent interstitial markings. No focal airspace consolidation, pleural effusion, or pneumothorax. The visualized skeletal structures are unremarkable. IMPRESSION: No active disease.  Chronic bronchitic type lung changes. Electronically Signed   By: Duanne Guess D.O.   On: 09/21/2020 10:05     Medications   Scheduled Meds: . enoxaparin (LOVENOX) injection  40 mg Subcutaneous BID  . ipratropium-albuterol  3 mL Nebulization Q6H  . methylPREDNISolone (SOLU-MEDROL) injection  60 mg Intravenous BID  . nicotine  21 mg Transdermal Daily  . sertraline  50 mg Oral Daily   Continuous  Infusions:     LOS: 0 days    Time spent: 30 minutes    Pennie Banter, DO [facility]  09/22/2020, 2:30 PM    If 7PM-7AM, please contact night-coverage. How to contact the [facility] Attending or Consulting provider 7A - 7P or covering provider during after hours 7P -7A, for this patient?    1. Check the care team in [facility] and look for a) attending/consulting [facility] provider listed and b) the [facility] team listed 2. Log into [email or web address] and use [facility]'s universal password to access. If you do not have the password, please contact the hospital operator. 3. Locate the [facility] provider you are looking for under [facility] and page to a number that you can be directly reached. 4. If you still have difficulty reaching the provider, please page the [facility] (Director on Call) for the Hospitalists listed on amion for assistance.

## 2020-09-22 NOTE — ED Notes (Addendum)
This RN woke patient up at this time to give meds and get blood. Patient is alert & oriented at this, able to hold a conversation with this RN about events that happened leading to this hospitalization. Pt denies pain, appears slightly shob at this time but agrees to breathing treatment. Pt pleasant and comfortable in bed, no needs noted. Male purwick in place.

## 2020-09-22 NOTE — Progress Notes (Signed)
Cross Cover Note Patient complained of chest pressure 10/10 shortness of breath and anxiety.  Patient verbalizes chronic anxiety in which he sometimes uses CBD gummies at home to help. He is unable to determine if his current chest discomfort and shortness of bredath came before or after his anxiety level felt high.  Respirations slightly noted to have increased work and short.  History significant for recent RLE ankle surgery with immobilization, active smoker with current COPD exacerbation and high steroid treatment, and substance abuse disorder.   Troponin 6, bnp normal fibrin derivatives significantly elevated WELLS score - moderate risk for PE CTA PE negative PE, atelectasis vs mucous plugging, bronchial thickening, overall consistent with COPD exacerbation

## 2020-09-23 LAB — CBC
HCT: 40.3 % (ref 39.0–52.0)
Hemoglobin: 13.7 g/dL (ref 13.0–17.0)
MCH: 30.4 pg (ref 26.0–34.0)
MCHC: 34 g/dL (ref 30.0–36.0)
MCV: 89.6 fL (ref 80.0–100.0)
Platelets: 309 10*3/uL (ref 150–400)
RBC: 4.5 MIL/uL (ref 4.22–5.81)
RDW: 13.1 % (ref 11.5–15.5)
WBC: 13 10*3/uL — ABNORMAL HIGH (ref 4.0–10.5)
nRBC: 0 % (ref 0.0–0.2)

## 2020-09-23 MED ORDER — ALBUTEROL SULFATE HFA 108 (90 BASE) MCG/ACT IN AERS: 2.0000 | INHALATION_SPRAY | RESPIRATORY_TRACT | 1 refills | Status: DC | PRN

## 2020-09-23 MED ORDER — DOXYCYCLINE HYCLATE 100 MG PO CAPS: 100.0000 mg | ORAL_CAPSULE | Freq: Two times a day (BID) | ORAL | 0 refills | Status: AC

## 2020-09-23 MED ORDER — NICOTINE 21 MG/24HR TD PT24: 21.0000 mg | MEDICATED_PATCH | Freq: Every day | TRANSDERMAL | 0 refills | Status: DC

## 2020-09-23 MED ORDER — PREDNISONE 10 MG PO TABS: ORAL_TABLET | ORAL | 0 refills | Status: AC

## 2020-09-23 MED ORDER — PREDNISONE 10 MG PO TABS: ORAL_TABLET | ORAL | 0 refills | Status: DC

## 2020-09-23 MED ORDER — DM-GUAIFENESIN ER 30-600 MG PO TB12: 1.0000 | ORAL_TABLET | Freq: Two times a day (BID) | ORAL | 0 refills | Status: AC

## 2020-09-23 MED ORDER — DM-GUAIFENESIN ER 30-600 MG PO TB12: 1.0000 | ORAL_TABLET | Freq: Two times a day (BID) | ORAL | 0 refills | Status: DC

## 2020-09-23 MED ORDER — ALBUTEROL SULFATE HFA 108 (90 BASE) MCG/ACT IN AERS: 2.0000 | INHALATION_SPRAY | RESPIRATORY_TRACT | 1 refills | Status: AC | PRN

## 2020-09-23 NOTE — Evaluation (Signed)
Physical Therapy Evaluation Patient Details Name: Eric Lyons MRN: [id number] DOB: [birth year] Today's Date: 09/23/2020   History of Present Illness  54 yo male with onset of drug overdose was admitted and reports he used more than usual of crack.  Pt was off his O2 which requires 3L at baseline.  Had R ankle fusion recently.  PMHx:  R ankle fusion from a fracture with hard cast NWB, COPD, hepatic steatosis, splenomegaly, diverticulosis, bronchitis, heroin use   Clinical Impression  Pt is leaving potentially today, but would benefit from a home therapy visit to assess his safety.  He is likely to decline this, but would still recommend to get him back to his PLOF.  RW may be needed if pt does not have one with good repair.  Follow acutely as he is likely to decline the home therapy to focus on balance and endurance with translation to gait safety.  May review knee walker if time permits.    Follow Up Recommendations Home health PT    Equipment Recommendations  Rolling walker with 5" wheels    Recommendations for Other Services       Precautions / Restrictions Precautions Precautions: Fall Precaution Comments: has used many gait devices Required Braces or Orthoses: Splint/Cast Splint/Cast: hard cast R ankle Restrictions Weight Bearing Restrictions: Yes RLE Weight Bearing: Non weight bearing      Mobility  Bed Mobility Overal bed mobility: Modified Independent                Transfers Overall transfer level: Modified independent Equipment used: Rolling walker (2 wheeled)                Ambulation/Gait Ambulation/Gait assistance: Min guard Gait Distance (Feet): 40 Feet Assistive device: Rolling walker (2 wheeled)   Gait velocity: reduced Gait velocity interpretation: <1.31 ft/sec, indicative of household ambulator General Gait Details: hopping on RW, was able to stand side of bed with no AD as well  Stairs            Wheelchair Mobility     Modified Rankin (Stroke Patients Only)       Balance Overall balance assessment: Needs assistance Sitting-balance support: Feet supported Sitting balance-Leahy Scale: Good     Standing balance support: Bilateral upper extremity supported;During functional activity Standing balance-Leahy Scale: Poor                               Pertinent Vitals/Pain Pain Assessment: Faces Faces Pain Scale: Hurts a little bit Pain Location: R ankle Pain Descriptors / Indicators: Operative site guarding Pain Intervention(s): Monitored during session;Repositioned    Home Living Family/patient expects to be discharged to:: Private residence Living Arrangements: Other relatives Available Help at Discharge: Friend(s);Family;Available PRN/intermittently Type of Home: Apartment Home Access: Level entry     Home Layout: One level Home Equipment: Walker - 2 wheels;Cane - single point;Crutches;Other (comment) (knee walker) Additional Comments: Pt has the equipment needed    Prior Function Level of Independence: Independent with assistive device(s)               Hand Dominance   Dominant Hand: Right    Extremity/Trunk Assessment   Upper Extremity Assessment Upper Extremity Assessment: Overall WFL for tasks assessed    Lower Extremity Assessment Lower Extremity Assessment: RLE deficits/detail RLE Deficits / Details: NWB on cast and has to work to keep up on RW RLE Coordination: decreased gross motor    Cervical /  Trunk Assessment Cervical / Trunk Assessment: Normal  Communication   Communication: No difficulties  Cognition Arousal/Alertness: Awake/alert Behavior During Therapy: WFL for tasks assessed/performed Overall Cognitive Status: Within Functional Limits for tasks assessed                                        General Comments General comments (skin integrity, edema, etc.): controlled standing balance on RW with good effort to maintain  NWB on RLE.  Knee walker was not used as RW was helpful    Exercises     Assessment/Plan    PT Assessment Patient needs continued PT services  PT Problem List Decreased strength;Decreased range of motion;Decreased mobility;Decreased balance;Decreased activity tolerance;Decreased knowledge of use of DME;Pain;Decreased skin integrity       PT Treatment Interventions DME instruction;Gait training;Functional mobility training;Therapeutic activities;Therapeutic exercise;Balance training;Neuromuscular re-education;Patient/family education    PT Goals (Current goals can be found in the Care Plan section)  Acute Rehab PT Goals Patient Stated Goal: to get home and continue to recover PT Goal Formulation: With patient Time For Goal Achievement: 09/30/20 Potential to Achieve Goals: Good    Frequency Min 2X/week   Barriers to discharge Decreased caregiver support help at home most all of the time    Co-evaluation               AM-PAC PT "6 Clicks" Mobility  Outcome Measure Help needed turning from your back to your side while in a flat bed without using bedrails?: None Help needed moving from lying on your back to sitting on the side of a flat bed without using bedrails?: None Help needed moving to and from a bed to a chair (including a wheelchair)?: A Little Help needed standing up from a chair using your arms (e.g., wheelchair or bedside chair)?: A Little Help needed to walk in hospital room?: A Little Help needed climbing 3-5 steps with a railing? : Total 6 Click Score: 18    End of Session Equipment Utilized During Treatment: Gait belt Activity Tolerance: Patient tolerated treatment well;Treatment limited secondary to medical complications (Comment) Patient left: in bed;with call bell/phone within reach;with bed alarm set Nurse Communication: Mobility status PT Visit Diagnosis: Unsteadiness on feet (R26.81)    Time: 6578-4696 PT Time Calculation (min) (ACUTE ONLY): 30  min   Charges:             Ivar Drape 09/23/2020, 4:53 PM  Samul Dada, PT MS Acute Rehab Dept. Number: [facility] [id number] and [facility] [phone]

## 2020-09-23 NOTE — Plan of Care (Signed)
Pt dc per MD order, dc instruction gone over with pt, no acute distress noted. All questions answered.    Problem: Education: Goal: Knowledge of General Education information will improve Description: Including pain rating scale, medication(s)/side effects and non-pharmacologic comfort measures Outcome: Adequate for Discharge   Problem: Health Behavior/Discharge Planning: Goal: Ability to manage health-related needs will improve Outcome: Adequate for Discharge   Problem: Clinical Measurements: Goal: Ability to maintain clinical measurements within normal limits will improve Outcome: Adequate for Discharge Goal: Will remain free from infection Outcome: Adequate for Discharge Goal: Diagnostic test results will improve Outcome: Adequate for Discharge Goal: Respiratory complications will improve Outcome: Adequate for Discharge Goal: Cardiovascular complication will be avoided Outcome: Adequate for Discharge

## 2020-09-23 NOTE — Discharge Summary (Signed)
Physician Discharge Summary  Eric Lyons NWG:[id number] DOB: 1966-10-10 DOA: 09/21/2020  PCP: [facility]  Admit date: 09/21/2020 Discharge date: 09/23/2020  Admitted From: home Disposition:  home  Recommendations for Outpatient Follow-up:  1. Follow up with PCP in 1-2 weeks 2. Please obtain BMP/CBC in one week    Home Health: No  Equipment/Devices: None   Discharge Condition: Stable  CODE STATUS: Full  Diet recommendation: Heart Healthy    Discharge Diagnoses: Principal Problem:   COPD with acute exacerbation (HCC) Active Problems:   Asthma exacerbation   Toxic encephalopathy   S/P ankle fusion   Drug overdose   SIRS (systemic inflammatory response syndrome) (HCC)   Abdominal pain    Summary of HPI and Hospital Course:  Eric Lyons is a 54 y.o. male with medical history significant of COPD on 3-4 L/min oxygen at home in addition to BiPAP at night and as needed throughout the day, asthma, anxiety, active drug abuse, who presented to the ED on 09/21/20 with SOB, cough and chest tightness in the setting of smoking crack cocaine the night before, "more than usual".  No fever/chills.   Patient has RLE cast in place and states his ankle has been painful, but it is improving from recent ankle fusion surgery.    Evaluation in the ED - afebrile, HR 90's, RR 20's, soft BP.  Labs notable for leukocytosis 16.2k, normal lactic acid, normal troponin x 2, negative Covid-19 PCR.  Chest xray negative.  Head CT negative.    Admitted to hospitalist service for further management of COPD/asthma exacerbation brought on by his substance abuse.     COPD with acute exacerbation / Asthma with acute exacerbation - present on admission with shortness of breath, cough and wheezing.  Suspect triggered by patient's smoking crack cocaine.  Treated with high dose IV steroids and bronchodilators in the ED.    9/24: still with poor aeration and wheezing 9/25: improved aeration  without wheezing --Treated with IV steroids, scheduled Duonebs q6h, PRN albuterol HFA, Mucinex BID --Discharge with prednisone taper over 4 days Close PCP follow up   Chronic respiratory failure with hypoxia - baseline oxygen requirement is 3-4 L/min, BiPAP at night and as needed during the day.   --Supplemental o2, maintain O2 sat> 88% --BiPAP hs/prn   SIRS - presented with leukocytosis, HR>90, RR>20, normal lactic acid.  No source of infection was identified.  SIRS secondary to COPD/asthma exacerbation.  Cultures negative to date.  Acute toxic encephalopathy - present on admission, due to crack cocaine use.  Resolved with time and supportive care.  Right ankle pain s/p recent fusion surgery - remains in hard cast, nonweightbearing.  Xray on admission negative for fracture. As needed analgesics.     Substance abuse - counseled on necessity of cessation for his overall health.   UDS on admission positive for benzo's, cocaine and cannabis.  Abdominal pain - nonspecific, reported on admission, resolved.  Monitor.  Morbid Obesity: Body mass index is 40.77 kg/m.  Complicates overall care and prognosis. Counseled on lifestyle modifications for weight loss and overall health.  Unvaccinated for Covid-19 - counseled extensively on importance especially given his underlying lung disease and oxygen dependence.  Patient believes vaccines are killing people and causing severe side effects.  Educated on safety data and risk to his health and life if he contracted Covid-19.  He stated would think about it but "it's a touchy subject".   Discharge Instructions   Discharge Instructions  Call MD for:   Complete by: As directed    Increasing need for more oxygen than your normal 3-4 L/min.   Worsening shortness of breath or wheezing.   Call MD for:  extreme fatigue   Complete by: As directed    Call MD for:  persistant dizziness or light-headedness   Complete by: As directed     Call MD for:  temperature >100.4   Complete by: As directed    Diet - low sodium heart healthy   Complete by: As directed    Discharge instructions   Complete by: As directed    Please take Prednisone taper as prescribed over next 4 days.  Call your primary care office on Monday AM to schedule close follow up appointment.  Please avoid ALL illicit drugs and smoking of ANY kind.  I prescribed nicotine patches for you and primary care can continue those or discuss other medications to help you stop smoking.   Increase activity slowly   Complete by: As directed      Allergies as of 09/23/2020      Reactions   Peanut-containing Drug Products    Fentanyl Rash   Prednisone Other (See Comments)   Irritable      Medication List    TAKE these medications   albuterol 108 (90 Base) MCG/ACT inhaler Commonly known as: VENTOLIN HFA Inhale 2 puffs into the lungs every 4 (four) hours as needed for wheezing or shortness of breath. What changed: when to take this   dextromethorphan-guaiFENesin 30-600 MG 12hr tablet Commonly known as: MUCINEX DM Take 1 tablet by mouth 2 (two) times daily for 7 days.   doxycycline 100 MG capsule Commonly known as: VIBRAMYCIN Take 1 capsule (100 mg total) by mouth 2 (two) times daily for 7 days.   fluticasone 50 MCG/ACT nasal spray Commonly known as: FLONASE INSTILL 2 SPRAYS INTO EACH NOSTRIL ONCE DAILY   ipratropium-albuterol 0.5-2.5 (3) MG/3ML Soln Commonly known as: DUONEB Take 3 mLs by nebulization every 6 (six) hours. DX: COPD DX Code: J44.9   Combivent Respimat 20-100 MCG/ACT Aers respimat Generic drug: Ipratropium-Albuterol INHALE 2 PUFFS BY MOUTH FOUR TIMES DAILY   naproxen 500 MG tablet Commonly known as: Naprosyn Take 1 tablet (500 mg total) by mouth 2 (two) times daily with a meal.   nicotine 21 mg/24hr patch Commonly known as: NICODERM CQ - dosed in mg/24 hours Place 1 patch (21 mg total) onto the skin daily. Start taking on: September 24, 2020   predniSONE 10 MG tablet Commonly known as: DELTASONE Take 4 tablets (40 mg total) by mouth daily for 1 day, THEN 3 tablets (30 mg total) daily for 1 day, THEN 2 tablets (20 mg total) daily for 1 day, THEN 1 tablet (10 mg total) daily for 1 day. Start taking on: September 23, 2020 What changed: See the new instructions.   sertraline 50 MG tablet Commonly known as: ZOLOFT Take 50 mg by mouth daily.   Spiriva HandiHaler 18 MCG inhalation capsule Generic drug: tiotropium Place 1 capsule into inhaler and inhale daily.   terbinafine 250 MG tablet Commonly known as: LAMISIL Take 250 mg by mouth daily.       Follow-up Information    [place] In 3 days.   Why: For repeat exam and check-up Contact information: [address] [place] [address] [place] [id number] [phone]        Orthopedist.   Contact information: Follow-up with your Orthopedist in the next week  to look at your cast/skin changes             Allergies  Allergen Reactions  . Peanut-Containing Drug Products   . Fentanyl Rash  . Prednisone Other (See Comments)    Irritable    Consultations:  none    Procedures/Studies: DG Chest 1 View  Result Date: 09/22/2020 CLINICAL DATA:  Chest tightness, COPD EXAM: CHEST  1 VIEW COMPARISON:  09/21/2020 FINDINGS: Single frontal view of the chest was obtained, evaluation limited by overlying cardiac monitor. Cardiac silhouette is grossly normal in size. No airspace disease, effusion, or pneumothorax. No acute bony abnormalities. Stable hypoventilatory changes at the left lung base. IMPRESSION: 1. Stable exam, no acute process. Electronically Signed   By: Sharlet Salina M.D.   On: 09/22/2020 20:16   DG Ankle Complete Right  Result Date: 09/21/2020 CLINICAL DATA:  54 year old male with red and painful right ankle status post recent surgery. EXAM: RIGHT ANKLE - COMPLETE 3+ VIEW COMPARISON:  11/30/2018 FINDINGS: Overlying casting material limits fine  osseous and soft tissue detail. Postsurgical changes after calcaneotalar and calcanealotibial fusion with multiple calcaneal fixation screws in place. Unchanged appearance of previously visualized anterior tibiotalar plate and screw apparatus. Chronic fracture deformity of the distal tibia. There is generalized right lower extremity edema, similar to comparison. No radiopaque foreign bodies. IMPRESSION: Postsurgical changes after calcaneo-talar and calcaneo-tibial fixation without complicating features. Overlying casting material limits fine osseous and soft tissue detail. Electronically Signed   By: Marliss Coots MD   On: 09/21/2020 13:45   CT Head Wo Contrast  Result Date: 09/21/2020 CLINICAL DATA:  Delirium.  Heroin use. EXAM: CT HEAD WITHOUT CONTRAST TECHNIQUE: Contiguous axial images were obtained from the base of the skull through the vertex without intravenous contrast. COMPARISON:  None. FINDINGS: Brain: No evidence of acute large vascular territory infarction, hemorrhage, hydrocephalus, extra-axial collection or mass lesion/mass effect. Evaluation of the posterior fossa is limited by streak artifact. Vascular: No hyperdense vessel or unexpected calcification. Skull: Normal. Negative for fracture or focal lesion. Sinuses/Orbits: No acute finding. Other: No mastoid effusion. IMPRESSION: No evidence of acute intracranial abnormality. Electronically Signed   By: Feliberto Harts MD   On: 09/21/2020 13:48   CT ANGIO CHEST PE W OR WO CONTRAST  Result Date: 09/22/2020 CLINICAL DATA:  PE suspected, COPD EXAM: CT ANGIOGRAPHY CHEST WITH CONTRAST TECHNIQUE: Multidetector CT imaging of the chest was performed using the standard protocol during bolus administration of intravenous contrast. Multiplanar CT image reconstructions and MIPs were obtained to evaluate the vascular anatomy. CONTRAST:  OMNIPAQUE IOHEXOL 350 MG/ML SOLN COMPARISON:  Radiograph 09/22/2020, CT 11/04/2013 FINDINGS: Cardiovascular:  Satisfactory opacification the pulmonary arteries to the segmental level. No pulmonary artery filling defects are identified. Central pulmonary arteries are normal caliber. Cardiac size is at the upper limits of normal. No pericardial effusion. The aortic root and ascending aorta is suboptimally assessed given cardiac pulsation artifact. The aorta is normal caliber. No acute luminal abnormality of the imaged aorta. No periaortic stranding or hemorrhage. Normal 3 vessel branching of the aortic arch. Proximal great vessels are unremarkable. Mediastinum/Nodes: No mediastinal fluid or gas. Normal thyroid gland and thoracic inlet. No acute abnormality of the trachea or esophagus. No worrisome mediastinal, hilar or axillary adenopathy. Lungs/Pleura: Volume loss with vascular crowding and some bandlike opacity in the left lung base favoring atelectasis and/or scarring. With some mild airways thickening and scattered secretions. No consolidation, features of edema, pneumothorax, or effusion. No suspicious pulmonary nodules or masses. Upper  Abdomen: No acute abnormalities present in the visualized portions of the upper abdomen. Musculoskeletal: Multilevel degenerative changes are present in the imaged portions of the spine. Additional mild degenerative changes in the shoulders. No chest wall mass or suspicious bone lesions identified. Review of the MIP images confirms the above findings. IMPRESSION: 1. No evidence of acute pulmonary artery filling defects to suggest pulmonary embolism. 2. Volume loss with vascular crowding and some bandlike opacity in the left lung base favoring atelectasis and/or scarring though may be the result of some mucous plugging or secretions given mildly thickened airways. Electronically Signed   By: Kreg Shropshire M.D.   On: 09/22/2020 22:31   CT ABDOMEN PELVIS W CONTRAST  Result Date: 09/21/2020 CLINICAL DATA:  Abdominal pain and fever.  Leukocytosis. EXAM: CT ABDOMEN AND PELVIS WITH CONTRAST  TECHNIQUE: Multidetector CT imaging of the abdomen and pelvis was performed using the standard protocol following bolus administration of intravenous contrast. CONTRAST:  OMNIPAQUE IOHEXOL 300 MG/ML  SOLN COMPARISON:  CT dated December 31, 2016. FINDINGS: Lower chest: There is a 6 mm pulmonary nodule in the left lower lobe (axial series 4, image 7). This is stable since 2014 and is consistent with a benign process.The heart size is normal. Hepatobiliary: There is decreased hepatic attenuation suggestive of hepatic steatosis. Normal gallbladder.There is no biliary ductal dilation. Pancreas: Normal contours without ductal dilatation. No peripancreatic fluid collection. Spleen: The spleen is enlarged measuring 12.7 cm craniocaudad Adrenals/Urinary Tract: --Adrenal glands: Unremarkable. --Right kidney/ureter: No hydronephrosis or radiopaque kidney stones. --Left kidney/ureter: No hydronephrosis or radiopaque kidney stones. --Urinary bladder: Unremarkable. Stomach/Bowel: --Stomach/Duodenum: No hiatal hernia or other gastric abnormality. Normal duodenal course and caliber. --Small bowel: Unremarkable. --Colon: Rectosigmoid diverticulosis without acute inflammation. --Appendix: Normal. Vascular/Lymphatic: Normal course and caliber of the major abdominal vessels. --No retroperitoneal lymphadenopathy. --No mesenteric lymphadenopathy. --No pelvic or inguinal lymphadenopathy. Reproductive: Unremarkable Other: No ascites or free air. The abdominal wall is normal. Musculoskeletal. No acute displaced fractures. IMPRESSION: 1. No acute abdominopelvic abnormality. 2. Hepatic steatosis. 3. Splenomegaly. 4. Rectosigmoid diverticulosis without acute inflammation. Electronically Signed   By: Katherine Mantle M.D.   On: 09/21/2020 17:02   DG Chest Portable 1 View  Result Date: 09/21/2020 CLINICAL DATA:  Shortness of breath EXAM: PORTABLE CHEST 1 VIEW COMPARISON:  02/22/2019 FINDINGS: The heart size and mediastinal contours are  stable. Chronic mildly prominent interstitial markings. No focal airspace consolidation, pleural effusion, or pneumothorax. The visualized skeletal structures are unremarkable. IMPRESSION: No active disease.  Chronic bronchitic type lung changes. Electronically Signed   By: Duanne Guess D.O.   On: 09/21/2020 10:05       Subjective: Patient seen at bedside.  Says he's feeling better.  Wheezing improved.  Breathing feels at his baseline.     Discharge Exam: Vitals:   09/23/20 0441 09/23/20 0745  BP: 132/82 (!) 147/89  Pulse: 78 81  Resp: 19 18  Temp: 97.6 F (36.4 C) (!) 97.5 F (36.4 C)  SpO2: (!) 89% 96%   Vitals:   09/23/20 0228 09/23/20 0440 09/23/20 0441 09/23/20 0745  BP:   132/82 (!) 147/89  Pulse:   78 81  Resp:   19 18  Temp:   97.6 F (36.4 C) (!) 97.5 F (36.4 C)  TempSrc:   Oral Oral  SpO2: 93%  (!) 89% 96%  Weight:  114.9 kg    Height:        General: Pt is alert, awake, not in acute distress, morbidly obese Cardiovascular:  RRR, S1/S2 +, no rubs, no gallops Respiratory: overall diminished with no wheezing, no rhonchi Abdominal: Soft, NT, ND, bowel sounds + Extremities: no edema, no cyanosis, RLE in hard cast distal sensation intact    The results of significant diagnostics from this hospitalization (including imaging, microbiology, ancillary and laboratory) are listed below for reference.     Microbiology: Recent Results (from the past 240 hour(s))  Blood culture (routine x 2)     Status: None (Preliminary result)   Collection Time: 09/21/20 12:13 PM   Specimen: BLOOD  Result Value Ref Range Status   Specimen Description BLOOD L AC  Final   Special Requests   Final    BOTTLES DRAWN AEROBIC AND ANAEROBIC Blood Culture adequate volume   Culture   Final    NO GROWTH 2 DAYS Performed at [facility], [address], [place], [place] [address]    Report Status PENDING  Incomplete  Blood culture (routine x 2)     Status: None (Preliminary  result)   Collection Time: 09/21/20 12:13 PM   Specimen: BLOOD  Result Value Ref Range Status   Specimen Description BLOOD [facility]  Final   Special Requests   Final    BOTTLES DRAWN AEROBIC AND ANAEROBIC Blood Culture adequate volume   Culture   Final    NO GROWTH 2 DAYS Performed at [facility], [address] [place], [place] [address]    Report Status PENDING  Incomplete  Respiratory Panel by RT PCR (Flu A&B, Covid) - Nasopharyngeal Swab     Status: None   Collection Time: 09/21/20  4:14 PM   Specimen: Nasopharyngeal Swab  Result Value Ref Range Status   SARS Coronavirus 2 by RT PCR NEGATIVE NEGATIVE Final    Comment: (NOTE) SARS-CoV-2 target nucleic acids are NOT DETECTED.  The SARS-CoV-2 RNA is generally detectable in upper respiratoy specimens during the acute phase of infection. The lowest concentration of SARS-CoV-2 viral copies this assay can detect is 131 copies/mL. A negative result does not preclude SARS-Cov-2 infection and should not be used as the sole basis for treatment or other patient management decisions. A negative result may occur with  improper specimen collection/handling, submission of specimen other than nasopharyngeal swab, presence of viral mutation(s) within the areas targeted by this assay, and inadequate number of viral copies (<131 copies/mL). A negative result must be combined with clinical observations, patient history, and epidemiological information. The expected result is Negative.  Fact Sheet for Patients:  https://www.moore.com/  Fact Sheet for Healthcare Providers:  https://www.young.biz/  This test is no t yet approved or cleared by the [place] FDA and  has been authorized for detection and/or diagnosis of SARS-CoV-2 by FDA under an Emergency Use Authorization (EUA). This EUA will remain  in effect (meaning this test can be used) for the duration of the COVID-19 declaration under  Section 564(b)(1) of the Act, 21 U.S.C. section 360bbb-3(b)(1), unless the authorization is terminated or revoked sooner.     Influenza A by PCR NEGATIVE NEGATIVE Final   Influenza B by PCR NEGATIVE NEGATIVE Final    Comment: (NOTE) The Xpert Xpress SARS-CoV-2/FLU/RSV assay is intended as an aid in  the diagnosis of influenza from Nasopharyngeal swab specimens and  should not be used as a sole basis for treatment. Nasal washings and  aspirates are unacceptable for Xpert Xpress SARS-CoV-2/FLU/RSV  testing.  Fact Sheet for Patients: https://www.moore.com/  Fact Sheet for Healthcare Providers: https://www.young.biz/  This test is not yet  approved or cleared by the [place] and  has been authorized for detection and/or diagnosis of SARS-CoV-2 by  FDA under an Emergency Use Authorization (EUA). This EUA will remain  in effect (meaning this test can be used) for the duration of the  Covid-19 declaration under Section 564(b)(1) of the Act, 21  U.S.C. section 360bbb-3(b)(1), unless the authorization is  terminated or revoked. Performed at [facility], [address] [place], [place] [address]      Labs: BNP (last 3 results) Recent Labs    09/22/20 2025  BNP 77.7   Basic Metabolic Panel: Recent Labs  Lab 09/21/20 0019 09/22/20 0329 09/22/20 2025  NA 134* 137  --   K 4.1 4.4  --   CL 93* 99  --   CO2 28 28  --   GLUCOSE 180* 196*  --   BUN 14 16  --   CREATININE 0.90 0.63  --   CALCIUM 9.5 8.6*  --   MG  --   --  1.9   Liver Function Tests: Recent Labs  Lab 09/21/20 0019  AST 24  ALT 27  ALKPHOS 76  BILITOT 1.6*  PROT 8.3*  ALBUMIN 4.4   Recent Labs  Lab 09/21/20 0219  LIPASE 22   Recent Labs  Lab 09/22/20 0329  AMMONIA 29   CBC: Recent Labs  Lab 09/21/20 0019 09/22/20 0329 09/23/20 0422  WBC 16.2* 11.0* 13.0*  HGB 15.1 13.7 13.7  HCT 45.7 42.0 40.3  MCV 90.3 92.5 89.6  PLT 335 293  309   Cardiac Enzymes: Recent Labs  Lab 09/21/20 1213  CKTOTAL 184   BNP: Invalid input(s): POCBNP CBG: No results for input(s): GLUCAP in the last 168 hours. D-Dimer No results for input(s): DDIMER in the last 72 hours. Hgb A1c No results for input(s): HGBA1C in the last 72 hours. Lipid Profile No results for input(s): CHOL, HDL, LDLCALC, TRIG, CHOLHDL, LDLDIRECT in the last 72 hours. Thyroid function studies No results for input(s): TSH, T4TOTAL, T3FREE, THYROIDAB in the last 72 hours.  Invalid input(s): FREET3 Anemia work up No results for input(s): VITAMINB12, FOLATE, FERRITIN, TIBC, IRON, RETICCTPCT in the last 72 hours. Urinalysis    Component Value Date/Time   COLORURINE YELLOW (A) 09/22/2020 0329   APPEARANCEUR CLEAR (A) 09/22/2020 0329   LABSPEC 1.042 (H) 09/22/2020 0329   PHURINE 6.0 09/22/2020 0329   GLUCOSEU 50 (A) 09/22/2020 0329   HGBUR NEGATIVE 09/22/2020 0329   BILIRUBINUR NEGATIVE 09/22/2020 0329   KETONESUR 20 (A) 09/22/2020 0329   PROTEINUR NEGATIVE 09/22/2020 0329   NITRITE NEGATIVE 09/22/2020 0329   LEUKOCYTESUR NEGATIVE 09/22/2020 0329   Sepsis Labs Invalid input(s): PROCALCITONIN,  WBC,  LACTICIDVEN Microbiology Recent Results (from the past 240 hour(s))  Blood culture (routine x 2)     Status: None (Preliminary result)   Collection Time: 09/21/20 12:13 PM   Specimen: BLOOD  Result Value Ref Range Status   Specimen Description BLOOD L AC  Final   Special Requests   Final    BOTTLES DRAWN AEROBIC AND ANAEROBIC Blood Culture adequate volume   Culture   Final    NO GROWTH 2 DAYS Performed at [facility], [address] [place], [place] [address]    Report Status PENDING  Incomplete  Blood culture (routine x 2)     Status: None (Preliminary result)   Collection Time: 09/21/20 12:13 PM   Specimen: BLOOD  Result Value Ref Range Status  Specimen Description BLOOD [facility] AC  Final   Special Requests   Final    BOTTLES DRAWN  AEROBIC AND ANAEROBIC Blood Culture adequate volume   Culture   Final    NO GROWTH 2 DAYS Performed at [facility], [address] [place], [place] [address]    Report Status PENDING  Incomplete  Respiratory Panel by RT PCR (Flu A&B, Covid) - Nasopharyngeal Swab     Status: None   Collection Time: 09/21/20  4:14 PM   Specimen: Nasopharyngeal Swab  Result Value Ref Range Status   SARS Coronavirus 2 by RT PCR NEGATIVE NEGATIVE Final    Comment: (NOTE) SARS-CoV-2 target nucleic acids are NOT DETECTED.  The SARS-CoV-2 RNA is generally detectable in upper respiratoy specimens during the acute phase of infection. The lowest concentration of SARS-CoV-2 viral copies this assay can detect is 131 copies/mL. A negative result does not preclude SARS-Cov-2 infection and should not be used as the sole basis for treatment or other patient management decisions. A negative result may occur with  improper specimen collection/handling, submission of specimen other than nasopharyngeal swab, presence of viral mutation(s) within the areas targeted by this assay, and inadequate number of viral copies (<131 copies/mL). A negative result must be combined with clinical observations, patient history, and epidemiological information. The expected result is Negative.  Fact Sheet for Patients:  https://www.moore.com/  Fact Sheet for Healthcare Providers:  https://www.young.biz/  This test is no t yet approved or cleared by the [place] FDA and  has been authorized for detection and/or diagnosis of SARS-CoV-2 by FDA under an Emergency Use Authorization (EUA). This EUA will remain  in effect (meaning this test can be used) for the duration of the COVID-19 declaration under Section 564(b)(1) of the Act, 21 U.S.C. section 360bbb-3(b)(1), unless the authorization is terminated or revoked sooner.     Influenza A by PCR NEGATIVE NEGATIVE Final   Influenza  B by PCR NEGATIVE NEGATIVE Final    Comment: (NOTE) The Xpert Xpress SARS-CoV-2/FLU/RSV assay is intended as an aid in  the diagnosis of influenza from Nasopharyngeal swab specimens and  should not be used as a sole basis for treatment. Nasal washings and  aspirates are unacceptable for Xpert Xpress SARS-CoV-2/FLU/RSV  testing.  Fact Sheet for Patients: https://www.moore.com/  Fact Sheet for Healthcare Providers: https://www.young.biz/  This test is not yet approved or cleared by the [place] FDA and  has been authorized for detection and/or diagnosis of SARS-CoV-2 by  FDA under an Emergency Use Authorization (EUA). This EUA will remain  in effect (meaning this test can be used) for the duration of the  Covid-19 declaration under Section 564(b)(1) of the Act, 21  U.S.C. section 360bbb-3(b)(1), unless the authorization is  terminated or revoked. Performed at [facility], [address] [place], [place] [address]      Time coordinating discharge: Over 30 minutes  SIGNED:   Pennie Banter, DO [facility] 09/23/2020, 9:59 AM   If 7PM-7AM, please contact [email or web address] [email or web address]

## 2020-09-26 LAB — CULTURE, BLOOD (ROUTINE X 2)
Culture: NO GROWTH
Culture: NO GROWTH
Special Requests: ADEQUATE
Special Requests: ADEQUATE

## 2020-09-27 DIAGNOSIS — Z1211 Encounter for screening for malignant neoplasm of colon: Secondary | ICD-10-CM | POA: Diagnosis not present

## 2020-09-27 DIAGNOSIS — F419 Anxiety disorder, unspecified: Secondary | ICD-10-CM | POA: Diagnosis not present

## 2020-09-27 DIAGNOSIS — Z136 Encounter for screening for cardiovascular disorders: Secondary | ICD-10-CM | POA: Diagnosis not present

## 2020-09-27 DIAGNOSIS — Z0000 Encounter for general adult medical examination without abnormal findings: Secondary | ICD-10-CM | POA: Diagnosis not present

## 2020-10-08 DIAGNOSIS — J449 Chronic obstructive pulmonary disease, unspecified: Secondary | ICD-10-CM | POA: Diagnosis not present

## 2020-10-09 DIAGNOSIS — M19071 Primary osteoarthritis, right ankle and foot: Secondary | ICD-10-CM | POA: Diagnosis not present

## 2020-10-30 DIAGNOSIS — Z981 Arthrodesis status: Secondary | ICD-10-CM | POA: Diagnosis not present

## 2020-10-30 DIAGNOSIS — J449 Chronic obstructive pulmonary disease, unspecified: Secondary | ICD-10-CM | POA: Diagnosis not present

## 2020-10-30 DIAGNOSIS — M19071 Primary osteoarthritis, right ankle and foot: Secondary | ICD-10-CM | POA: Diagnosis not present

## 2020-10-30 DIAGNOSIS — J45909 Unspecified asthma, uncomplicated: Secondary | ICD-10-CM | POA: Diagnosis not present

## 2020-10-30 DIAGNOSIS — G4733 Obstructive sleep apnea (adult) (pediatric): Secondary | ICD-10-CM | POA: Diagnosis not present

## 2020-10-30 DIAGNOSIS — M19171 Post-traumatic osteoarthritis, right ankle and foot: Secondary | ICD-10-CM | POA: Diagnosis not present

## 2020-11-08 DIAGNOSIS — J449 Chronic obstructive pulmonary disease, unspecified: Secondary | ICD-10-CM | POA: Diagnosis not present

## 2020-11-13 DIAGNOSIS — Z5189 Encounter for other specified aftercare: Secondary | ICD-10-CM | POA: Diagnosis not present

## 2020-11-23 DIAGNOSIS — F319 Bipolar disorder, unspecified: Secondary | ICD-10-CM | POA: Diagnosis not present

## 2020-11-23 DIAGNOSIS — Z6841 Body Mass Index (BMI) 40.0 and over, adult: Secondary | ICD-10-CM | POA: Diagnosis not present

## 2020-11-23 DIAGNOSIS — T8149XA Infection following a procedure, other surgical site, initial encounter: Secondary | ICD-10-CM | POA: Diagnosis not present

## 2020-11-23 DIAGNOSIS — S91301D Unspecified open wound, right foot, subsequent encounter: Secondary | ICD-10-CM | POA: Diagnosis not present

## 2020-11-23 DIAGNOSIS — J449 Chronic obstructive pulmonary disease, unspecified: Secondary | ICD-10-CM | POA: Diagnosis not present

## 2020-11-23 DIAGNOSIS — T8459XA Infection and inflammatory reaction due to other internal joint prosthesis, initial encounter: Secondary | ICD-10-CM | POA: Diagnosis not present

## 2020-11-23 DIAGNOSIS — B999 Unspecified infectious disease: Secondary | ICD-10-CM | POA: Diagnosis not present

## 2020-11-23 DIAGNOSIS — Z981 Arthrodesis status: Secondary | ICD-10-CM | POA: Diagnosis not present

## 2020-11-23 DIAGNOSIS — F419 Anxiety disorder, unspecified: Secondary | ICD-10-CM | POA: Diagnosis not present

## 2020-11-23 DIAGNOSIS — S91301A Unspecified open wound, right foot, initial encounter: Secondary | ICD-10-CM | POA: Diagnosis not present

## 2020-11-23 DIAGNOSIS — Z20822 Contact with and (suspected) exposure to covid-19: Secondary | ICD-10-CM | POA: Diagnosis not present

## 2020-11-23 DIAGNOSIS — A498 Other bacterial infections of unspecified site: Secondary | ICD-10-CM | POA: Diagnosis not present

## 2020-11-23 DIAGNOSIS — T8189XA Other complications of procedures, not elsewhere classified, initial encounter: Secondary | ICD-10-CM | POA: Diagnosis not present

## 2020-11-23 DIAGNOSIS — Z7982 Long term (current) use of aspirin: Secondary | ICD-10-CM | POA: Diagnosis not present

## 2020-11-23 DIAGNOSIS — Z885 Allergy status to narcotic agent status: Secondary | ICD-10-CM | POA: Diagnosis not present

## 2020-11-23 DIAGNOSIS — Z79899 Other long term (current) drug therapy: Secondary | ICD-10-CM | POA: Diagnosis not present

## 2020-11-24 DIAGNOSIS — S91301D Unspecified open wound, right foot, subsequent encounter: Secondary | ICD-10-CM | POA: Diagnosis not present

## 2020-11-24 DIAGNOSIS — T8149XA Infection following a procedure, other surgical site, initial encounter: Secondary | ICD-10-CM | POA: Diagnosis not present

## 2020-11-24 DIAGNOSIS — A498 Other bacterial infections of unspecified site: Secondary | ICD-10-CM | POA: Diagnosis not present

## 2020-11-25 DIAGNOSIS — T8189XA Other complications of procedures, not elsewhere classified, initial encounter: Secondary | ICD-10-CM | POA: Diagnosis not present

## 2020-11-25 DIAGNOSIS — Z981 Arthrodesis status: Secondary | ICD-10-CM | POA: Diagnosis not present

## 2020-11-27 DIAGNOSIS — A498 Other bacterial infections of unspecified site: Secondary | ICD-10-CM | POA: Diagnosis not present

## 2020-11-27 DIAGNOSIS — S91301D Unspecified open wound, right foot, subsequent encounter: Secondary | ICD-10-CM | POA: Diagnosis not present

## 2020-11-27 DIAGNOSIS — T8149XA Infection following a procedure, other surgical site, initial encounter: Secondary | ICD-10-CM | POA: Diagnosis not present

## 2020-11-28 DIAGNOSIS — B999 Unspecified infectious disease: Secondary | ICD-10-CM | POA: Diagnosis not present

## 2020-11-28 DIAGNOSIS — T8149XA Infection following a procedure, other surgical site, initial encounter: Secondary | ICD-10-CM | POA: Diagnosis not present

## 2020-12-02 DIAGNOSIS — B999 Unspecified infectious disease: Secondary | ICD-10-CM | POA: Diagnosis not present

## 2020-12-02 DIAGNOSIS — T8149XA Infection following a procedure, other surgical site, initial encounter: Secondary | ICD-10-CM | POA: Diagnosis not present

## 2020-12-05 DIAGNOSIS — T8149XA Infection following a procedure, other surgical site, initial encounter: Secondary | ICD-10-CM | POA: Diagnosis not present

## 2020-12-05 DIAGNOSIS — B999 Unspecified infectious disease: Secondary | ICD-10-CM | POA: Diagnosis not present

## 2020-12-07 DIAGNOSIS — B999 Unspecified infectious disease: Secondary | ICD-10-CM | POA: Diagnosis not present

## 2020-12-07 DIAGNOSIS — T8149XA Infection following a procedure, other surgical site, initial encounter: Secondary | ICD-10-CM | POA: Diagnosis not present

## 2020-12-08 DIAGNOSIS — J449 Chronic obstructive pulmonary disease, unspecified: Secondary | ICD-10-CM | POA: Diagnosis not present

## 2020-12-09 DIAGNOSIS — B999 Unspecified infectious disease: Secondary | ICD-10-CM | POA: Diagnosis not present

## 2020-12-09 DIAGNOSIS — T8149XA Infection following a procedure, other surgical site, initial encounter: Secondary | ICD-10-CM | POA: Diagnosis not present

## 2020-12-10 DIAGNOSIS — J90 Pleural effusion, not elsewhere classified: Secondary | ICD-10-CM | POA: Diagnosis not present

## 2020-12-10 DIAGNOSIS — T82524A Displacement of infusion catheter, initial encounter: Secondary | ICD-10-CM | POA: Diagnosis not present

## 2020-12-10 DIAGNOSIS — J9811 Atelectasis: Secondary | ICD-10-CM | POA: Diagnosis not present

## 2020-12-10 DIAGNOSIS — Z452 Encounter for adjustment and management of vascular access device: Secondary | ICD-10-CM | POA: Diagnosis not present

## 2020-12-12 DIAGNOSIS — B999 Unspecified infectious disease: Secondary | ICD-10-CM | POA: Diagnosis not present

## 2020-12-12 DIAGNOSIS — T8149XA Infection following a procedure, other surgical site, initial encounter: Secondary | ICD-10-CM | POA: Diagnosis not present

## 2020-12-15 DIAGNOSIS — M86171 Other acute osteomyelitis, right ankle and foot: Secondary | ICD-10-CM | POA: Diagnosis not present

## 2020-12-15 DIAGNOSIS — Z23 Encounter for immunization: Secondary | ICD-10-CM | POA: Diagnosis not present

## 2020-12-15 DIAGNOSIS — T8149XA Infection following a procedure, other surgical site, initial encounter: Secondary | ICD-10-CM | POA: Diagnosis not present

## 2020-12-15 DIAGNOSIS — B999 Unspecified infectious disease: Secondary | ICD-10-CM | POA: Diagnosis not present

## 2020-12-15 DIAGNOSIS — Z792 Long term (current) use of antibiotics: Secondary | ICD-10-CM | POA: Diagnosis not present

## 2020-12-19 DIAGNOSIS — T8149XA Infection following a procedure, other surgical site, initial encounter: Secondary | ICD-10-CM | POA: Diagnosis not present

## 2020-12-19 DIAGNOSIS — B999 Unspecified infectious disease: Secondary | ICD-10-CM | POA: Diagnosis not present

## 2020-12-20 DIAGNOSIS — M19071 Primary osteoarthritis, right ankle and foot: Secondary | ICD-10-CM | POA: Diagnosis not present

## 2020-12-23 DIAGNOSIS — T8149XA Infection following a procedure, other surgical site, initial encounter: Secondary | ICD-10-CM | POA: Diagnosis not present

## 2020-12-23 DIAGNOSIS — B999 Unspecified infectious disease: Secondary | ICD-10-CM | POA: Diagnosis not present

## 2020-12-26 DIAGNOSIS — T8149XA Infection following a procedure, other surgical site, initial encounter: Secondary | ICD-10-CM | POA: Diagnosis not present

## 2020-12-26 DIAGNOSIS — B999 Unspecified infectious disease: Secondary | ICD-10-CM | POA: Diagnosis not present

## 2020-12-29 DIAGNOSIS — T8149XA Infection following a procedure, other surgical site, initial encounter: Secondary | ICD-10-CM | POA: Diagnosis not present

## 2020-12-29 DIAGNOSIS — B999 Unspecified infectious disease: Secondary | ICD-10-CM | POA: Diagnosis not present

## 2021-01-01 DIAGNOSIS — Z8371 Family history of colonic polyps: Secondary | ICD-10-CM | POA: Diagnosis not present

## 2021-01-01 DIAGNOSIS — Z6841 Body Mass Index (BMI) 40.0 and over, adult: Secondary | ICD-10-CM | POA: Diagnosis not present

## 2021-01-02 DIAGNOSIS — T8149XA Infection following a procedure, other surgical site, initial encounter: Secondary | ICD-10-CM | POA: Diagnosis not present

## 2021-01-02 DIAGNOSIS — B999 Unspecified infectious disease: Secondary | ICD-10-CM | POA: Diagnosis not present

## 2021-01-03 DIAGNOSIS — M86171 Other acute osteomyelitis, right ankle and foot: Secondary | ICD-10-CM | POA: Diagnosis not present

## 2021-01-03 DIAGNOSIS — Z452 Encounter for adjustment and management of vascular access device: Secondary | ICD-10-CM | POA: Diagnosis not present

## 2021-01-03 DIAGNOSIS — Z23 Encounter for immunization: Secondary | ICD-10-CM | POA: Diagnosis not present

## 2021-01-03 DIAGNOSIS — J449 Chronic obstructive pulmonary disease, unspecified: Secondary | ICD-10-CM | POA: Diagnosis not present

## 2021-01-03 DIAGNOSIS — K219 Gastro-esophageal reflux disease without esophagitis: Secondary | ICD-10-CM | POA: Diagnosis not present

## 2021-01-03 DIAGNOSIS — Z792 Long term (current) use of antibiotics: Secondary | ICD-10-CM | POA: Diagnosis not present

## 2021-01-03 DIAGNOSIS — E669 Obesity, unspecified: Secondary | ICD-10-CM | POA: Diagnosis not present

## 2021-01-05 DIAGNOSIS — T8149XA Infection following a procedure, other surgical site, initial encounter: Secondary | ICD-10-CM | POA: Diagnosis not present

## 2021-01-05 DIAGNOSIS — B999 Unspecified infectious disease: Secondary | ICD-10-CM | POA: Diagnosis not present

## 2021-01-08 DIAGNOSIS — J449 Chronic obstructive pulmonary disease, unspecified: Secondary | ICD-10-CM | POA: Diagnosis not present

## 2021-01-10 DIAGNOSIS — M19071 Primary osteoarthritis, right ankle and foot: Secondary | ICD-10-CM | POA: Diagnosis not present

## 2021-02-07 DIAGNOSIS — G4733 Obstructive sleep apnea (adult) (pediatric): Secondary | ICD-10-CM | POA: Diagnosis not present

## 2021-02-07 DIAGNOSIS — M19071 Primary osteoarthritis, right ankle and foot: Secondary | ICD-10-CM | POA: Diagnosis not present

## 2021-02-07 DIAGNOSIS — J45909 Unspecified asthma, uncomplicated: Secondary | ICD-10-CM | POA: Diagnosis not present

## 2021-02-07 DIAGNOSIS — J449 Chronic obstructive pulmonary disease, unspecified: Secondary | ICD-10-CM | POA: Diagnosis not present

## 2021-02-07 DIAGNOSIS — M85871 Other specified disorders of bone density and structure, right ankle and foot: Secondary | ICD-10-CM | POA: Diagnosis not present

## 2021-02-08 DIAGNOSIS — J449 Chronic obstructive pulmonary disease, unspecified: Secondary | ICD-10-CM | POA: Diagnosis not present

## 2021-03-08 DIAGNOSIS — J449 Chronic obstructive pulmonary disease, unspecified: Secondary | ICD-10-CM | POA: Diagnosis not present

## 2021-03-14 ENCOUNTER — Other Ambulatory Visit: Payer: Medicare HMO | Attending: Gastroenterology

## 2021-04-04 DIAGNOSIS — F333 Major depressive disorder, recurrent, severe with psychotic symptoms: Secondary | ICD-10-CM | POA: Diagnosis not present

## 2021-04-08 DIAGNOSIS — J449 Chronic obstructive pulmonary disease, unspecified: Secondary | ICD-10-CM | POA: Diagnosis not present

## 2021-04-23 DIAGNOSIS — F333 Major depressive disorder, recurrent, severe with psychotic symptoms: Secondary | ICD-10-CM | POA: Diagnosis not present

## 2021-05-08 DIAGNOSIS — J449 Chronic obstructive pulmonary disease, unspecified: Secondary | ICD-10-CM | POA: Diagnosis not present

## 2021-05-25 ENCOUNTER — Ambulatory Visit: Admission: RE | Admit: 2021-05-25 | Payer: Medicare HMO | Source: Home / Self Care

## 2021-05-25 ENCOUNTER — Encounter: Admission: RE | Payer: Self-pay | Source: Home / Self Care

## 2021-05-25 SURGERY — COLONOSCOPY WITH PROPOFOL
Anesthesia: General

## 2021-05-29 DIAGNOSIS — F333 Major depressive disorder, recurrent, severe with psychotic symptoms: Secondary | ICD-10-CM | POA: Diagnosis not present

## 2021-06-06 DIAGNOSIS — R600 Localized edema: Secondary | ICD-10-CM | POA: Diagnosis not present

## 2021-06-08 DIAGNOSIS — J449 Chronic obstructive pulmonary disease, unspecified: Secondary | ICD-10-CM | POA: Diagnosis not present

## 2021-07-08 DIAGNOSIS — J449 Chronic obstructive pulmonary disease, unspecified: Secondary | ICD-10-CM | POA: Diagnosis not present

## 2021-07-30 DEATH — deceased
